# Patient Record
Sex: Female | Born: 1950 | Race: White | Hispanic: No | Marital: Single | Smoking: Former smoker
Health system: Southern US, Community
[De-identification: ages and names within clinical notes are randomized; demographics above are authoritative.]

## PROBLEM LIST (undated history)

## (undated) DIAGNOSIS — F419 Anxiety disorder, unspecified: Secondary | ICD-10-CM

## (undated) DIAGNOSIS — E785 Hyperlipidemia, unspecified: Secondary | ICD-10-CM

## (undated) DIAGNOSIS — E119 Type 2 diabetes mellitus without complications: Secondary | ICD-10-CM

## (undated) DIAGNOSIS — R011 Cardiac murmur, unspecified: Secondary | ICD-10-CM

## (undated) DIAGNOSIS — A6920 Lyme disease, unspecified: Secondary | ICD-10-CM

## (undated) DIAGNOSIS — N189 Chronic kidney disease, unspecified: Secondary | ICD-10-CM

## (undated) HISTORY — DX: Type 2 diabetes mellitus without complications: E11.9

## (undated) HISTORY — PX: TONSILLECTOMY: SUR1361

## (undated) HISTORY — PX: DILATION AND CURETTAGE OF UTERUS: SHX78

## (undated) HISTORY — DX: Chronic kidney disease, unspecified: N18.9

## (undated) HISTORY — PX: CATARACT EXTRACTION: SUR2

## (undated) HISTORY — PX: OTHER SURGICAL HISTORY: SHX169

## (undated) HISTORY — DX: Hyperlipidemia, unspecified: E78.5

## (undated) HISTORY — DX: Lyme disease, unspecified: A69.20

## (undated) HISTORY — PX: NASAL SEPTUM SURGERY: SHX37

---

## 1998-05-18 ENCOUNTER — Other Ambulatory Visit: Admission: RE | Admit: 1998-05-18 | Discharge: 1998-05-18 | Payer: Self-pay | Admitting: Obstetrics and Gynecology

## 1999-06-22 ENCOUNTER — Other Ambulatory Visit: Admission: RE | Admit: 1999-06-22 | Discharge: 1999-06-22 | Payer: Self-pay | Admitting: Obstetrics and Gynecology

## 2000-06-26 ENCOUNTER — Other Ambulatory Visit: Admission: RE | Admit: 2000-06-26 | Discharge: 2000-06-26 | Payer: Self-pay | Admitting: Obstetrics and Gynecology

## 2001-02-17 ENCOUNTER — Emergency Department (HOSPITAL_COMMUNITY): Admission: EM | Admit: 2001-02-17 | Discharge: 2001-02-17 | Payer: Self-pay | Admitting: *Deleted

## 2001-06-27 ENCOUNTER — Other Ambulatory Visit: Admission: RE | Admit: 2001-06-27 | Discharge: 2001-06-27 | Payer: Self-pay | Admitting: Obstetrics and Gynecology

## 2002-07-16 ENCOUNTER — Other Ambulatory Visit: Admission: RE | Admit: 2002-07-16 | Discharge: 2002-07-16 | Payer: Self-pay | Admitting: Obstetrics and Gynecology

## 2003-07-24 ENCOUNTER — Other Ambulatory Visit: Admission: RE | Admit: 2003-07-24 | Discharge: 2003-07-24 | Payer: Self-pay | Admitting: Obstetrics and Gynecology

## 2003-11-10 ENCOUNTER — Ambulatory Visit (HOSPITAL_COMMUNITY): Admission: RE | Admit: 2003-11-10 | Discharge: 2003-11-10 | Payer: Self-pay | Admitting: Obstetrics and Gynecology

## 2003-12-16 ENCOUNTER — Ambulatory Visit (HOSPITAL_COMMUNITY): Admission: RE | Admit: 2003-12-16 | Discharge: 2003-12-16 | Payer: Self-pay | Admitting: Obstetrics and Gynecology

## 2004-01-29 ENCOUNTER — Ambulatory Visit (HOSPITAL_COMMUNITY): Admission: RE | Admit: 2004-01-29 | Discharge: 2004-01-29 | Payer: Self-pay | Admitting: Otolaryngology

## 2004-01-29 ENCOUNTER — Ambulatory Visit (HOSPITAL_BASED_OUTPATIENT_CLINIC_OR_DEPARTMENT_OTHER): Admission: RE | Admit: 2004-01-29 | Discharge: 2004-01-29 | Payer: Self-pay | Admitting: Otolaryngology

## 2004-06-10 ENCOUNTER — Ambulatory Visit (HOSPITAL_COMMUNITY): Admission: RE | Admit: 2004-06-10 | Discharge: 2004-06-10 | Payer: Self-pay | Admitting: Obstetrics and Gynecology

## 2004-09-28 ENCOUNTER — Other Ambulatory Visit: Admission: RE | Admit: 2004-09-28 | Discharge: 2004-09-28 | Payer: Self-pay | Admitting: Obstetrics and Gynecology

## 2006-02-19 ENCOUNTER — Emergency Department (HOSPITAL_COMMUNITY): Admission: EM | Admit: 2006-02-19 | Discharge: 2006-02-19 | Payer: Self-pay | Admitting: Emergency Medicine

## 2008-01-03 ENCOUNTER — Ambulatory Visit: Payer: Self-pay | Admitting: Orthopedic Surgery

## 2008-01-03 DIAGNOSIS — S82409A Unspecified fracture of shaft of unspecified fibula, initial encounter for closed fracture: Secondary | ICD-10-CM | POA: Insufficient documentation

## 2008-01-04 ENCOUNTER — Telehealth: Payer: Self-pay | Admitting: Orthopedic Surgery

## 2008-01-07 ENCOUNTER — Telehealth: Payer: Self-pay | Admitting: Orthopedic Surgery

## 2008-01-15 ENCOUNTER — Ambulatory Visit: Payer: Self-pay | Admitting: Orthopedic Surgery

## 2008-01-17 ENCOUNTER — Telehealth: Payer: Self-pay | Admitting: Orthopedic Surgery

## 2008-01-17 ENCOUNTER — Encounter: Payer: Self-pay | Admitting: Orthopedic Surgery

## 2008-01-24 ENCOUNTER — Encounter: Payer: Self-pay | Admitting: Orthopedic Surgery

## 2008-02-18 ENCOUNTER — Ambulatory Visit: Payer: Self-pay | Admitting: Orthopedic Surgery

## 2008-03-18 ENCOUNTER — Ambulatory Visit: Payer: Self-pay | Admitting: Orthopedic Surgery

## 2009-09-25 ENCOUNTER — Encounter: Admission: RE | Admit: 2009-09-25 | Discharge: 2009-09-25 | Payer: Self-pay | Admitting: Obstetrics and Gynecology

## 2010-07-02 NOTE — Op Note (Signed)
Betty Gonzalez, Betty Gonzalez                ACCOUNT NO.:  [id number]   MEDICAL RECORD NO.:  [id number]          PATIENT TYPE:  AMB   LOCATION:  SDC                           FACILITY:  [facility]   PHYSICIAN:  Malva Limes, M.D.    DATE OF BIRTH:  03/27/1950   DATE OF PROCEDURE:  12/16/2003  DATE OF DISCHARGE:                                 OPERATIVE REPORT   PREOPERATIVE DIAGNOSES:  1.  Pelvic pain.  2.  Mass seen on ultrasound in cervical region.  3.  History of postmenopausal bleeding.   POSTOPERATIVE DIAGNOSES:  1.  Pelvic pain.  2.  Mass seen on ultrasound in cervical region.  3.  History of postmenopausal bleeding.   PROCEDURE:  1.  Dilation and curettage.  2.  Hysteroscopy.   SURGEON:  Malva Limes, M.D.   ANESTHESIA:  MAC.   ANTIBIOTICS:  Ancef 1 g.   ESTIMATED BLOOD LOSS:  Minimal.   COMPLICATIONS:  None.   SPECIMENS:  None.   DESCRIPTION OF PROCEDURE:  The patient was taken to the operating room where  she was placed in dorsal lithotomy position, MAC anesthesia was  administered. She was then prepped with Hibiclens and draped in the usual  fashion for this procedure.  A sterile speculum was placed in the vagina, 20  mL of 1% lidocaine was used for paracervical block. The cervix was then  serially dilated to a 77 [place]. The uterus was sounded to 7 cm. The  hysteroscope was advanced to the endocervical canal which appeared to be  normal. On entering the uterine cavity, both ostia were very easily seen,  there was no evidence of any submucosal fibroids. The endometrial lining was  very thin and atrophic. At this point, the hysteroscope was removed, sharp  curettage was performed with no tissue being obtained.  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  This is believed to be what the mass is on  ultrasound.  The patient tolerated the procedure well and she was taken to  the  recovery room in stable condition. She will be discharged home. She will  followup in the office in four weeks.     Mark   MA/MEDQ  D:  12/16/2003  T:  12/16/2003  Job:  [id number]

## 2010-07-02 NOTE — Op Note (Signed)
NAME:  Betty Gonzalez, Betty Gonzalez                ACCOUNT NO.:  [id number]   MEDICAL RECORD NO.:  [id number]          PATIENT TYPE:  AMB   LOCATION:  DSC                          FACILITY:  [facility]   PHYSICIAN:  Jefry H. Pollyann Kennedy, MD     DATE OF BIRTH:  31-May-1950   DATE OF PROCEDURE:  01/29/2004  DATE OF DISCHARGE:                                 OPERATIVE REPORT   PREOPERATIVE DIAGNOSES:  1.  Nasal septal deviation.  2.  Inferior turbinate hypertrophy.   POSTOPERATIVE DIAGNOSES:  1.  Nasal septal deviation.  2.  Inferior turbinate hypertrophy.   OPERATION PERFORMED:  1.  Nasal septoplasty.  2.  Submucous resection of inferior turbinates bilaterally.   SURGEON:  Jefry H. Pollyann Kennedy, MD   ANESTHESIA:  General endotracheal.   COMPLICATIONS:  None.   REFERRING PHYSICIAN:  Ladell Pier, M.D.   ESTIMATED BLOOD LOSS:  50 mL.   FINDINGS:  Caudal cartilaginous septal deviation with protrusion of the  caudal extent of the quadrangular cartilage into the right nasal cavity and  corresponding rightward ethmoid plate deflection toward the right.  There  was spurring along the maxillary crest and anterior vomer on the left side.  The inferior turbinates were elongated and medially displaced causing  partial obstruction as well.   INDICATIONS FOR PROCEDURE:  The patient is a 60 year old lady with a long  history of chronic nasal obstruction.  The risks, benefits, alternatives and  complications of the procedure were explained to the patient, who seemed to  understand and agreed to surgery.   DESCRIPTION OF PROCEDURE:  The patient was taken to the operating room and  placed on the operating table in the supine position.  Following induction  of general endotracheal anesthesia, the patient was prepped and draped in a  standard fashion.  Oxymetazoline spray was used preoperatively in the nasal  cavities.  1% Xylocaine with epinephrine was infiltrated into the septum and  columella and the inferior  turbinates bilaterally.  A total of 5 mm was  injected.  Afrin soaked pledgets were placed in nasal cavities bilaterally.   (1)  Nasal septoplasty.  A left hemitransfixion incision was created with a  15 scalpel and used to initiate a mucoperichondrial flap down the left side.  The bony cartilaginous junction was divided and a similar flap was developed  down the right side.  There was some bleeding from posterior septal mucosa  on the right side that was treated with an Afrin soaked pledget for several  minutes.  The superior attachment of ethmoid plate was resected using open  Jansen-Middleton rongeur and the posterior and inferior attachments were  taken down with Takahashi forceps and large fragments of the deflected  ethmoid plate were resected.  The posterior one third of the quadrangular  cartilage was resected as well and this was also deflected towards the right  side.  The columellar pocket was dissected to retrieve the caudal end of the  quadrangular cartilage and triangular strip was resected  inferiorly about 4  mm in length at its widest point using scissors.  The remaining  caudal  cartilage was replaced back into the columellar pocket and was in much  better position. The [employer] was also used on the caudal cartilage  and soften and release some of the bend.  The septal flaps were then quilted  with 4-0 plain gut and the incision was reapproximated with interrupted  chromic suture.   (2)  Submucous resection of inferior turbinates.  The leading edge of the  inferior turbinates was incised with a 14 scalpel bilaterally.  A Freer  elevator was used to elevate mucosa off the bony turbinates and fragments of  bone were resected bilaterally.  The turbinate remnants were outfractured  with a Therapist, nutritional.  The inferior meatus airways were significantly  increased in size following this maneuver.  The nasal cavities and  nasopharynx were suctioned of blood and  secretions and the nasal cavities  were packed with rolled up Telfa coated with bacitracin ointment.  The  pharynx was suctioned of blood under direction visualization as well.  The  patient was then awakened, extubated and transferred to recovery in stable  condition.      Jefr   JHR/MEDQ  D:  01/29/2004  T:  01/29/2004  Job:  [id number]   cc:   Ladell Pier, M.D.  [address]-Internal Medicine Resident  [place], [place] [address]  Fax: [phone]

## 2011-10-12 ENCOUNTER — Other Ambulatory Visit: Payer: Self-pay | Admitting: Obstetrics and Gynecology

## 2011-10-12 DIAGNOSIS — R234 Changes in skin texture: Secondary | ICD-10-CM

## 2011-10-13 ENCOUNTER — Ambulatory Visit
Admission: RE | Admit: 2011-10-13 | Discharge: 2011-10-13 | Disposition: A | Discharge status: Home / Self Care | Payer: No Typology Code available for payment source | Source: Ambulatory Visit | Attending: Obstetrics and Gynecology | Admitting: Obstetrics and Gynecology

## 2011-10-13 DIAGNOSIS — R234 Changes in skin texture: Secondary | ICD-10-CM

## 2013-06-26 DIAGNOSIS — M810 Age-related osteoporosis without current pathological fracture: Secondary | ICD-10-CM | POA: Insufficient documentation

## 2013-06-26 DIAGNOSIS — L570 Actinic keratosis: Secondary | ICD-10-CM | POA: Insufficient documentation

## 2013-06-26 DIAGNOSIS — J309 Allergic rhinitis, unspecified: Secondary | ICD-10-CM | POA: Insufficient documentation

## 2013-06-26 DIAGNOSIS — R682 Dry mouth, unspecified: Secondary | ICD-10-CM | POA: Insufficient documentation

## 2013-06-26 DIAGNOSIS — F4322 Adjustment disorder with anxiety: Secondary | ICD-10-CM | POA: Insufficient documentation

## 2013-06-26 DIAGNOSIS — IMO0002 Reserved for concepts with insufficient information to code with codable children: Secondary | ICD-10-CM | POA: Insufficient documentation

## 2013-06-26 DIAGNOSIS — E785 Hyperlipidemia, unspecified: Secondary | ICD-10-CM | POA: Insufficient documentation

## 2013-06-26 DIAGNOSIS — H8102 Meniere's disease, left ear: Secondary | ICD-10-CM | POA: Insufficient documentation

## 2013-06-26 DIAGNOSIS — R339 Retention of urine, unspecified: Secondary | ICD-10-CM | POA: Insufficient documentation

## 2013-06-26 DIAGNOSIS — R5383 Other fatigue: Secondary | ICD-10-CM | POA: Insufficient documentation

## 2013-06-26 DIAGNOSIS — N200 Calculus of kidney: Secondary | ICD-10-CM | POA: Insufficient documentation

## 2013-06-26 DIAGNOSIS — Q6432 Congenital stricture of urethra: Secondary | ICD-10-CM | POA: Insufficient documentation

## 2013-06-26 DIAGNOSIS — J019 Acute sinusitis, unspecified: Secondary | ICD-10-CM | POA: Insufficient documentation

## 2013-06-26 DIAGNOSIS — R739 Hyperglycemia, unspecified: Secondary | ICD-10-CM | POA: Insufficient documentation

## 2013-06-26 DIAGNOSIS — E119 Type 2 diabetes mellitus without complications: Secondary | ICD-10-CM | POA: Insufficient documentation

## 2014-08-12 ENCOUNTER — Ambulatory Visit (INDEPENDENT_AMBULATORY_CARE_PROVIDER_SITE_OTHER): Payer: Medicare Other | Admitting: Internal Medicine

## 2014-08-12 ENCOUNTER — Ambulatory Visit (INDEPENDENT_AMBULATORY_CARE_PROVIDER_SITE_OTHER): Payer: Medicare Other

## 2014-08-12 ENCOUNTER — Encounter: Payer: Self-pay | Admitting: Internal Medicine

## 2014-08-12 DIAGNOSIS — R1032 Left lower quadrant pain: Secondary | ICD-10-CM

## 2014-08-12 LAB — POCT CBC
Granulocyte percent: 55 %G (ref 37–80)
HEMATOCRIT: 41.2 % (ref 37.7–47.9)
HEMOGLOBIN: 13.2 g/dL (ref 12.2–16.2)
Lymph, poc: 3.4 (ref 0.6–3.4)
MCH: 28.6 pg (ref 27–31.2)
MCHC: 32.1 g/dL (ref 31.8–35.4)
MCV: 88.9 fL (ref 80–97)
MID (cbc): 0.9 (ref 0–0.9)
MPV: 8.4 fL (ref 0–99.8)
POC Granulocyte: 5.3 (ref 2–6.9)
POC LYMPH PERCENT: 35.3 %L (ref 10–50)
POC MID %: 9.7 % (ref 0–12)
Platelet Count, POC: 250 10*3/uL (ref 142–424)
RBC: 4.63 M/uL (ref 4.04–5.48)
RDW, POC: 13.4 %
WBC: 9.6 10*3/uL (ref 4.6–10.2)

## 2014-08-12 LAB — POCT URINALYSIS DIPSTICK
Bilirubin, UA: NEGATIVE
Blood, UA: NEGATIVE
GLUCOSE UA: NEGATIVE
Ketones, UA: NEGATIVE
Leukocytes, UA: NEGATIVE
NITRITE UA: NEGATIVE
Protein, UA: NEGATIVE
Spec Grav, UA: 1.015
UROBILINOGEN UA: 0.2
pH, UA: 5.5

## 2014-08-12 LAB — POCT UA - MICROSCOPIC ONLY
Casts, Ur, LPF, POC: NEGATIVE
Crystals, Ur, HPF, POC: NEGATIVE
MUCUS UA: NEGATIVE
YEAST UA: NEGATIVE

## 2014-08-12 MED ORDER — POLYETHYLENE GLYCOL 3350 17 GM/SCOOP PO POWD: 17.0000 g | Freq: Every day | ORAL | Status: DC

## 2014-08-12 NOTE — Progress Notes (Signed)
08/12/2014 at 6:35 PM  Betty Gonzalez / DOB: 05-Apr-1950 / MRN: [id number]  The patient has FX CLOSED FIBULA NOS on her problem list.  SUBJECTIVE  Chief complaint: Insect Bite; Abdominal Pain; and Back Pain  Patient here for left lower quadrant pain that started yesterday and the pain is moderate to severe.  Reports she could not lye on her left side last night due to the pain. She has had some mild nausea with the pain.   She denied diarrhea and denies hematochezia and melena.  Reports she has been taking doxycycline for 7 days now for a tic bite.  Is able to tolerate po liquids and solids.  She has not had a colonoscopy in over 15 years.    She  has a past medical history of Chronic kidney disease.    Medications reviewed and updated by myself where necessary, and exist elsewhere in the encounter.   Betty Gonzalez is allergic to codeine; penicillins; and sulfonamide derivatives. She  reports that she has quit smoking. She does not have any smokeless tobacco history on file. She reports that she does not drink alcohol or use illicit drugs. She  has no sexual activity history on file. The patient  has no past surgical history on file.  Her family history includes Dementia in her mother.  Review of Systems  Constitutional: Negative for fever.  Cardiovascular: Negative for chest pain.  Skin: Negative for itching and rash.  Neurological: Negative for headaches.    OBJECTIVE  Her  height is 5' 7.5" (1.715 m) and weight is 173 lb 3.2 oz (78.563 kg). Her oral temperature is 97.5 F (36.4 C). Her blood pressure is 140/78 and her pulse is 81. Her respiration is 18 and oxygen saturation is 98%.  The patient's body mass index is 26.71 kg/(m^2).  Physical Exam  Constitutional: She is oriented to person, place, and time. She appears well-developed and well-nourished. No distress.  Cardiovascular: Normal rate and regular rhythm.   Respiratory: Effort normal and breath sounds normal. No respiratory  distress.  GI: Soft. She exhibits no mass. There is tenderness in the left lower quadrant. There is no rigidity, no rebound, no guarding, no CVA tenderness, no tenderness at McBurney's point and negative Murphy's sign.  Musculoskeletal: Normal range of motion.  Neurological: She is alert and oriented to person, place, and time. No cranial nerve deficit.  Skin: She is not diaphoretic.    Results for orders placed or performed in visit on 08/12/14 (from the past 24 hour(s))  POCT urinalysis dipstick     Status: None   Collection Time: 08/12/14  6:12 PM  Result Value Ref Range   Color, UA yellow    Clarity, UA clear    Glucose, UA neg    Bilirubin, UA neg    Ketones, UA neg    Spec Grav, UA 1.015    Blood, UA neg    pH, UA 5.5    Protein, UA neg    Urobilinogen, UA 0.2    Nitrite, UA neg    Leukocytes, UA Negative Negative  POCT UA - Microscopic Only     Status: None   Collection Time: 08/12/14  6:12 PM  Result Value Ref Range   WBC, Ur, HPF, POC 0-1    RBC, urine, microscopic 0-2    Bacteria, U Microscopic trace    Mucus, UA neg    Epithelial cells, urine per micros 1-3    Crystals, Ur, HPF, POC neg  Casts, Ur, LPF, POC neg    Yeast, UA neg   POCT CBC     Status: None   Collection Time: 08/12/14  6:25 PM  Result Value Ref Range   WBC 9.6 4.6 - 10.2 K/uL   Lymph, poc 3.4 0.6 - 3.4   POC LYMPH PERCENT 35.3 10 - 50 %L   MID (cbc) 0.9 0 - 0.9   POC MID % 9.7 0 - 12 %M   POC Granulocyte 5.3 2 - 6.9   Granulocyte percent 55.0 37 - 80 %G   RBC 4.63 4.04 - 5.48 M/uL   Hemoglobin 13.2 12.2 - 16.2 g/dL   HCT, POC 16.1 09.6 - 47.9 %   MCV 88.9 80 - 97 fL   MCH, POC 28.6 27 - 31.2 pg   MCHC 32.1 31.8 - 35.4 g/dL   RDW, POC 04.5 %   Platelet Count, POC 250 142 - 424 K/uL   MPV 8.4 0 - 99.8 fL   [facility] reading (PRIMARY) by  Dr. Merla Riches: Negative for free air.  Moderate stool burden, otherwise negative.   ASSESSMENT & PLAN  Betty Gonzalez was seen today for insect bite, abdominal  pain and back pain.  Diagnoses and all orders for this visit:  Left lower quadrant pain: Workup within normal limits and CMPGFR and lipase pending.  C-diff unlikely given the absence of diarrhea.  Diverticulitis is possible however her labs do not point in that direction. Advised a clear liquid diet for 24 hours along with Miralax qid.  She is to follow up in the next few days, or go to the ED, if her symptoms do not improve or worsen.   Orders: -     POCT urinalysis dipstick -     POCT UA - Microscopic Only -     Lipase -     COMPLETE METABOLIC PANEL WITH GFR -     Cancel: POCT glycosylated hemoglobin (Hb A1C) -     POCT CBC -     DG Abd 2 Views; Future -     Hemoglobin A1c    The patient was advised to call or come back to clinic if she does not see an improvement in symptoms, or worsens with the above plan.   [name], MHS, PA-C [facility] 08/12/2014 6:35 PM  I have participated in the care of this patient with the Advanced Practice Provider and agree with Diagnosis and Plan as documented. Robert P. Merla Riches, M.D.

## 2014-08-12 NOTE — Patient Instructions (Signed)
Drink copious fluids.  Take Miralax 4 times daily until pain resolves.  Please consume a clear liquid diet for 24 hours.  Clear Liquid   Diet A clear liquid diet is a short-term diet that is prescribed to provide the necessary fluid and basic energy you need when you can have nothing else. The clear liquid diet consists of liquids or solids that will become liquid at room temperature. You should be able to see through the liquid. There are many reasons that you may be restricted to clear liquids, such as:  When you have a sudden-onset (acute) condition that occurs before or after surgery.  To help your body slowly get adjusted to food again after a long period when you were unable to have food.  Replacement of fluids when you have a diarrheal disease.  When you are going to have certain exams, such as a colonoscopy, in which instruments are inserted inside your body to look at parts of your digestive system. WHAT CAN I HAVE? A clear liquid diet does not provide all the nutrients you need. It is important to choose a variety of the following items to get as many nutrients as possible:  Vegetable juices that do not have pulp.  Fruit juices and fruit drinks that do not have pulp.  Coffee (regular or decaffeinated), tea, or soda at the discretion of your health care provider.  Clear bouillon, broth, or strained broth-based soups.  High-protein and flavored gelatins.  Sugar or honey.  Ices or frozen ice pops that do not contain milk. If you are not sure whether you can have certain items, you should ask your health care provider. You may also ask your health care provider if there are any other clear liquid options. Document Released: 01/31/2005 Document Revised: 02/05/2013 Document Reviewed: 12/28/2012 [facility] Patient Information 2015 [employer], [place]. This information is not intended to replace advice given to you by your health care provider. Make sure you discuss any questions you have  with your health care provider.

## 2014-08-13 LAB — HEMOGLOBIN A1C
Hgb A1c MFr Bld: 6.7 % — ABNORMAL HIGH (ref <5.7)
Mean Plasma Glucose: 146 mg/dL — ABNORMAL HIGH (ref <117)

## 2014-08-13 LAB — COMPLETE METABOLIC PANEL WITH GFR
ALBUMIN: 4.1 g/dL (ref 3.5–5.2)
ALK PHOS: 100 U/L (ref 39–117)
ALT: 13 U/L (ref 0–35)
AST: 13 U/L (ref 0–37)
BUN: 15 mg/dL (ref 6–23)
CALCIUM: 9 mg/dL (ref 8.4–10.5)
CHLORIDE: 105 meq/L (ref 96–112)
CO2: 27 meq/L (ref 19–32)
Creat: 0.83 mg/dL (ref 0.50–1.10)
GFR, EST NON AFRICAN AMERICAN: 74 mL/min
GFR, Est African American: 85 mL/min
GLUCOSE: 107 mg/dL — AB (ref 70–99)
POTASSIUM: 4 meq/L (ref 3.5–5.3)
SODIUM: 141 meq/L (ref 135–145)
TOTAL PROTEIN: 7 g/dL (ref 6.0–8.3)
Total Bilirubin: 0.4 mg/dL (ref 0.2–1.2)

## 2014-08-13 LAB — LIPASE: LIPASE: 37 U/L (ref 0–75)

## 2014-10-12 ENCOUNTER — Encounter: Payer: Self-pay | Admitting: Family Medicine

## 2014-10-12 ENCOUNTER — Telehealth: Payer: Self-pay | Admitting: Family Medicine

## 2014-10-12 NOTE — Telephone Encounter (Signed)
lmom of results and Barth Kirks message  Per patients request also i sent a copy of labs

## 2014-10-16 ENCOUNTER — Ambulatory Visit (INDEPENDENT_AMBULATORY_CARE_PROVIDER_SITE_OTHER): Payer: Medicare Other | Admitting: Family Medicine

## 2014-10-16 VITALS — BP 122/68 | HR 77 | Temp 99.7°F | Resp 16 | Ht 67.5 in | Wt 166.0 lb

## 2014-10-16 DIAGNOSIS — K5792 Diverticulitis of intestine, part unspecified, without perforation or abscess without bleeding: Secondary | ICD-10-CM

## 2014-10-16 DIAGNOSIS — D72829 Elevated white blood cell count, unspecified: Secondary | ICD-10-CM

## 2014-10-16 DIAGNOSIS — R10814 Left lower quadrant abdominal tenderness: Secondary | ICD-10-CM | POA: Diagnosis not present

## 2014-10-16 LAB — POCT UA - MICROSCOPIC ONLY
CASTS, UR, LPF, POC: NEGATIVE
CRYSTALS, UR, HPF, POC: NEGATIVE
MUCUS UA: NEGATIVE
Yeast, UA: NEGATIVE

## 2014-10-16 LAB — POCT URINALYSIS DIPSTICK
BILIRUBIN UA: NEGATIVE
GLUCOSE UA: NEGATIVE
Ketones, UA: NEGATIVE
Leukocytes, UA: NEGATIVE
NITRITE UA: NEGATIVE
Protein, UA: NEGATIVE
Spec Grav, UA: 1.015
UROBILINOGEN UA: 0.2
pH, UA: 5.5

## 2014-10-16 LAB — POCT CBC
GRANULOCYTE PERCENT: 71.1 % (ref 37–80)
HEMATOCRIT: 42.5 % (ref 37.7–47.9)
HEMOGLOBIN: 13.8 g/dL (ref 12.2–16.2)
Lymph, poc: 2.6 (ref 0.6–3.4)
MCH: 28.6 pg (ref 27–31.2)
MCHC: 32.5 g/dL (ref 31.8–35.4)
MCV: 87.9 fL (ref 80–97)
MID (cbc): 1 — AB (ref 0–0.9)
MPV: 8.3 fL (ref 0–99.8)
POC GRANULOCYTE: 8.7 — AB (ref 2–6.9)
POC LYMPH PERCENT: 21.1 %L (ref 10–50)
POC MID %: 7.8 % (ref 0–12)
Platelet Count, POC: 293 10*3/uL (ref 142–424)
RBC: 4.83 M/uL (ref 4.04–5.48)
RDW, POC: 13 %
WBC: 12.3 10*3/uL — AB (ref 4.6–10.2)

## 2014-10-16 MED ORDER — CIPROFLOXACIN HCL 500 MG PO TABS: 500.0000 mg | ORAL_TABLET | Freq: Two times a day (BID) | ORAL | Status: AC

## 2014-10-16 MED ORDER — METRONIDAZOLE 500 MG PO TABS: 500.0000 mg | ORAL_TABLET | Freq: Two times a day (BID) | ORAL | Status: AC

## 2014-10-16 NOTE — Progress Notes (Signed)
[facility] [address], [place] [address] [phone]  Date:  10/16/2014   Name:  Betty Gonzalez   DOB:  [birth year]   MRN:  [id number]  PCP:  Pamelia Hoit, MD    Chief Complaint: Urinary Tract Infection   History of Present Illness:  This is a 64 y.o. female with PMH DM2 and allergic rhinitis who is presenting with a possible UTI with onset 4 days ago. She is having suprapubic abdominal pain described as burning. She has had some vaginal discharge that is normal for her and clear. She has not been feeling well. Temp here of 99.7. She denies dysuria, urinary frequency, hematuria, n/v, new back pain. She is not sexually active. She has not taken anything for her symptoms. Last UTI 3-4 years ago. She has a urologist she sees every so often to get her bladder stem stretched. She has been taking miralax since her symptoms started d/t feeling constipated. Her stools have been smaller - "pencil thin". She states "I had a blow out today". She denies bloody stool. She states it is normal for her to be constipated when she is in pain. She has her uterus and ovaries still. She has not had a colonoscopy since her 57s. She states she was avoiding another colonoscopy because the first one was so painful but she knows she needs to get it done.   Review of Systems:  Review of Systems See HPI  Patient Active Problem List   Diagnosis Date Noted  . FX CLOSED FIBULA NOS 01/03/2008    Prior to Admission medications   Medication Sig Start Date End Date Taking? Authorizing Provider  fluticasone (FLONASE) 50 MCG/ACT nasal spray Place into both nostrils daily.   Yes Historical Provider, MD  LORazepam (ATIVAN) 1 MG tablet Take 1 mg by mouth every 8 (eight) hours.   Yes Historical Provider, MD  metFORMIN (GLUCOPHAGE) 500 MG tablet Take by mouth once.   Yes Historical Provider, MD  polyethylene glycol powder (GLYCOLAX/MIRALAX) powder Take 17 g by mouth daily. 08/12/14  Yes Ofilia Neas, PA-C    Allergies  Allergen Reactions  . Codeine   . Penicillins   . Sulfonamide Derivatives     History reviewed. No pertinent past surgical history.  Social History  Substance Use Topics  . Smoking status: Former Games developer  . Smokeless tobacco: None  . Alcohol Use: No    Family History  Problem Relation Age of Onset  . Dementia Mother     Medication list has been reviewed and updated.  Physical Examination:  Physical Exam  Constitutional: She is oriented to person, place, and time. She appears well-developed and well-nourished. No distress.  HENT:  Head: Normocephalic and atraumatic.  Right Ear: Hearing normal.  Left Ear: Hearing normal.  Nose: Nose normal.  Mouth/Throat: Uvula is midline, oropharynx is clear and moist and mucous membranes are normal.  Eyes: Conjunctivae and lids are normal. Right eye exhibits no discharge. Left eye exhibits no discharge. No scleral icterus.  Cardiovascular: Normal rate, regular rhythm, normal heart sounds and normal pulses.   No murmur heard. Pulmonary/Chest: Effort normal and breath sounds normal. No respiratory distress. She has no wheezes. She has no rhonchi. She has no rales.  Abdominal: Soft. Normal appearance. There is tenderness. There is no CVA tenderness.  Significant LLQ tenderness, moderate suprapubic tenderness. No RLQ or upper abdomen tenderness. Guarding present. No rebound.  Musculoskeletal: Normal range of motion.  Lymphadenopathy:  Head (right side): No submental, no submandibular and no tonsillar adenopathy present.       Head (left side): No submental, no submandibular and no tonsillar adenopathy present.    She has no cervical adenopathy.  Neurological: She is alert and oriented to person, place, and time.  Skin: Skin is warm, dry and intact. No lesion and no rash noted.  Psychiatric: She has a normal mood and affect. Her speech is normal and behavior is normal. Thought content normal.   BP 122/68  mmHg  Pulse 77  Temp(Src) 99.7 F (37.6 C) (Oral)  Resp 16  Ht 5' 7.5" (1.715 m)  Wt 166 lb (75.297 kg)  BMI 25.60 kg/m2  SpO2 92%  Results for orders placed or performed in visit on 10/16/14  POCT UA - Microscopic Only  Result Value Ref Range   WBC, Ur, HPF, POC 10-15    RBC, urine, microscopic 2-4    Bacteria, U Microscopic 2+    Mucus, UA neg    Epithelial cells, urine per micros 5-10    Crystals, Ur, HPF, POC neg    Casts, Ur, LPF, POC neg    Yeast, UA neg   POCT urinalysis dipstick  Result Value Ref Range   Color, UA yellow    Clarity, UA cloudy    Glucose, UA neg    Bilirubin, UA neg    Ketones, UA neg    Spec Grav, UA 1.015    Blood, UA trace    pH, UA 5.5    Protein, UA neg    Urobilinogen, UA 0.2    Nitrite, UA neg    Leukocytes, UA Negative Negative  POCT CBC  Result Value Ref Range   WBC 12.3 (A) 4.6 - 10.2 K/uL   Lymph, poc 2.6 0.6 - 3.4   POC LYMPH PERCENT 21.1 10 - 50 %L   MID (cbc) 1.0 (A) 0 - 0.9   POC MID % 7.8 0 - 12 %M   POC Granulocyte 8.7 (A) 2 - 6.9   Granulocyte percent 71.1 37 - 80 %G   RBC 4.83 4.04 - 5.48 M/uL   Hemoglobin 13.8 12.2 - 16.2 g/dL   HCT, POC 16.1 09.6 - 47.9 %   MCV 87.9 80 - 97 fL   MCH, POC 28.6 27 - 31.2 pg   MCHC 32.5 31.8 - 35.4 g/dL   RDW, POC 04.5 %   Platelet Count, POC 293 142 - 424 K/uL   MPV 8.3 0 - 99.8 fL   Assessment and Plan:  1. Diverticulitis of intestine without perforation or abscess without bleeding 2. LLQ abdominal tenderness 3. Leukocytosis UA with mod leuks, otherwise normal. CBC with wbc 12.3. With LLQ, low grade fever, absence of other UTI sx -- I think this is diverticulitis. CMP and urine culture pending. Will treat with cipro and metronidazole x 10 days. Liquid/soft diet for next 2-3 days. If sx improved on day 3 can advance diet. She will return for follow up with [name] on 9/7. If she is not improved in 3 days or if symptoms worsen at any time, she will call and we will set up a CT  scan. Pt has not had a colonoscopy in 20 years -- when recovered will need to refer for colonoscopy. Counseled on need for high fiber diet for prevention. - ciprofloxacin (CIPRO) 500 MG tablet; Take 1 tablet (500 mg total) by mouth 2 (two) times daily.  Dispense: 20 tablet; Refill: 0 - metroNIDAZOLE (FLAGYL)  500 MG tablet; Take 1 tablet (500 mg total) by mouth 2 (two) times daily.  Dispense: 20 tablet; Refill: 0 - POCT UA - Microscopic Only - POCT urinalysis dipstick - POCT CBC - Comprehensive metabolic panel - Urine culture   Roswell Miners. Dyke Brackett, MHS [facility]  10/16/2014

## 2014-10-16 NOTE — Patient Instructions (Signed)
Take antibiotics twice a day for 10 days. Eat a soft/liquid diet for next 2-3 days. If at 3 days you are starting to feel better, you may start advancing your diet. Prevention of diverticulitis with high fiber diet is key - metamucil is a good fiber supplement. Once recovered, you will need to be referred to GI for a colonoscopy. Return next week to follow up with [name]. If you are not feeling better in 3 days or if at any point your symptoms worsen, call and we will set you up for a CT scan  Diverticulitis Diverticulitis is inflammation or infection of small pouches in your colon that form when you have a condition called diverticulosis. The pouches in your colon are called diverticula. Your colon, or large intestine, is where water is absorbed and stool is formed. Complications of diverticulitis can include:  Bleeding.  Severe infection.  Severe pain.  Perforation of your colon.  Obstruction of your colon. CAUSES  Diverticulitis is caused by bacteria. Diverticulitis happens when stool becomes trapped in diverticula. This allows bacteria to grow in the diverticula, which can lead to inflammation and infection. RISK FACTORS People with diverticulosis are at risk for diverticulitis. Eating a diet that does not include enough fiber from fruits and vegetables may make diverticulitis more likely to develop. SYMPTOMS  Symptoms of diverticulitis may include:  Abdominal pain and tenderness. The pain is normally located on the left side of the abdomen, but may occur in other areas.  Fever and chills.  Bloating.  Cramping.  Nausea.  Vomiting.  Constipation.  Diarrhea.  Blood in your stool. DIAGNOSIS  Your health care provider will ask you about your medical history and do a physical exam. You may need to have tests done because many medical conditions can cause the same symptoms as diverticulitis. Tests may include:  Blood tests.  Urine tests.  Imaging tests of the  abdomen, including X-rays and CT scans. When your condition is under control, your health care provider may recommend that you have a colonoscopy. A colonoscopy can show how severe your diverticula are and whether something else is causing your symptoms. TREATMENT  Most cases of diverticulitis are mild and can be treated at home. Treatment may include:  Taking over-the-counter pain medicines.  Following a clear liquid diet.  Taking antibiotic medicines by mouth for 7-10 days. More severe cases may be treated at a hospital. Treatment may include:  Not eating or drinking.  Taking prescription pain medicine.  Receiving antibiotic medicines through an IV tube.  Receiving fluids and nutrition through an IV tube.  Surgery. HOME CARE INSTRUCTIONS   Follow your health care provider's instructions carefully.  Follow a full liquid diet or other diet as directed by your health care provider. After your symptoms improve, your health care provider may tell you to change your diet. He or she may recommend you eat a high-fiber diet. Fruits and vegetables are good sources of fiber. Fiber makes it easier to pass stool.  Take fiber supplements or probiotics as directed by your health care provider.  Only take medicines as directed by your health care provider.  Keep all your follow-up appointments. SEEK MEDICAL CARE IF:   Your pain does not improve.  You have a hard time eating food.  Your bowel movements do not return to normal. SEEK IMMEDIATE MEDICAL CARE IF:   Your pain becomes worse.  Your symptoms do not get better.  Your symptoms suddenly get worse.  You have a fever.  You have repeated vomiting.  You have bloody or black, tarry stools. MAKE SURE YOU:   Understand these instructions.  Will watch your condition.  Will get help right away if you are not doing well or get worse. Document Released: 11/10/2004 Document Revised: 02/05/2013 Document Reviewed:  12/26/2012 [facility] Patient Information 2015 [place], [place]. This information is not intended to replace advice given to you by your health care provider. Make sure you discuss any questions you have with your health care provider.

## 2014-10-17 LAB — COMPREHENSIVE METABOLIC PANEL
ALBUMIN: 4.3 g/dL (ref 3.6–5.1)
ALK PHOS: 93 U/L (ref 33–130)
ALT: 13 U/L (ref 6–29)
AST: 16 U/L (ref 10–35)
BUN: 14 mg/dL (ref 7–25)
CHLORIDE: 101 mmol/L (ref 98–110)
CO2: 24 mmol/L (ref 20–31)
CREATININE: 0.84 mg/dL (ref 0.50–0.99)
Calcium: 9.6 mg/dL (ref 8.6–10.4)
Glucose, Bld: 108 mg/dL — ABNORMAL HIGH (ref 65–99)
Potassium: 4.5 mmol/L (ref 3.5–5.3)
SODIUM: 139 mmol/L (ref 135–146)
TOTAL PROTEIN: 7.5 g/dL (ref 6.1–8.1)
Total Bilirubin: 0.6 mg/dL (ref 0.2–1.2)

## 2014-10-18 LAB — URINE CULTURE
COLONY COUNT: NO GROWTH
ORGANISM ID, BACTERIA: NO GROWTH

## 2014-10-21 ENCOUNTER — Telehealth: Payer: Self-pay | Admitting: Physician Assistant

## 2014-10-21 NOTE — Telephone Encounter (Signed)
Spoke with patient. She is doing much better. No longer having any pain. She is tolerating abx well. She started to introduce some solids into her diet yesterday 9/5 and did well with it. Only complaint is low energy from liquid/soft diet. She will follow up tomorrow 9/6 with [name], PA-C.

## 2014-10-22 ENCOUNTER — Encounter: Payer: Self-pay | Admitting: Physician Assistant

## 2014-10-22 ENCOUNTER — Ambulatory Visit (INDEPENDENT_AMBULATORY_CARE_PROVIDER_SITE_OTHER): Payer: Medicare Other | Admitting: Physician Assistant

## 2014-10-22 VITALS — BP 132/74 | HR 79 | Temp 98.3°F | Resp 16 | Ht 68.0 in | Wt 168.6 lb

## 2014-10-22 DIAGNOSIS — D72829 Elevated white blood cell count, unspecified: Secondary | ICD-10-CM | POA: Diagnosis not present

## 2014-10-22 DIAGNOSIS — Z09 Encounter for follow-up examination after completed treatment for conditions other than malignant neoplasm: Secondary | ICD-10-CM

## 2014-10-22 DIAGNOSIS — R109 Unspecified abdominal pain: Secondary | ICD-10-CM

## 2014-10-22 LAB — POCT CBC
Granulocyte percent: 57.8 %G (ref 37–80)
HCT, POC: 41.1 % (ref 37.7–47.9)
HEMOGLOBIN: 13 g/dL (ref 12.2–16.2)
LYMPH, POC: 2.4 (ref 0.6–3.4)
MCH, POC: 27.8 pg (ref 27–31.2)
MCHC: 31.6 g/dL — AB (ref 31.8–35.4)
MCV: 87.8 fL (ref 80–97)
MID (cbc): 0.6 (ref 0–0.9)
MPV: 8.1 fL (ref 0–99.8)
PLATELET COUNT, POC: 354 10*3/uL (ref 142–424)
POC Granulocyte: 4.2 (ref 2–6.9)
POC LYMPH PERCENT: 33.4 %L (ref 10–50)
POC MID %: 8.8 %M (ref 0–12)
RBC: 4.68 M/uL (ref 4.04–5.48)
RDW, POC: 12.4 %
WBC: 7.2 10*3/uL (ref 4.6–10.2)

## 2014-10-22 LAB — POCT URINALYSIS DIPSTICK
Bilirubin, UA: NEGATIVE
Glucose, UA: NEGATIVE
KETONES UA: NEGATIVE
NITRITE UA: NEGATIVE
PH UA: 7
PROTEIN UA: NEGATIVE
Spec Grav, UA: 1.02
UROBILINOGEN UA: 0.2

## 2014-10-22 LAB — POCT UA - MICROSCOPIC ONLY
CASTS, UR, LPF, POC: NEGATIVE
CRYSTALS, UR, HPF, POC: NEGATIVE
Mucus, UA: NEGATIVE
Yeast, UA: NEGATIVE

## 2014-10-22 NOTE — Progress Notes (Signed)
10/23/2014 at 12:21 PM  [facility] / DOB: 1950/12/05 / MRN: [id number]  The patient has FX CLOSED FIBULA NOS on her problem list.  SUBJECTIVE  Betty Gonzalez is a 64 y.o. well appearing female presenting for the chief complaint of follow up of diverticulitis.  She saw PA Bush on 10/16/14 and that HPI is as follows:  "This is a 64 y.o. female with PMH DM2 and allergic rhinitis who is presenting with a possible UTI with onset 4 days ago. She is having suprapubic abdominal pain described as burning. She has had some vaginal discharge that is normal for her and clear. She has not been feeling well. Temp here of 99.7. She denies dysuria, urinary frequency, hematuria, n/v, new back pain. She is not sexually active. She has not taken anything for her symptoms. Last UTI 3-4 years ago. She has a urologist she sees every so often to get her bladder stem stretched. She has been taking miralax since her symptoms started d/t feeling constipated. Her stools have been smaller - "pencil thin". She states "I had a blow out today". She denies bloody stool. She states it is normal for her to be constipated when she is in pain. She has her uterus and ovaries still. She has not had a colonoscopy since her 2s. She states she was avoiding another colonoscopy because the first one was so painful but she knows she needs to get it done."  Today she continues to improve, however feels weak.  Has been able to tolerate po liquids and but not much else.  She would like to be rechecked to ensure her illness is resolving.    She  has a past medical history of Chronic kidney disease.    Medications reviewed and updated by myself where necessary, and exist elsewhere in the encounter.   Betty Gonzalez is allergic to codeine; penicillins; and sulfonamide derivatives. She  reports that she has quit smoking. She does not have any smokeless tobacco history on file. She reports that she does not drink alcohol or use illicit drugs. She   has no sexual activity history on file. The patient  has no past surgical history on file.  Her family history includes Dementia in her mother.  Review of Systems  Constitutional: Negative for fever and chills.  Respiratory: Negative for shortness of breath.   Cardiovascular: Negative for chest pain.  Gastrointestinal: Negative for nausea and abdominal pain.  Genitourinary: Negative.  Negative for dysuria, urgency, frequency, hematuria and flank pain.  Skin: Negative for rash.  Neurological: Negative for dizziness and headaches.    OBJECTIVE  Her  height is  (1.727 m) and weight is 168 lb 9.6 oz (76.476 kg). Her oral temperature is 98.3 F (36.8 C). Her blood pressure is 132/74 and her pulse is 79. Her respiration is 16 and oxygen saturation is 95%.  The patient's body mass index is 25.64 kg/(m^2).  Physical Exam  Constitutional: She is oriented to person, place, and time. She appears well-developed and well-nourished. No distress.  Eyes: Pupils are equal, round, and reactive to light.  Cardiovascular: Normal rate, regular rhythm and normal heart sounds.   Respiratory: Effort normal and breath sounds normal. No respiratory distress.  GI: Soft. Bowel sounds are normal. She exhibits no distension and no mass. There is no tenderness. There is no rebound and no guarding.  Neurological: She is alert and oriented to person, place, and time.  Skin: Skin is warm and dry. She is  not diaphoretic.  Psychiatric: She has a normal mood and affect. Her behavior is normal. Judgment and thought content normal.    Results for orders placed or performed in visit on 10/22/14 (from the past 24 hour(s))  POCT urinalysis dipstick     Status: Abnormal   Collection Time: 10/22/14  5:17 PM  Result Value Ref Range   Color, UA Yellow    Clarity, UA Cloudy    Glucose, UA Negative    Bilirubin, UA Negative    Ketones, UA Negative    Spec Grav, UA 1.020    Blood, UA Trace-Intact    pH, UA 7.0     Protein, UA Negative    Urobilinogen, UA 0.2    Nitrite, UA Negative    Leukocytes, UA large (3+) (A) Negative  POCT UA - Microscopic Only     Status: None   Collection Time: 10/22/14  5:18 PM  Result Value Ref Range   WBC, Ur, HPF, POC 9-11    RBC, urine, microscopic 3-5    Bacteria, U Microscopic Trace    Mucus, UA Negative    Epithelial cells, urine per micros 1-3    Crystals, Ur, HPF, POC Negative    Casts, Ur, LPF, POC Negative    Yeast, UA Negative   POCT CBC     Status: Abnormal   Collection Time: 10/22/14  5:19 PM  Result Value Ref Range   WBC 7.2 4.6 - 10.2 K/uL   Lymph, poc 2.4 0.6 - 3.4   POC LYMPH PERCENT 33.4 10 - 50 %L   MID (cbc) 0.6 0 - 0.9   POC MID % 8.8 0 - 12 %M   POC Granulocyte 4.2 2 - 6.9   Granulocyte percent 57.8 37 - 80 %G   RBC 4.68 4.04 - 5.48 M/uL   Hemoglobin 13.0 12.2 - 16.2 g/dL   HCT, POC 16.1 09.6 - 47.9 %   MCV 87.8 80 - 97 fL   MCH, POC 27.8 27 - 31.2 pg   MCHC 31.6 (A) 31.8 - 35.4 g/dL   RDW, POC 04.5 %   Platelet Count, POC 354.0 142 - 424 K/uL   MPV 8.1 0 - 99.8 fL    ASSESSMENT & PLAN  Betty Gonzalez was seen today for abdominal pain.  Diagnoses and all orders for this visit:  Follow up: Advised BRAT diet and for patient to continue to push fluids.  She is still 3-4 lbs down from her pre diverticulitis weight.  Her exam and workup are reassuring.   -     POCT CBC -     POCT urinalysis dipstick -     POCT UA - Microscopic Only -     Comprehensive metabolic panel  Urine leuks: UTI highly doubtful as she is continued on Cipro and she is asymptomatic today.  Will culture to ensure.   -     Urine culture  Abdominal pain, unspecified abdominal location -     Urine culture    The patient was advised to call or come back to clinic if she does not see an improvement in symptoms, or worsens with the above plan.   [name], MHS, PA-C [facility] 10/23/2014 12:21 PM

## 2014-10-22 NOTE — Patient Instructions (Signed)

## 2014-10-23 LAB — COMPREHENSIVE METABOLIC PANEL
ALK PHOS: 90 U/L (ref 33–130)
ALT: 13 U/L (ref 6–29)
AST: 18 U/L (ref 10–35)
Albumin: 4 g/dL (ref 3.6–5.1)
BUN: 15 mg/dL (ref 7–25)
CALCIUM: 9.3 mg/dL (ref 8.6–10.4)
CHLORIDE: 103 mmol/L (ref 98–110)
CO2: 26 mmol/L (ref 20–31)
Creat: 1 mg/dL — ABNORMAL HIGH (ref 0.50–0.99)
Glucose, Bld: 101 mg/dL — ABNORMAL HIGH (ref 65–99)
POTASSIUM: 4.1 mmol/L (ref 3.5–5.3)
Sodium: 139 mmol/L (ref 135–146)
TOTAL PROTEIN: 7.1 g/dL (ref 6.1–8.1)
Total Bilirubin: 0.3 mg/dL (ref 0.2–1.2)

## 2014-10-23 LAB — URINE CULTURE

## 2014-11-06 ENCOUNTER — Ambulatory Visit (INDEPENDENT_AMBULATORY_CARE_PROVIDER_SITE_OTHER): Payer: Medicare Other | Admitting: Physician Assistant

## 2014-11-06 VITALS — BP 134/76 | HR 78 | Temp 98.3°F | Resp 18 | Ht 68.5 in | Wt 167.0 lb

## 2014-11-06 DIAGNOSIS — Z2089 Contact with and (suspected) exposure to other communicable diseases: Secondary | ICD-10-CM | POA: Diagnosis not present

## 2014-11-06 DIAGNOSIS — R319 Hematuria, unspecified: Secondary | ICD-10-CM | POA: Diagnosis not present

## 2014-11-06 DIAGNOSIS — N390 Urinary tract infection, site not specified: Secondary | ICD-10-CM

## 2014-11-06 DIAGNOSIS — Z299 Encounter for prophylactic measures, unspecified: Secondary | ICD-10-CM

## 2014-11-06 DIAGNOSIS — R300 Dysuria: Secondary | ICD-10-CM | POA: Diagnosis not present

## 2014-11-06 DIAGNOSIS — Z418 Encounter for other procedures for purposes other than remedying health state: Secondary | ICD-10-CM | POA: Diagnosis not present

## 2014-11-06 DIAGNOSIS — Z20818 Contact with and (suspected) exposure to other bacterial communicable diseases: Secondary | ICD-10-CM

## 2014-11-06 LAB — POC MICROSCOPIC URINALYSIS (UMFC): MUCUS RE: ABSENT

## 2014-11-06 LAB — POCT URINALYSIS DIP (MANUAL ENTRY)
Bilirubin, UA: NEGATIVE
Glucose, UA: NEGATIVE
Ketones, POC UA: NEGATIVE
Nitrite, UA: NEGATIVE
PROTEIN UA: NEGATIVE
Spec Grav, UA: <=1.005
UROBILINOGEN UA: 0.2
pH, UA: 6

## 2014-11-06 LAB — POCT GLYCOSYLATED HEMOGLOBIN (HGB A1C): Hemoglobin A1C: 6.4

## 2014-11-06 MED ORDER — PHENAZOPYRIDINE HCL 200 MG PO TABS
200.0000 mg | ORAL_TABLET | Freq: Three times a day (TID) | ORAL | Status: DC | PRN
Start: 2014-11-06 — End: 2015-02-11

## 2014-11-06 MED ORDER — FLUCONAZOLE 150 MG PO TABS: 150.0000 mg | ORAL_TABLET | Freq: Once | ORAL | Status: DC

## 2014-11-06 MED ORDER — CEPHALEXIN 500 MG PO CAPS: 1000.0000 mg | ORAL_CAPSULE | Freq: Two times a day (BID) | ORAL | Status: DC

## 2014-11-06 NOTE — Progress Notes (Signed)
11/06/2014 at 6:47 PM  [facility] / DOB: 1950-06-06 / MRN: [id number]  The patient has FX CLOSED FIBULA NOS on her problem list.  SUBJECTIVE  Betty Gonzalez is a 64 y.o. well appearing female presenting for the chief complaint of dysuria that started 4 days ago. Also complains of urgency and frequency, as well as pelvic pressure.  Denies back pain, fever, chills, nausea.   Complains that she was exposed to Strep pharyngitis roughly one month ago while sharing a bowl of salsa with a friend who was diagnosed with strep the next day.    She reports that antibiotics cause yeast infections, and this is not new.  Denies vaginal discharge and irritation today.      She  has a past medical history of Chronic kidney disease.    Medications reviewed and updated by myself where necessary, and exist elsewhere in the encounter.   Betty Gonzalez is allergic to codeine; penicillins; and sulfonamide derivatives. She  reports that she has quit smoking. She does not have any smokeless tobacco history on file. She reports that she does not drink alcohol or use illicit drugs. She  has no sexual activity history on file. The patient  has no past surgical history on file.  Her family history includes Dementia in her mother.  Review of Systems  Constitutional: Negative for fever and chills.  Gastrointestinal: Negative for nausea.  Genitourinary: Positive for dysuria, urgency and frequency. Negative for hematuria and flank pain.  Skin: Negative for itching and rash.  Neurological: Negative for dizziness and headaches.    OBJECTIVE  Her  height is 5' 8.5" (1.74 m) and weight is 167 lb (75.751 kg). Her oral temperature is 98.3 F (36.8 C). Her blood pressure is 134/76 and her pulse is 78. Her respiration is 18 and oxygen saturation is 97%.  The patient's body mass index is 25.02 kg/(m^2).  Physical Exam  Vitals reviewed. Constitutional: She is oriented to person, place, and time. She appears  well-developed.  Eyes: EOM are normal. Pupils are equal, round, and reactive to light.  Cardiovascular: Normal rate and regular rhythm.   Respiratory: Effort normal and breath sounds normal. No respiratory distress.  Neurological: She is alert and oriented to person, place, and time.  Skin: Skin is warm and dry. She is not diaphoretic.  Psychiatric: She has a normal mood and affect.    Results for orders placed or performed in visit on 11/06/14 (from the past 24 hour(s))  POCT urinalysis dipstick     Status: Abnormal   Collection Time: 11/06/14  6:22 PM  Result Value Ref Range   Color, UA light yellow (A) yellow   Clarity, UA cloudy (A) clear   Glucose, UA negative negative   Bilirubin, UA negative negative   Ketones, POC UA negative negative   Spec Grav, UA <=1.005    Blood, UA trace-intact (A) negative   pH, UA 6.0    Protein Ur, POC negative negative   Urobilinogen, UA 0.2    Nitrite, UA Negative Negative   Leukocytes, UA large (3+) (A) Negative  POCT Microscopic Urinalysis ([facility])     Status: Abnormal   Collection Time: 11/06/14  6:22 PM  Result Value Ref Range   WBC,UR,HPF,POC Many (A) None WBC/hpf   RBC,UR,HPF,POC Few (A) None RBC/hpf   Bacteria Few (A) None   Mucus Absent Absent   Epithelial Cells, UR Per Microscopy Few (A) None cells/hpf    ASSESSMENT & PLAN  Betty Gonzalez was  seen today for abdominal pain, dysuria and urinary frequency.  Diagnoses and all orders for this visit:  Dysuria: Keflex with cover for both UTI and Strep.  Will forgo testing for Strep as this would not necessarily change management.   -     POCT urinalysis dipstick -     POCT Microscopic Urinalysis ([facility])  Exposure to Streptococcal pharyngitis -     POCT rapid strep A -     Culture, Group A Strep -     cephALEXin (KEFLEX) 500 MG capsule; Take 2 capsules (1,000 mg total) by mouth 2 (two) times daily.  Urinary tract infection with hematuria, site unspecified -     Urine culture -      Hemoglobin A1c -     phenazopyridine (PYRIDIUM) 200 MG tablet; Take 1 tablet (200 mg total) by mouth 3 (three) times daily as needed for pain.  Need for prophylactic measure -     fluconazole (DIFLUCAN) 150 MG tablet; Take 1 tablet (150 mg total) by mouth once. Repeat if needed    The patient was advised to call or come back to clinic if she does not see an improvement in symptoms, or worsens with the above plan.   [name], MHS, PA-C [facility] 11/06/2014 6:47 PM

## 2014-11-08 LAB — URINE CULTURE

## 2014-11-09 ENCOUNTER — Other Ambulatory Visit: Payer: Self-pay | Admitting: Physician Assistant

## 2014-11-10 ENCOUNTER — Other Ambulatory Visit: Payer: Self-pay | Admitting: Physician Assistant

## 2014-11-10 NOTE — Telephone Encounter (Signed)
Casimiro Needle, pt was only given 3.5 day supply of keflex to take, and she advised pharm that you had instr'd her to take it for 10 days. Do you want to send RF for remaining of Rx?

## 2014-11-18 NOTE — Progress Notes (Signed)
  Medical screening examination/treatment/procedure(s) were performed by non-physician practitioner and as supervising physician I was immediately available for consultation/collaboration.     

## 2014-11-18 NOTE — Addendum Note (Signed)
Addended by: Carmelina Dane on: 11/18/2014 03:10 PM   Modules accepted: Kipp Brood

## 2015-02-11 ENCOUNTER — Ambulatory Visit (INDEPENDENT_AMBULATORY_CARE_PROVIDER_SITE_OTHER): Payer: Medicare Other | Admitting: Emergency Medicine

## 2015-02-11 VITALS — BP 130/84 | HR 84 | Temp 98.0°F | Resp 18 | Ht 68.5 in | Wt 172.0 lb

## 2015-02-11 DIAGNOSIS — R319 Hematuria, unspecified: Secondary | ICD-10-CM | POA: Diagnosis not present

## 2015-02-11 DIAGNOSIS — R350 Frequency of micturition: Secondary | ICD-10-CM | POA: Diagnosis not present

## 2015-02-11 DIAGNOSIS — N3090 Cystitis, unspecified without hematuria: Secondary | ICD-10-CM

## 2015-02-11 DIAGNOSIS — N390 Urinary tract infection, site not specified: Secondary | ICD-10-CM

## 2015-02-11 LAB — POCT URINALYSIS DIP (MANUAL ENTRY)
Bilirubin, UA: NEGATIVE
Glucose, UA: NEGATIVE
Ketones, POC UA: NEGATIVE
Nitrite, UA: NEGATIVE
PH UA: 6
PROTEIN UA: NEGATIVE
RBC UA: NEGATIVE
SPEC GRAV UA: 1.01
UROBILINOGEN UA: 0.2

## 2015-02-11 LAB — POC MICROSCOPIC URINALYSIS (UMFC): Mucus: ABSENT

## 2015-02-11 MED ORDER — NITROFURANTOIN MONOHYD MACRO 100 MG PO CAPS: 100.0000 mg | ORAL_CAPSULE | Freq: Two times a day (BID) | ORAL | Status: DC

## 2015-02-11 MED ORDER — PHENAZOPYRIDINE HCL 200 MG PO TABS: 200.0000 mg | ORAL_TABLET | Freq: Three times a day (TID) | ORAL | Status: DC | PRN

## 2015-02-11 NOTE — Patient Instructions (Signed)

## 2015-02-11 NOTE — Progress Notes (Signed)
Subjective:  Patient ID: Betty Gonzalez, female    DOB: 04-18-1950  Age: 64 y.o. MRN: [id number]  CC: Urinary Tract Infection; Dysuria; and Urinary Frequency   HPI Betty Gonzalez presents  she has a history of chronic kidney disease and recurrent urinary tract infections. She has dysuria urgency and frequency. She has no nausea vomiting or stool change. No fever or chills. No back pain no abdominal pain. She been unable to control her symptoms with over-the-counter medication  History Betty Gonzalez has a past medical history of Chronic kidney disease and Diabetes mellitus without complication (HCC).   She has no past surgical history on file.   Her  family history includes Dementia in her mother.  She   reports that she has quit smoking. She does not have any smokeless tobacco history on file. She reports that she does not drink alcohol or use illicit drugs.  Outpatient Prescriptions Prior to Visit  Medication Sig Dispense Refill  . fluticasone (FLONASE) 50 MCG/ACT nasal spray Place into both nostrils daily.    Marland Kitchen. LORazepam (ATIVAN) 1 MG tablet Take 1 mg by mouth every 8 (eight) hours.    . metFORMIN (GLUCOPHAGE) 500 MG tablet Take by mouth once.    . cephALEXin (KEFLEX) 500 MG capsule TAKE 2 CAPSULES (1,000 MG TOTAL) BY MOUTH 2 (TWO) TIMES DAILY. (Patient not taking: Reported on 02/11/2015) 26 capsule 0  . fluconazole (DIFLUCAN) 150 MG tablet Take 1 tablet (150 mg total) by mouth once. Repeat if needed (Patient not taking: Reported on 02/11/2015) 2 tablet 0  . polyethylene glycol powder (GLYCOLAX/MIRALAX) powder Take 17 g by mouth daily. (Patient not taking: Reported on 11/06/2014) 3350 g 1  . phenazopyridine (PYRIDIUM) 200 MG tablet Take 1 tablet (200 mg total) by mouth 3 (three) times daily as needed for pain. (Patient not taking: Reported on 02/11/2015) 10 tablet 0   No facility-administered medications prior to visit.    Social History   Social History  . Marital Status: Single    Spouse Name: N/A  . Number of Children: N/A  . Years of Education: N/A   Social History Main Topics  . Smoking status: Former Games developermoker  . Smokeless tobacco: None  . Alcohol Use: No  . Drug Use: No  . Sexual Activity: Not Asked   Other Topics Concern  . None   Social History Narrative     Review of Systems  Constitutional: Negative for fever, chills and appetite change.  HENT: Negative for congestion, ear pain, postnasal drip, sinus pressure and sore throat.   Eyes: Negative for pain and redness.  Respiratory: Negative for cough, shortness of breath and wheezing.   Cardiovascular: Negative for leg swelling.  Gastrointestinal: Negative for nausea, vomiting, abdominal pain, diarrhea, constipation and blood in stool.  Endocrine: Negative for polyuria.  Genitourinary: Positive for dysuria, urgency and frequency. Negative for flank pain.  Musculoskeletal: Negative for gait problem.  Skin: Negative for rash.  Neurological: Negative for weakness and headaches.  Psychiatric/Behavioral: Negative for confusion and decreased concentration. The patient is not nervous/anxious.     Objective:  BP 130/84 mmHg  Pulse 84  Temp(Src) 98 F (36.7 C) (Oral)  Resp 18  Ht 5' 8.5" (1.74 m)  Wt 172 lb (78.019 kg)  BMI 25.77 kg/m2  SpO2 96%  Physical Exam  Constitutional: She is oriented to person, place, and time. She appears well-developed and well-nourished. No distress.  HENT:  Head: Normocephalic and atraumatic.  Right Ear: External ear normal.  Left Ear: External ear normal.  Nose: Nose normal.  Eyes: Conjunctivae and EOM are normal. Pupils are equal, round, and reactive to light. No scleral icterus.  Neck: Normal range of motion. Neck supple. No tracheal deviation present.  Cardiovascular: Normal rate, regular rhythm and normal heart sounds.   Pulmonary/Chest: Effort normal. No respiratory distress. She has no wheezes. She has no rales.  Abdominal: She exhibits no mass. There is no  tenderness. There is no rebound and no guarding.  Musculoskeletal: She exhibits no edema.  Lymphadenopathy:    She has no cervical adenopathy.  Neurological: She is alert and oriented to person, place, and time. Coordination normal.  Skin: Skin is warm and dry. No rash noted.  Psychiatric: She has a normal mood and affect. Her behavior is normal.      Assessment & Plan:   Betty Gonzalez was seen today for urinary tract infection, dysuria and urinary frequency.  Diagnoses and all orders for this visit:  Cystitis -     POCT Microscopic Urinalysis ([facility]) -     POCT urinalysis dipstick  Frequent urination -     POCT Microscopic Urinalysis ([facility]) -     POCT urinalysis dipstick  Urinary tract infection with hematuria, site unspecified -     phenazopyridine (PYRIDIUM) 200 MG tablet; Take 1 tablet (200 mg total) by mouth 3 (three) times daily as needed for pain.  Other orders -     nitrofurantoin, macrocrystal-monohydrate, (MACROBID) 100 MG capsule; Take 1 capsule (100 mg total) by mouth 2 (two) times daily.   I am having Betty Gonzalez start on nitrofurantoin (macrocrystal-monohydrate). I am also having her maintain her metFORMIN, LORazepam, fluticasone, polyethylene glycol powder, fluconazole, cephALEXin, and phenazopyridine.  Meds ordered this encounter  Medications  . nitrofurantoin, macrocrystal-monohydrate, (MACROBID) 100 MG capsule    Sig: Take 1 capsule (100 mg total) by mouth 2 (two) times daily.    Dispense:  20 capsule    Refill:  0  . phenazopyridine (PYRIDIUM) 200 MG tablet    Sig: Take 1 tablet (200 mg total) by mouth 3 (three) times daily as needed for pain.    Dispense:  6 tablet    Refill:  0    Appropriate red flag conditions were discussed with the patient as well as actions that should be taken.  Patient expressed his understanding.  Follow-up: Return if symptoms worsen or fail to improve.  Carmelina Dane, MD   Results for orders placed or performed in visit  on 02/11/15  POCT Microscopic Urinalysis ([facility])  Result Value Ref Range   WBC,UR,HPF,POC None None WBC/hpf   RBC,UR,HPF,POC None None RBC/hpf   Bacteria None None, Too numerous to count   Mucus Absent Absent   Epithelial Cells, UR Per Microscopy Few (A) None, Too numerous to count cells/hpf  POCT urinalysis dipstick  Result Value Ref Range   Color, UA yellow yellow   Clarity, UA clear clear   Glucose, UA negative negative   Bilirubin, UA negative negative   Ketones, POC UA negative negative   Spec Grav, UA 1.010    Blood, UA negative negative   pH, UA 6.0    Protein Ur, POC negative negative   Urobilinogen, UA 0.2    Nitrite, UA Negative Negative   Leukocytes, UA small (1+) (A) Negative

## 2015-04-02 ENCOUNTER — Ambulatory Visit (INDEPENDENT_AMBULATORY_CARE_PROVIDER_SITE_OTHER): Payer: Medicare Other | Admitting: Physician Assistant

## 2015-04-02 VITALS — BP 128/82 | HR 83 | Temp 98.2°F | Resp 16 | Ht 68.5 in | Wt 169.0 lb

## 2015-04-02 DIAGNOSIS — Z8659 Personal history of other mental and behavioral disorders: Secondary | ICD-10-CM

## 2015-04-02 DIAGNOSIS — R7309 Other abnormal glucose: Secondary | ICD-10-CM | POA: Diagnosis not present

## 2015-04-02 DIAGNOSIS — J069 Acute upper respiratory infection, unspecified: Secondary | ICD-10-CM | POA: Diagnosis not present

## 2015-04-02 LAB — POCT GLYCOSYLATED HEMOGLOBIN (HGB A1C): HEMOGLOBIN A1C: 6.4

## 2015-04-02 MED ORDER — LORAZEPAM 1 MG PO TABS: 1.0000 mg | ORAL_TABLET | Freq: Every day | ORAL | Status: DC

## 2015-04-02 MED ORDER — METFORMIN HCL 500 MG PO TABS: 500.0000 mg | ORAL_TABLET | Freq: Once | ORAL | Status: DC

## 2015-04-02 MED ORDER — FLUTICASONE PROPIONATE 50 MCG/ACT NA SUSP: 2.0000 | Freq: Every day | NASAL | Status: DC

## 2015-04-02 NOTE — Patient Instructions (Signed)
Zyrtec D 12 hour formulation for cold.

## 2015-04-02 NOTE — Progress Notes (Signed)
04/02/2015 10:29 AM   DOB: 1950-04-19 / MRN: [id number]  SUBJECTIVE:  Betty Gonzalez is a 65 y.o. female presenting for an A1c check, medication refills, and nasal congestion. She is taking 500 mg metformin daily and is a historically well controlled diabetic.  Denies paresthesia, chest pain and any new DOE.  She takes Lorazepam to control anxiety and sometimes to sleep.  Reports she has been on up to 2 per day in the past but now only needs 1-2 tabs per week to control her symptoms which are often related to family stress.  She denies anhedonia and depression.  Complains that she has had left sided nasal congestion for 5 days now.  Associates mild cough, sore throat and post nasal drip.    She is allergic to codeine; penicillins; and sulfonamide derivatives.   She  has a past medical history of Chronic kidney disease and Diabetes mellitus without complication (HCC).    She  reports that she has quit smoking. She does not have any smokeless tobacco history on file. She reports that she does not drink alcohol or use illicit drugs. She  has no sexual activity history on file. The patient  has no past surgical history on file.  Her family history includes Dementia in her mother.  Review of Systems  Constitutional: Positive for malaise/fatigue. Negative for fever, chills and diaphoresis.  HENT: Positive for congestion and sore throat.   Respiratory: Positive for cough. Negative for hemoptysis, shortness of breath and wheezing.   Cardiovascular: Negative for chest pain.  Gastrointestinal: Negative for nausea.  Skin: Negative for rash.  Neurological: Negative for dizziness and weakness.  Endo/Heme/Allergies: Negative for polydipsia.    Problem list and medications reviewed and updated by myself where necessary, and exist elsewhere in the encounter.   OBJECTIVE:  BP 128/82 mmHg  Pulse 83  Temp(Src) 98.2 F (36.8 C) (Oral)  Resp 16  Ht 5' 8.5" (1.74 m)  Wt 169 lb (76.658 kg)  BMI 25.32  kg/m2  SpO2 96%  Physical Exam  Constitutional: She is oriented to person, place, and time. She appears well-nourished. No distress.  HENT:  Right Ear: External ear normal.  Left Ear: External ear normal.  Nose: Mucosal edema present. Right sinus exhibits no maxillary sinus tenderness and no frontal sinus tenderness. Left sinus exhibits no maxillary sinus tenderness and no frontal sinus tenderness.  Mouth/Throat: Oropharynx is clear and moist. No oropharyngeal exudate.  Eyes: Conjunctivae and EOM are normal. Pupils are equal, round, and reactive to light.  Cardiovascular: Normal rate, regular rhythm and normal heart sounds.   Pulmonary/Chest: Effort normal and breath sounds normal. She has no rales.  Abdominal: She exhibits no distension.  Neurological: She is alert and oriented to person, place, and time. No cranial nerve deficit. Gait normal.  Skin: Skin is warm and dry. No rash noted. She is not diaphoretic. No erythema.  Psychiatric: She has a normal mood and affect. Her behavior is normal.  Vitals reviewed.   Results for orders placed or performed in visit on 04/02/15 (from the past 72 hour(s))  POCT glycosylated hemoglobin (Hb A1C)     Status: None   Collection Time: 04/02/15  9:29 AM  Result Value Ref Range   Hemoglobin A1C 6.4     No results found.  ASSESSMENT AND PLAN  [name] was seen today for sore throat, left ear pain, nose congestion and medication refill.  Diagnoses and all orders for this visit:  Elevated hemoglobin A1c measurement: Maintain  current plan.  -     POCT glycosylated hemoglobin (Hb A1C) -     metFORMIN (GLUCOPHAGE) 500 MG tablet; Take 1 tablet (500 mg total) by mouth once.  Viral URI: Exam normal. See AVS.    History of anxiety: Advised that I will provide enough medication for 1 year and that I nor [facility] will be providing refills until 04/01/16.   -     LORazepam (ATIVAN) 1 MG tablet; Take 1 tablet (1 mg total) by mouth daily. Take as needed. No  refills until 04/01/16.  Other orders -     fluticasone (FLONASE) 50 MCG/ACT nasal spray; Place 2 sprays into both nostrils daily. Reported on 04/02/2015    The patient was advised to call or return to clinic if she does not see an improvement in symptoms or to seek the care of the closest emergency department if she worsens with the above plan.   [name], MHS, PA-C [facility] 04/02/2015 10:29 AM

## 2015-08-22 ENCOUNTER — Ambulatory Visit (INDEPENDENT_AMBULATORY_CARE_PROVIDER_SITE_OTHER): Payer: Medicare Other

## 2015-08-22 ENCOUNTER — Ambulatory Visit (INDEPENDENT_AMBULATORY_CARE_PROVIDER_SITE_OTHER): Payer: Medicare Other | Admitting: Family Medicine

## 2015-08-22 VITALS — BP 130/70 | HR 77 | Temp 98.3°F | Resp 16 | Ht 68.0 in | Wt 166.4 lb

## 2015-08-22 DIAGNOSIS — E119 Type 2 diabetes mellitus without complications: Secondary | ICD-10-CM | POA: Diagnosis not present

## 2015-08-22 DIAGNOSIS — M791 Myalgia, unspecified site: Secondary | ICD-10-CM

## 2015-08-22 DIAGNOSIS — S0006XA Insect bite (nonvenomous) of scalp, initial encounter: Secondary | ICD-10-CM | POA: Diagnosis not present

## 2015-08-22 DIAGNOSIS — M79672 Pain in left foot: Secondary | ICD-10-CM | POA: Diagnosis not present

## 2015-08-22 DIAGNOSIS — Z1159 Encounter for screening for other viral diseases: Secondary | ICD-10-CM

## 2015-08-22 DIAGNOSIS — W57XXXA Bitten or stung by nonvenomous insect and other nonvenomous arthropods, initial encounter: Secondary | ICD-10-CM | POA: Diagnosis not present

## 2015-08-22 DIAGNOSIS — R252 Cramp and spasm: Secondary | ICD-10-CM

## 2015-08-22 LAB — POCT GLYCOSYLATED HEMOGLOBIN (HGB A1C): Hemoglobin A1C: 6.5

## 2015-08-22 LAB — GLUCOSE, POCT (MANUAL RESULT ENTRY): POC GLUCOSE: 95 mg/dL (ref 70–99)

## 2015-08-22 MED ORDER — FLUCONAZOLE 150 MG PO TABS: 150.0000 mg | ORAL_TABLET | Freq: Once | ORAL | Status: DC

## 2015-08-22 MED ORDER — MELOXICAM 7.5 MG PO TABS: 7.5000 mg | ORAL_TABLET | Freq: Every day | ORAL | Status: DC

## 2015-08-22 MED ORDER — DOXYCYCLINE HYCLATE 100 MG PO CAPS: 100.0000 mg | ORAL_CAPSULE | Freq: Two times a day (BID) | ORAL | Status: DC

## 2015-08-22 NOTE — Progress Notes (Signed)
Subjective:    Patient ID: Betty Gonzalez, female    DOB: 29-Apr-1950, 65 y.o.   MRN: [id number]  08/22/2015  lyme disease test (pt would like to be tested for lyme disease)   HPI This 65 y.o. female presents for evaluation of several tick bites.  Has had several tick bites this Spring.  [place]  Knot on back of scalp following tick bite.  Discussing muscle pains in B calves and B feet with gynecologist last week; he recommended testing for Lymes disease or lupus. [place]; dances two nights per week.  No pain with dancing.  When sits down, has calf pain B.  At nighttime, calves distal and center of feet.  Occurs along longitudinal arch; more on LEFT than RIGHT foot.  Right leg has muscle cramps; knots up.  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  Can rub it down to calf.  Intermittent charlie horses.  Chronic condition for years.  Has [facility] the [facility]; NCS testing; labs.  No joint pain or swelling.  Gets cramps more in RIGHT.  Also has baker's cyst behind R knee.    Previously took statin and suffered with myalgias; advised to avoid statins.    PCP: Benedetto GoadFred Wilson  Urologist: recurrent UTI  Gynecologist:  Followed annually.   Review of Systems  Constitutional: Negative for fever, chills, diaphoresis and fatigue.  HENT: Negative for ear pain, postnasal drip, rhinorrhea, sinus pressure, sore throat and trouble swallowing.   Respiratory: Negative for cough and shortness of breath.   Cardiovascular: Negative for chest pain, palpitations and leg swelling.  Gastrointestinal: Negative for nausea, vomiting, abdominal pain, diarrhea and constipation.  Musculoskeletal: Positive for myalgias. Negative for arthralgias, back pain, gait problem, joint swelling, neck pain and neck stiffness.  Skin: Positive for wound. Negative for color change, pallor and rash.    Past Medical History:  Diagnosis Date  . Chronic kidney disease   . Diabetes  mellitus without complication (HCC)   . Hyperlipidemia    History reviewed. No pertinent surgical history. Allergies  Allergen Reactions  . Codeine   . Penicillins   . Sulfonamide Derivatives     Social History   Social History  . Marital status: Single    Spouse name: N/A  . Number of children: N/A  . Years of education: N/A   Occupational History  . Not on file.   Social History Main Topics  . Smoking status: Former Games developermoker  . Smokeless tobacco: Not on file  . Alcohol use No  . Drug use: No  . Sexual activity: Not on file   Other Topics Concern  . Not on file   Social History Narrative  . No narrative on file   Family History  Problem Relation Age of Onset  . Dementia Mother        Objective:    BP 130/70   Pulse 77   Temp 98.3 F (36.8 C) (Oral)   Resp 16   Ht 5\' 8"  (1.727 m)   Wt 166 lb 6.4 oz (75.5 kg)   SpO2 94%   BMI 25.30 kg/m  Physical Exam  Constitutional: She is oriented to person, place, and time. She appears well-developed and well-nourished. No distress.  HENT:  Head: Normocephalic and atraumatic.  Eyes: Conjunctivae are normal. Pupils are equal, round, and reactive to light.  Neck: Normal range of motion. Neck supple.  Cardiovascular: Normal rate, regular rhythm and normal heart sounds.  Exam reveals no gallop and no friction rub.   No murmur heard. Pulmonary/Chest: Effort normal and breath sounds normal. She has no wheezes. She has no rales.  Musculoskeletal:       Right shoulder: Normal.       Left shoulder: Normal.       Cervical back: Normal. She exhibits normal range of motion and no tenderness.       Thoracic back: Normal. She exhibits normal range of motion, no tenderness and no bony tenderness.       Lumbar back: Normal. She exhibits normal range of motion, no tenderness and no bony tenderness.       Right upper leg: Normal.       Left upper leg: Normal.       Right lower leg: Normal.       Left lower leg: Normal.        Left foot: Normal. There is normal range of motion, no tenderness, no bony tenderness, no swelling, normal capillary refill, no crepitus and no laceration.  Neurological: She is alert and oriented to person, place, and time.  Skin: Skin is warm and dry. She is not diaphoretic.  R posterior scalp with 1 cm pustular lesion without fluctuance; +TTP.  Psychiatric: She has a normal mood and affect. Her behavior is normal.  Nursing note and vitals reviewed.  Dg Foot Complete Left  Result Date: 08/22/2015 CLINICAL DATA:  Left foot pain radiating into the left calf. No reported injury. EXAM: LEFT FOOT - COMPLETE 3+ VIEW COMPARISON:  None. FINDINGS: No fracture, dislocation or suspicious focal osseous lesion. 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 Small plantar left calcaneal spur. No radiopaque foreign body. IMPRESSION: 1. No fracture or malalignment. 2. 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 Electronically Signed   By: Delbert Phenix M.D.   On: 08/22/2015 16:16       Assessment & Plan:   1. Myalgia   2. Tick bite of scalp, initial encounter   3. Cramp of both lower extremities   4. Type 2 diabetes mellitus without complication, without long-term current use of insulin (HCC)   5. Need for hepatitis C screening test   6. Left foot pain    -New. -obtain lyme titers yet discussed to limitations of these titers. -rx for Doxycycline provided due to infected tick bite on posterior scalp.  -obtain labs; recommend hydration and regular stretching or leg cramps. -recommend supportive tennis shoe for L foot pain. Recommend icing foot daily to twice daily; rx for Mobic provided.   Orders Placed This Encounter  Procedures  . DG Foot Complete Left    Standing Status:   Future    Number of Occurrences:   1    Standing Expiration Date:   08/21/2016     Order Specific Question:   Reason for Exam (SYMPTOM  OR DIAGNOSIS REQUIRED)    Answer:   L foot pain radiating into L calf distal    Order Specific Question:   Preferred imaging location?    Answer:   External  . B. burgdorfi antibodies  . CBC with Differential/Platelet  . Comprehensive metabolic panel  . Hepatitis C antibody  . CK  . ANA  . Sedimentation rate  . Rheumatoid factor  . Lyme Ab/Western Blot Reflex  . POCT glucose (manual entry)  . POCT glycosylated hemoglobin (Hb A1C)  Meds ordered this encounter  Medications  . doxycycline (VIBRAMYCIN) 100 MG capsule    Sig: Take 1 capsule (100 mg total) by mouth 2 (two) times daily.    Dispense:  30 capsule    Refill:  0  . fluconazole (DIFLUCAN) 150 MG tablet    Sig: Take 1 tablet (150 mg total) by mouth once. Repeat if needed    Dispense:  2 tablet    Refill:  0  . meloxicam (MOBIC) 7.5 MG tablet    Sig: Take 1 tablet (7.5 mg total) by mouth daily.    Dispense:  21 tablet    Refill:  0    No Follow-up on file.    Keldan Eplin Paulita Fujita, M.D. [facility] [address] [place], [place]  [address] [phone] phone [phone] fax

## 2015-08-22 NOTE — Patient Instructions (Addendum)
     IF you received an x-ray today, you will receive an invoice from [facility]. Please contact  Radiology at [phone] with questions or concerns regarding your invoice.   IF you received labwork today, you will receive an invoice from [facility]. Please contact [facility] at [phone] with questions or concerns regarding your invoice.   Our billing staff will not be able to assist you with questions regarding bills from these companies.  You will be contacted with the lab results as soon as they are available. The fastest way to get your results is to activate your My Chart account. Instructions are located on the last page of this paperwork. If you have not heard from us regarding the results in 2 weeks, please contact this office.     Leg Cramps Leg cramps occur when a muscle or muscles tighten and you have no control over this tightening (involuntary muscle contraction). Muscle cramps can develop in any muscle, but the most common place is in the calf muscles of the leg. Those cramps can occur during exercise or when you are at rest. Leg cramps are painful, and they may last for a few seconds to a few minutes. Cramps may return several times before they finally stop. Usually, leg cramps are not caused by a serious medical problem. In many cases, the cause is not known. Some common causes include:  Overexertion.  Overuse from repetitive motions, or doing the same thing over and over.  Remaining in a certain position for a long period of time.  Improper preparation, form, or technique while performing a sport or an activity.  Dehydration.  Injury.  Side effects of some medicines.  Abnormally low levels of the salts and ions in your blood (electrolytes), especially potassium and calcium. These levels could be low if you are taking water pills (diuretics) or if you are pregnant. HOME CARE INSTRUCTIONS Watch your condition for any  changes. Taking the following actions may help to lessen any discomfort that you are feeling:  Stay well-hydrated. Drink enough fluid to keep your urine clear or pale yellow.  Try massaging, stretching, and relaxing the affected muscle. Do this for several minutes at a time.  For tight or tense muscles, use a warm towel, heating pad, or hot shower water directed to the affected area.  If you are sore or have pain after a cramp, applying ice to the affected area may relieve discomfort.  Put ice in a plastic bag.  Place a towel between your skin and the bag.  Leave the ice on for 20 minutes, 2-3 times per day.  Avoid strenuous exercise for several days if you have been having frequent leg cramps.  Make sure that your diet includes the essential minerals for your muscles to work normally.  Take medicines only as directed by your health care provider. SEEK MEDICAL CARE IF:  Your leg cramps get more severe or more frequent, or they do not improve over time.  Your foot becomes cold, numb, or blue.   This information is not intended to replace advice given to you by your health care provider. Make sure you discuss any questions you have with your health care provider.   Document Released: 03/10/2004 Document Revised: 06/17/2014 Document Reviewed: 01/08/2014 Elsevier Interactive Patient Education 2016 Elsevier Inc.  

## 2015-08-23 LAB — COMPREHENSIVE METABOLIC PANEL
ALK PHOS: 76 U/L (ref 33–130)
ALT: 12 U/L (ref 6–29)
AST: 17 U/L (ref 10–35)
Albumin: 4.5 g/dL (ref 3.6–5.1)
BILIRUBIN TOTAL: 0.4 mg/dL (ref 0.2–1.2)
BUN: 16 mg/dL (ref 7–25)
CO2: 27 mmol/L (ref 20–31)
CREATININE: 0.97 mg/dL (ref 0.50–0.99)
Calcium: 9.1 mg/dL (ref 8.6–10.4)
Chloride: 105 mmol/L (ref 98–110)
GLUCOSE: 93 mg/dL (ref 65–99)
POTASSIUM: 4.6 mmol/L (ref 3.5–5.3)
SODIUM: 142 mmol/L (ref 135–146)
TOTAL PROTEIN: 7.2 g/dL (ref 6.1–8.1)

## 2015-08-23 LAB — RHEUMATOID FACTOR: Rhuematoid fact SerPl-aCnc: <10 IU/mL (ref <=14)

## 2015-08-23 LAB — CBC WITH DIFFERENTIAL/PLATELET
BASOS PCT: 1 %
Basophils Absolute: 61 cells/uL (ref 0–200)
EOS ABS: 244 {cells}/uL (ref 15–500)
Eosinophils Relative: 4 %
HCT: 39.1 % (ref 35.0–45.0)
Hemoglobin: 13.2 g/dL (ref 11.7–15.5)
LYMPHS PCT: 43 %
Lymphs Abs: 2623 cells/uL (ref 850–3900)
MCH: 29.9 pg (ref 27.0–33.0)
MCHC: 33.8 g/dL (ref 32.0–36.0)
MCV: 88.7 fL (ref 80.0–100.0)
MONOS PCT: 11 %
MPV: 11.4 fL (ref 7.5–12.5)
Monocytes Absolute: 671 cells/uL (ref 200–950)
NEUTROS ABS: 2501 {cells}/uL (ref 1500–7800)
Neutrophils Relative %: 41 %
PLATELETS: 274 10*3/uL (ref 140–400)
RBC: 4.41 MIL/uL (ref 3.80–5.10)
RDW: 13.3 % (ref 11.0–15.0)
WBC: 6.1 10*3/uL (ref 3.8–10.8)

## 2015-08-23 LAB — CK: Total CK: 168 U/L (ref 7–177)

## 2015-08-23 LAB — SEDIMENTATION RATE: Sed Rate: 13 mm/hr (ref 0–30)

## 2015-08-23 LAB — HEPATITIS C ANTIBODY: HCV AB: NEGATIVE

## 2015-08-24 LAB — LYME AB/WESTERN BLOT REFLEX

## 2015-08-24 LAB — ANA: Anti Nuclear Antibody(ANA): NEGATIVE

## 2015-10-07 ENCOUNTER — Encounter: Payer: Self-pay | Admitting: Family Medicine

## 2015-10-15 ENCOUNTER — Telehealth: Payer: Self-pay | Admitting: *Deleted

## 2015-10-15 ENCOUNTER — Other Ambulatory Visit: Payer: Self-pay | Admitting: *Deleted

## 2015-10-15 DIAGNOSIS — R7309 Other abnormal glucose: Secondary | ICD-10-CM

## 2015-10-15 MED ORDER — METFORMIN HCL 500 MG PO TABS: 500.0000 mg | ORAL_TABLET | Freq: Once | ORAL | 0 refills | Status: DC

## 2015-10-15 NOTE — Telephone Encounter (Signed)
Pt called about lab results from 08/22/15.  Pt notified of results.  She also need refill of her metformin.  Rx sent in for 3 mos.

## 2016-01-28 ENCOUNTER — Other Ambulatory Visit: Payer: Self-pay | Admitting: Family Medicine

## 2016-01-28 DIAGNOSIS — R7309 Other abnormal glucose: Secondary | ICD-10-CM

## 2016-02-01 ENCOUNTER — Ambulatory Visit (INDEPENDENT_AMBULATORY_CARE_PROVIDER_SITE_OTHER): Payer: Medicare Other | Admitting: Family Medicine

## 2016-02-01 VITALS — BP 116/74 | HR 94 | Temp 98.3°F | Resp 18 | Ht 68.0 in | Wt 168.0 lb

## 2016-02-01 DIAGNOSIS — R197 Diarrhea, unspecified: Secondary | ICD-10-CM

## 2016-02-01 DIAGNOSIS — N189 Chronic kidney disease, unspecified: Secondary | ICD-10-CM | POA: Insufficient documentation

## 2016-02-01 DIAGNOSIS — R7309 Other abnormal glucose: Secondary | ICD-10-CM

## 2016-02-01 DIAGNOSIS — R112 Nausea with vomiting, unspecified: Secondary | ICD-10-CM

## 2016-02-01 DIAGNOSIS — K529 Noninfective gastroenteritis and colitis, unspecified: Secondary | ICD-10-CM

## 2016-02-01 DIAGNOSIS — E119 Type 2 diabetes mellitus without complications: Secondary | ICD-10-CM

## 2016-02-01 LAB — POCT URINALYSIS DIP (MANUAL ENTRY)
BILIRUBIN UA: NEGATIVE
GLUCOSE UA: NEGATIVE
Ketones, POC UA: NEGATIVE
Leukocytes, UA: NEGATIVE
Nitrite, UA: NEGATIVE
Protein Ur, POC: NEGATIVE
RBC UA: NEGATIVE
Urobilinogen, UA: 0.2
pH, UA: 6

## 2016-02-01 LAB — POCT CBC
Granulocyte percent: 55.5 %G (ref 37–80)
HEMATOCRIT: 40.7 % (ref 37.7–47.9)
HEMOGLOBIN: 14 g/dL (ref 12.2–16.2)
LYMPH, POC: 1.6 (ref 0.6–3.4)
MCH, POC: 30.1 pg (ref 27–31.2)
MCHC: 34.4 g/dL (ref 31.8–35.4)
MCV: 87.4 fL (ref 80–97)
MID (cbc): 0.4 (ref 0–0.9)
MPV: 8.7 fL (ref 0–99.8)
POC GRANULOCYTE: 2.4 (ref 2–6.9)
POC LYMPH PERCENT: 36 %L (ref 10–50)
POC MID %: 8.5 %M (ref 0–12)
Platelet Count, POC: 215 10*3/uL (ref 142–424)
RBC: 4.66 M/uL (ref 4.04–5.48)
RDW, POC: 13 %
WBC: 4.4 10*3/uL — AB (ref 4.6–10.2)

## 2016-02-01 LAB — HEMOCCULT GUIAC POC 1CARD (OFFICE): Fecal Occult Blood, POC: NEGATIVE

## 2016-02-01 LAB — POCT GLYCOSYLATED HEMOGLOBIN (HGB A1C): Hemoglobin A1C: 6.9

## 2016-02-01 LAB — GLUCOSE, POCT (MANUAL RESULT ENTRY): POC GLUCOSE: 100 mg/dL — AB (ref 70–99)

## 2016-02-01 MED ORDER — METFORMIN HCL 500 MG PO TABS: 500.0000 mg | ORAL_TABLET | Freq: Every day | ORAL | 1 refills | Status: DC

## 2016-02-01 MED ORDER — BLOOD GLUCOSE MONITOR KIT: PACK | 0 refills | Status: DC

## 2016-02-01 NOTE — Progress Notes (Addendum)
Subjective:    Patient ID: Betty Gonzalez, female    DOB: 1950-04-01, 65 y.o.   MRN: [id number]  HPI Chief Complaint  Patient presents with  . Emesis  . Diarrhea  . Medication Refill    metformin    HPI Comments: Betty Gonzalez is a 65 y.o. female who [facility] the [facility] complaining of  emesis and diarrhea that began yesterday. Pt suspects symptoms are due to eating collard greens a couple days ago. She reports associated abdominal pain and dry heaving.  Pt took 2 tablets of left over phenergan. Her last episode diarrhea and emesis was this morning at 4 am. Pt has not eaten anything today except for for some toast.   Pt has hx of diverticulitis 15 months ago. She has not followed up with GI.She denies urinary symptoms, black, dark tarry stools, blood in stool.   Pt also complains of sinus pressure and pain for the past 3-4 months. She's tried OTC sinus medication.   PCP- Dr. Redmond Pulling   Patient Active Problem List   Diagnosis Date Noted  . FX CLOSED FIBULA NOS 01/03/2008   Past Medical History:  Diagnosis Date  . Chronic kidney disease   . Diabetes mellitus without complication ([place])   . Hyperlipidemia    History reviewed. No pertinent surgical history. Allergies  Allergen Reactions  . Codeine   . Penicillins   . Sulfonamide Derivatives    Prior to Admission medications   Medication Sig Start Date End Date Taking? Authorizing Provider  fluticasone (FLONASE) 50 MCG/ACT nasal spray Place 2 sprays into both nostrils daily. Reported on 04/02/2015 04/02/15  Yes Tereasa Coop, PA-C  LORazepam (ATIVAN) 1 MG tablet Take 1 tablet (1 mg total) by mouth daily. Take as needed. No refills until 04/01/16. 04/02/15  Yes Tereasa Coop, PA-C  metFORMIN (GLUCOPHAGE) 500 MG tablet take 1 tablet by mouth once daily 02/01/16  Yes Wardell Honour, MD  meloxicam (MOBIC) 7.5 MG tablet Take 1 tablet (7.5 mg total) by mouth daily. Patient not taking: Reported on 02/01/2016  08/22/15   Wardell Honour, MD   Social History   Social History  . Marital status: Single    Spouse name: N/A  . Number of children: N/A  . Years of education: N/A   Occupational History  . Not on file.   Social History Main Topics  . Smoking status: Former Research scientist (life sciences)  . Smokeless tobacco: Never Used  . Alcohol use No  . Drug use: No  . Sexual activity: Not on file   Other Topics Concern  . Not on file   Social History Narrative  . No narrative on file      Review of Systems  Constitutional: Negative for chills and fever.  HENT: Positive for sinus pain and sinus pressure.   Gastrointestinal: Positive for diarrhea, nausea and vomiting. Negative for blood in stool and constipation.  Genitourinary: Negative for difficulty urinating, dysuria, frequency and urgency.       Objective:   Physical Exam  Constitutional: She is oriented to person, place, and time. She appears well-developed and well-nourished. No distress.  HENT:  Head: Normocephalic and atraumatic.  Eyes: Conjunctivae and EOM are normal.  Neck: Neck supple. No thyromegaly present.  Question of right greater than left adenopathy  Cardiovascular: Normal rate, regular rhythm and normal heart sounds.   No murmur heard. Pulmonary/Chest: Effort normal and breath sounds normal. She has no wheezes. She has no  rales.  Abdominal: Soft. Bowel sounds are increased. There is tenderness. There is guarding ( left lower quadrant).  Bowel sound hyperactive and tympanic  Musculoskeletal: Normal range of motion.  Neurological: She is alert and oriented to person, place, and time.  Skin: Skin is warm and dry.  Psychiatric: She has a normal mood and affect. Her behavior is normal.  Nursing note and vitals reviewed.  Vitals:   02/01/16 1629  BP: 116/74  Pulse: 94  Resp: 18  Temp: 98.3 F (36.8 C)  TempSrc: Oral  SpO2: 95%  Weight: 168 lb (76.2 kg)  Height: _0  (1.727 m)    Results for orders placed or performed in  visit on 02/01/16  POCT urinalysis dipstick  Result Value Ref Range   Color, UA yellow yellow   Clarity, UA clear clear   Glucose, UA negative negative   Bilirubin, UA negative negative   Ketones, POC UA negative negative   Spec Grav, UA <=1.005    Blood, UA negative negative   pH, UA 6.0    Protein Ur, POC negative negative   Urobilinogen, UA 0.2    Nitrite, UA Negative Negative   Leukocytes, UA Negative Negative  POCT CBC  Result Value Ref Range   WBC 4.4 (A) 4.6 - 10.2 K/uL   Lymph, poc 1.6 0.6 - 3.4   POC LYMPH PERCENT 36.0 10 - 50 %L   MID (cbc) 0.4 0 - 0.9   POC MID % 8.5 0 - 12 %M   POC Granulocyte 2.4 2 - 6.9   Granulocyte percent 55.5 37 - 80 %G   RBC 4.66 4.04 - 5.48 M/uL   Hemoglobin 14.0 12.2 - 16.2 g/dL   HCT, POC 40.7 37.7 - 47.9 %   MCV 87.4 80 - 97 fL   MCH, POC 30.1 27 - 31.2 pg   MCHC 34.4 31.8 - 35.4 g/dL   RDW, POC 13.0 %   Platelet Count, POC 215 142 - 424 K/uL   MPV 8.7 0 - 99.8 fL  POCT glucose (manual entry)  Result Value Ref Range   POC Glucose 100 (A) 70 - 99 mg/dl  POCT glycosylated hemoglobin (Hb A1C)  Result Value Ref Range   Hemoglobin A1C 6.9     Assessment & Plan:   1. Nausea and vomiting, intractability of vomiting not specified, unspecified vomiting type   2. Diarrhea, unspecified type   3. Diabetes mellitus without complication ([place])   4. Gastroenteritis, acute   5. Elevated hemoglobin A1c measurement    Suspect self-limited viral GI illness, cont symptomatic care. However, pt has not followed up with her PCP and is off her DM meds. Restart and f/u with PCP asap.  Orders Placed This Encounter  Procedures  . Comprehensive metabolic panel  . Microalbumin/Creatinine Ratio, Urine  . POCT urinalysis dipstick  . POCT CBC  . POCT glucose (manual entry)  . POCT glycosylated hemoglobin (Hb A1C)  . POCT occult blood stool    Meds ordered this encounter  Medications  . DISCONTD: blood glucose meter kit and supplies KIT    Sig:  Dispense based on patient and insurance preference. Use up to check cbgs qd - either fasting qam or 2 hours after a meal, E11.9    Dispense:  1 each    Refill:  0    Order Specific Question:   Number of strips    Answer:   1000    Order Specific Question:   Number of lancets  Answer:   1000  . blood glucose meter kit and supplies KIT    Sig: Dispense based on patient and insurance preference. Use up to check cbgs qd - either fasting qam or 2 hours after a meal, E11.9    Dispense:  1 each    Refill:  0    Order Specific Question:   Number of strips    Answer:   1000    Order Specific Question:   Number of lancets    Answer:   1000  . metFORMIN (GLUCOPHAGE) 500 MG tablet    Sig: Take 1 tablet (500 mg total) by mouth daily.    Dispense:  90 tablet    Refill:  1    I personally performed the services described in this documentation, which was scribed in my presence. The recorded information has been reviewed and considered, and addended by me as needed.   Delman Cheadle, M.D.  [facility] [address] [place], [place] [address] [phone] phone [phone] fax  03/06/16 1:45 AM

## 2016-02-01 NOTE — Patient Instructions (Addendum)
Food Choices to Help Relieve Diarrhea, Adult When you have diarrhea, the foods you eat and your eating habits are very important. Choosing the right foods and drinks can help relieve diarrhea. Also, because diarrhea can last up to 7 days, you need to replace lost fluids and electrolytes (such as sodium, potassium, and chloride) in order to help prevent dehydration. What general guidelines do I need to follow?  Slowly drink 1 cup (8 oz) of fluid for each episode of diarrhea. If you are getting enough fluid, your urine will be clear or pale yellow.  Eat starchy foods. Some good choices include white rice, white toast, pasta, low-fiber cereal, baked potatoes (without the skin), saltine crackers, and bagels.  Avoid large servings of any cooked vegetables.  Limit fruit to two servings per day. A serving is  cup or 1 small piece.  Choose foods with less than 2 g of fiber per serving.  Limit fats to less than 8 tsp (38 g) per day.  Avoid fried foods.  Eat foods that have probiotics in them. Probiotics can be found in certain dairy products.  Avoid foods and beverages that may increase the speed at which food moves through the stomach and intestines (gastrointestinal tract). Things to avoid include:  High-fiber foods, such as dried fruit, raw fruits and vegetables, nuts, seeds, and whole grain foods.  Spicy foods and high-fat foods.  Foods and beverages sweetened with high-fructose corn syrup, honey, or sugar alcohols such as xylitol, sorbitol, and mannitol. What foods are recommended? Grains  White rice. White, [place], or pita breads (fresh or toasted), including plain rolls, buns, or bagels. White pasta. Saltine, soda, or graham crackers. Pretzels. Low-fiber cereal. Cooked cereals made with water (such as cornmeal, farina, or cream cereals). Plain muffins. Matzo. Melba toast. Zwieback. Vegetables  Potatoes (without the skin). Strained tomato and vegetable juices. Most well-cooked and  canned vegetables without seeds. Tender lettuce. Fruits  Cooked or canned applesauce, apricots, cherries, fruit cocktail, grapefruit, peaches, pears, or plums. Fresh bananas, apples without skin, cherries, grapes, cantaloupe, grapefruit, peaches, oranges, or plums. Meat and Other Protein Products  Baked or boiled chicken. Eggs. Tofu. Fish. Seafood. Smooth peanut butter. Ground or well-cooked tender beef, ham, veal, lamb, pork, or poultry. Dairy  Plain yogurt, kefir, and unsweetened liquid yogurt. Lactose-free milk, buttermilk, or soy milk. Plain hard cheese. Beverages  Sport drinks. Clear broths. Diluted fruit juices (except prune). Regular, caffeine-free sodas such as ginger ale. Water. Decaffeinated teas. Oral rehydration solutions. Sugar-free beverages not sweetened with sugar alcohols. Other  Bouillon, broth, or soups made from recommended foods. The items listed above may not be a complete list of recommended foods or beverages. Contact your dietitian for more options.  What foods are not recommended? Grains  Whole grain, whole wheat, bran, or rye breads, rolls, pastas, crackers, and cereals. Wild or brown rice. Cereals that contain more than 2 g of fiber per serving. Corn tortillas or taco shells. Cooked or dry oatmeal. Granola. Popcorn. Vegetables  Raw vegetables. Cabbage, broccoli, Brussels sprouts, artichokes, baked beans, beet greens, corn, kale, legumes, peas, sweet potatoes, and yams. Potato skins. Cooked spinach and cabbage. Fruits  Dried fruit, including raisins and dates. Raw fruits. Stewed or dried prunes. Fresh apples with skin, apricots, mangoes, pears, raspberries, and strawberries. Meat and Other Protein Products  Chunky peanut butter. Nuts and seeds. Beans and lentils. Tomasa Blase. Dairy  High-fat cheeses. Milk, chocolate milk, and beverages made with milk, such as milk shakes. Cream. Ice cream. Sweets and  Desserts  Sweet rolls, doughnuts, and sweet breads. Pancakes and  waffles. Fats and Oils  Butter. Cream sauces. Margarine. Salad oils. Plain salad dressings. Olives. Avocados. Beverages  Caffeinated beverages (such as coffee, tea, soda, or energy drinks). Alcoholic beverages. Fruit juices with pulp. Prune juice. Soft drinks sweetened with high-fructose corn syrup or sugar alcohols. Other  Coconut. Hot sauce. Chili powder. Mayonnaise. Gravy. Cream-based or milk-based soups. The items listed above may not be a complete list of foods and beverages to avoid. Contact your dietitian for more information.  What should I do if I become dehydrated? Diarrhea can sometimes lead to dehydration. Signs of dehydration include dark urine and dry mouth and skin. If you think you are dehydrated, you should rehydrate with an oral rehydration solution. These solutions can be purchased at pharmacies, retail stores, or online. Drink -1 cup (120-240 mL) of oral rehydration solution each time you have an episode of diarrhea. If drinking this amount makes your diarrhea worse, try drinking smaller amounts more often. For example, drink 1-3 tsp (5-15 mL) every 5-10 minutes. A general rule for staying hydrated is to drink 1-2 L of fluid per day. Talk to your health care provider about the specific amount you should be drinking each day. Drink enough fluids to keep your urine clear or pale yellow. This information is not intended to replace advice given to you by your health care provider. Make sure you discuss any questions you have with your health care provider. Document Released: 04/23/2003 Document Revised: 07/09/2015 Document Reviewed: 12/24/2012 Elsevier Interactive Patient Education  2017 [employer]   IF you received an x-ray today, you will receive an invoice from [facility]. Please contact [facility] at [phone] with questions or concerns regarding your invoice.   IF you received labwork today, you will receive an invoice from [facility]. Please contact  [facility] at [phone] with questions or concerns regarding your invoice.   Our billing staff will not be able to assist you with questions regarding bills from these companies.  You will be contacted with the lab results as soon as they are available. The fastest way to get your results is to activate your My Chart account. Instructions are located on the last page of this paperwork. If you have not heard from us regarding the results in 2 weeks, please contact this office.     Viral Gastroenteritis, Adult Viral gastroenteritis is also known as the stomach flu. This condition is caused by various viruses. These viruses can be passed from person to person very easily (are very contagious). This condition may affect your stomach, small intestine, and large intestine. It can cause sudden watery diarrhea, fever, and vomiting. Diarrhea and vomiting can make you feel weak and cause you to become dehydrated. You may not be able to keep fluids down. Dehydration can make you tired and thirsty, cause you to have a dry mouth, and decrease how often you urinate. Older adults and people with other diseases or a weak immune system are at higher risk for dehydration. It is important to replace the fluids that you lose from diarrhea and vomiting. If you become severely dehydrated, you may need to get fluids through an IV tube. What are the causes? Gastroenteritis is caused by various viruses, including rotavirus and norovirus. Norovirus is the most common cause in adults. You can get sick by eating food, drinking water, or touching a surface contaminated with one of these viruses. You can also get sick from sharing utensils or  other personal items with an infected person. What increases the risk? This condition is more likely to develop in people:  Who have a weak defense system (immune system).  Who live with one or more children who are younger than 67 years old.  Who live in a nursing home.  Who go on  cruise ships. What are the signs or symptoms? Symptoms of this condition start suddenly 1-2 days after exposure to a virus. Symptoms may last a few days or as long as a week. The most common symptoms are watery diarrhea and vomiting. Other symptoms include:  Fever.  Headache.  Fatigue.  Pain in the abdomen.  Chills.  Weakness.  Nausea.  Muscle aches.  Loss of appetite. How is this diagnosed? This condition is diagnosed with a medical history and physical exam. You may also have a stool test to check for viruses or other infections. How is this treated? This condition typically goes away on its own. The focus of treatment is to restore lost fluids (rehydration). Your health care provider may recommend that you take an oral rehydration solution (ORS) to replace important salts and minerals (electrolytes) in your body. Severe cases of this condition may require giving fluids through an IV tube. Treatment may also include medicine to help with your symptoms. Follow these instructions at home: Follow instructions from your health care provider about how to care for yourself at home. Eating and drinking Follow these recommendations as told by your health care provider:  Take an ORS. This is a drink that is sold at pharmacies and retail stores.  Drink clear fluids in small amounts as you are able. Clear fluids include water, ice chips, diluted fruit juice, and low-calorie sports drinks.  Eat bland, easy-to-digest foods in small amounts as you are able. These foods include bananas, applesauce, rice, lean meats, toast, and crackers.  Avoid fluids that contain a lot of sugar or caffeine, such as energy drinks, sports drinks, and soda.  Avoid alcohol.  Avoid spicy or fatty foods. General instructions  Drink enough fluid to keep your urine clear or pale yellow.  Wash your hands often. If soap and water are not available, use hand sanitizer.  Make sure that all people in your  household wash their hands well and often.  Take over-the-counter and prescription medicines only as told by your health care provider.  Rest at home while you recover.  Watch your condition for any changes.  Take a warm bath to relieve any burning or pain from frequent diarrhea episodes.  Keep all follow-up visits as told by your health care provider. This is important. Contact a health care provider if:  You cannot keep fluids down.  Your symptoms get worse.  You have new symptoms.  You feel light-headed or dizzy.  You have muscle cramps. Get help right away if:  You have chest pain.  You feel extremely weak or you faint.  You see blood in your vomit.  Your vomit looks like coffee grounds.  You have bloody or black stools or stools that look like tar.  You have a severe headache, a stiff neck, or both.  You have a rash.  You have severe pain, cramping, or bloating in your abdomen.  You have trouble breathing or you are breathing very quickly.  Your heart is beating very quickly.  Your skin feels cold and clammy.  You feel confused.  You have pain when you urinate.  You have signs of dehydration, such as:  Dark urine, very little urine, or no urine.  Cracked lips.  Dry mouth.  Sunken eyes.  Sleepiness.  Weakness. This information is not intended to replace advice given to you by your health care provider. Make sure you discuss any questions you have with your health care provider. Document Released: 01/31/2005 Document Revised: 07/15/2015 Document Reviewed: 10/07/2014 Elsevier Interactive Patient Education  2017 [employer].

## 2016-02-02 LAB — COMPREHENSIVE METABOLIC PANEL
ALT: 21 IU/L (ref 0–32)
AST: 27 IU/L (ref 0–40)
Albumin/Globulin Ratio: 1.6 (ref 1.2–2.2)
Albumin: 4.6 g/dL (ref 3.6–4.8)
Alkaline Phosphatase: 90 IU/L (ref 39–117)
BUN/Creatinine Ratio: 17 (ref 12–28)
BUN: 15 mg/dL (ref 8–27)
Bilirubin Total: 0.3 mg/dL (ref 0.0–1.2)
CALCIUM: 8.9 mg/dL (ref 8.7–10.3)
CHLORIDE: 100 mmol/L (ref 96–106)
CO2: 25 mmol/L (ref 18–29)
Creatinine, Ser: 0.89 mg/dL (ref 0.57–1.00)
GFR, EST AFRICAN AMERICAN: 78 mL/min/{1.73_m2} (ref >59)
GFR, EST NON AFRICAN AMERICAN: 67 mL/min/{1.73_m2} (ref >59)
GLUCOSE: 111 mg/dL — AB (ref 65–99)
Globulin, Total: 2.9 g/dL (ref 1.5–4.5)
Potassium: 3.7 mmol/L (ref 3.5–5.2)
Sodium: 142 mmol/L (ref 134–144)
TOTAL PROTEIN: 7.5 g/dL (ref 6.0–8.5)

## 2016-02-02 LAB — MICROALBUMIN / CREATININE URINE RATIO
Creatinine, Urine: 42 mg/dL
MICROALB/CREAT RATIO: 13.3 mg/g{creat} (ref 0.0–30.0)
MICROALBUM., U, RANDOM: 5.6 ug/mL

## 2016-03-20 ENCOUNTER — Other Ambulatory Visit: Payer: Self-pay | Admitting: Family Medicine

## 2016-03-20 DIAGNOSIS — R7309 Other abnormal glucose: Secondary | ICD-10-CM

## 2016-04-14 ENCOUNTER — Ambulatory Visit: Payer: Medicare Other

## 2016-04-14 ENCOUNTER — Encounter: Payer: Self-pay | Admitting: Podiatry

## 2016-04-14 ENCOUNTER — Ambulatory Visit (INDEPENDENT_AMBULATORY_CARE_PROVIDER_SITE_OTHER): Payer: Medicare Other | Admitting: Podiatry

## 2016-04-14 VITALS — BP 116/70 | HR 68 | Resp 16 | Ht 68.0 in | Wt 162.0 lb

## 2016-04-14 DIAGNOSIS — E1140 Type 2 diabetes mellitus with diabetic neuropathy, unspecified: Secondary | ICD-10-CM | POA: Diagnosis not present

## 2016-04-14 DIAGNOSIS — M779 Enthesopathy, unspecified: Secondary | ICD-10-CM | POA: Diagnosis not present

## 2016-04-14 DIAGNOSIS — M79672 Pain in left foot: Secondary | ICD-10-CM

## 2016-04-14 DIAGNOSIS — E1149 Type 2 diabetes mellitus with other diabetic neurological complication: Secondary | ICD-10-CM

## 2016-04-14 DIAGNOSIS — Q828 Other specified congenital malformations of skin: Secondary | ICD-10-CM | POA: Diagnosis not present

## 2016-04-14 DIAGNOSIS — B079 Viral wart, unspecified: Secondary | ICD-10-CM | POA: Diagnosis not present

## 2016-04-14 MED ORDER — TRIAMCINOLONE ACETONIDE 10 MG/ML IJ SUSP
10.0000 mg | Freq: Once | INTRAMUSCULAR | Status: AC
  Administered 2016-04-14: 10 mg

## 2016-04-14 NOTE — Progress Notes (Signed)
Subjective:     Patient ID: Betty Gonzalez, female   DOB: 05-27-1950, 66 y.o.   MRN: [id number]  HPI patient presents with inflammation and fluid around the first metatarsal head left it's painful when pressed with keratotic lesion formation and possible wart type lesion that she states is hard to walk on   Review of Systems  All other systems reviewed and are negative.      Objective:   Physical Exam  Constitutional: She is oriented to person, place, and time.  Cardiovascular: Intact distal pulses.   Musculoskeletal: Normal range of motion.  Neurological: She is oriented to person, place, and time.  Skin: Skin is warm.  Nursing note and vitals reviewed.  neurovascular status intact muscle strength was adequate range of motion within normal limits with patient found to have inflammation around the first MPJ left that's painful when pressed and also upon debridement keratotic lesion showing pinpoint bleeding. There is several other lesions with lucent-type core worse present     Assessment:     Inflammatory capsulitis first MPJ left plantar along with lesion with pinpoint bleeding and also several other lesions    Plan:     H&P conditions reviewed and I injected the plantar capsule left 3 mg Kenalog 5 mg Xylocaine. I then debrided all lesions and applied chemical agent to the left first metatarsal to reduce what's possibly wart tissue and patient be reevaluated in 1 month and may require secondary treatment

## 2016-04-14 NOTE — Progress Notes (Signed)
   Subjective:    Patient ID: Betty Gonzalez, female    DOB: 03-30-1950, 66 y.o.   MRN: [id number]  HPI Chief Complaint  Patient presents with  . Painful Lesion    Bilateral; plantar forefoot- below great toe; pt stated, "Left foot hurts more"; x1 month; Pt Diabetic Type 2; Sugar=Did not check today; A1C=6.7      Review of Systems  Constitutional: Positive for activity change.  HENT: Positive for sinus pain.   Musculoskeletal: Positive for gait problem.  All other systems reviewed and are negative.      Objective:   Physical Exam        Assessment & Plan:

## 2016-04-15 ENCOUNTER — Telehealth: Payer: Self-pay | Admitting: *Deleted

## 2016-04-15 NOTE — Telephone Encounter (Signed)
Pt states Dr. Charlsie Merlesegal trimmed her foot yesterday and put a medication on it and it is tender now. I told pt that she could was the area, pat dry and put neosporin on it, and offload with a non-medicated pad with a hole to surround the tender area.

## 2016-04-27 ENCOUNTER — Other Ambulatory Visit: Payer: Self-pay | Admitting: Family Medicine

## 2016-04-27 DIAGNOSIS — R7309 Other abnormal glucose: Secondary | ICD-10-CM

## 2016-05-12 ENCOUNTER — Ambulatory Visit (INDEPENDENT_AMBULATORY_CARE_PROVIDER_SITE_OTHER): Payer: Medicare Other | Admitting: Podiatry

## 2016-05-12 ENCOUNTER — Encounter: Payer: Self-pay | Admitting: Podiatry

## 2016-05-12 DIAGNOSIS — B079 Viral wart, unspecified: Secondary | ICD-10-CM | POA: Diagnosis not present

## 2016-05-12 NOTE — Patient Instructions (Signed)

## 2016-05-12 NOTE — Progress Notes (Signed)
Subjective:     Patient ID: Betty Gonzalez, female   DOB: [birth year], 66 y.o.   MRN: [id number]  HPI patient presents stating the lesion is still present left but it has improved from previous   Review of Systems     Objective:   Physical Exam Neurovascular status intact with keratotic lesion sub-first metatarsal left upon debridement bleeding is noted and it is painful to lateral pressure    Assessment:     Verruca plantaris left with pain    Plan:     Debride lesion fully and applied chemical agent to create an immune response was sterile dressing. Reappoint to recheck

## 2016-06-13 ENCOUNTER — Other Ambulatory Visit: Payer: Self-pay | Admitting: Family Medicine

## 2016-06-13 DIAGNOSIS — R7309 Other abnormal glucose: Secondary | ICD-10-CM

## 2016-06-17 ENCOUNTER — Ambulatory Visit (INDEPENDENT_AMBULATORY_CARE_PROVIDER_SITE_OTHER): Payer: Medicare Other | Admitting: Physician Assistant

## 2016-06-17 ENCOUNTER — Encounter: Payer: Self-pay | Admitting: Physician Assistant

## 2016-06-17 VITALS — BP 130/80 | HR 80 | Temp 98.1°F | Resp 16 | Ht 68.0 in | Wt 170.8 lb

## 2016-06-17 DIAGNOSIS — Z1211 Encounter for screening for malignant neoplasm of colon: Secondary | ICD-10-CM

## 2016-06-17 DIAGNOSIS — F411 Generalized anxiety disorder: Secondary | ICD-10-CM

## 2016-06-17 DIAGNOSIS — F410 Panic disorder [episodic paroxysmal anxiety] without agoraphobia: Secondary | ICD-10-CM

## 2016-06-17 DIAGNOSIS — E119 Type 2 diabetes mellitus without complications: Secondary | ICD-10-CM

## 2016-06-17 DIAGNOSIS — R7309 Other abnormal glucose: Secondary | ICD-10-CM

## 2016-06-17 LAB — POCT URINALYSIS DIP (MANUAL ENTRY)
BILIRUBIN UA: NEGATIVE mg/dL
Bilirubin, UA: NEGATIVE
Blood, UA: NEGATIVE
Glucose, UA: NEGATIVE mg/dL
Leukocytes, UA: NEGATIVE
Nitrite, UA: NEGATIVE
PH UA: 5.5 (ref 5.0–8.0)
Protein Ur, POC: NEGATIVE mg/dL
SPEC GRAV UA: 1.01 (ref 1.010–1.025)
Urobilinogen, UA: 0.2 E.U./dL

## 2016-06-17 LAB — POCT CBC
Granulocyte percent: 51.3 %G (ref 37–80)
HCT, POC: 37.7 % (ref 37.7–47.9)
Hemoglobin: 13.4 g/dL (ref 12.2–16.2)
Lymph, poc: 2 (ref 0.6–3.4)
MCH: 30.6 pg (ref 27–31.2)
MCHC: 35.5 g/dL — AB (ref 31.8–35.4)
MCV: 86.3 fL (ref 80–97)
MID (CBC): 0.6 (ref 0–0.9)
MPV: 9 fL (ref 0–99.8)
PLATELET COUNT, POC: 242 10*3/uL (ref 142–424)
POC Granulocyte: 2.7 (ref 2–6.9)
POC LYMPH %: 37.8 % (ref 10–50)
POC MID %: 10.9 %M (ref 0–12)
RBC: 4.36 M/uL (ref 4.04–5.48)
RDW, POC: 13 %
WBC: 5.3 10*3/uL (ref 4.6–10.2)

## 2016-06-17 LAB — POCT GLYCOSYLATED HEMOGLOBIN (HGB A1C): HEMOGLOBIN A1C: 7.2

## 2016-06-17 MED ORDER — LORAZEPAM 1 MG PO TABS: 1.0000 mg | ORAL_TABLET | Freq: Every day | ORAL | 0 refills | Status: DC | PRN

## 2016-06-17 MED ORDER — FLUCONAZOLE 150 MG PO TABS: 150.0000 mg | ORAL_TABLET | Freq: Once | ORAL | 3 refills | Status: AC

## 2016-06-17 MED ORDER — METFORMIN HCL 500 MG PO TABS: 500.0000 mg | ORAL_TABLET | Freq: Every day | ORAL | 3 refills | Status: DC

## 2016-06-17 NOTE — Patient Instructions (Signed)
     IF you received an x-ray today, you will receive an invoice from [facility]. Please contact [facility] at [phone] with questions or concerns regarding your invoice.   IF you received labwork today, you will receive an invoice from [facility]. Please contact [facility] at [phone] with questions or concerns regarding your invoice.   Our billing staff will not be able to assist you with questions regarding bills from these companies.  You will be contacted with the lab results as soon as they are available. The fastest way to get your results is to activate your My Chart account. Instructions are located on the last page of this paperwork. If you have not heard from us regarding the results in 2 weeks, please contact this office.     

## 2016-06-17 NOTE — Progress Notes (Signed)
06/17/2016 8:52 AM   DOB: 26-Aug-1950 / MRN: [id number]  SUBJECTIVE:  Betty Gonzalez is a 66 y.o. female presenting for medication refills.  Has a history of well controlled diabetes and take 500 mg of metformin daily.   Denies sock and glove paresthesia.    Takes 1-2 lorazepam weekly for panic symptoms only.  Was given 90 tabs over a year ago and has not had any refills per [facility]. Tells me her symptoms are doing relatively well aside from continued family stress.    She has a history of seasonal allergies and is managed with Flonase.  Feels well today.   She complains of a history of vaginal itching and discharge and this is associate mostly with antibiotic usage.  She also has a history of chronic UTI and has a abx prescribed symptomatically.  Reports she will often get a yeast infection after ABX use.  She would like a prescription for yeast.  No history of liver disease.       She is allergic to codeine; other; penicillins; sulfonamide derivatives; and tape.   She  has a past medical history of Chronic kidney disease; Diabetes mellitus without complication (HCC); and Hyperlipidemia.    She  reports that she has quit smoking. She has never used smokeless tobacco. She reports that she does not drink alcohol or use drugs. She  has no sexual activity history on file. The patient  has no past surgical history on file.  Her family history includes Dementia in her mother.  Review of Systems  Constitutional: Negative for chills, diaphoresis and fever.  Eyes: Negative.   Respiratory: Negative for cough, hemoptysis, sputum production, shortness of breath and wheezing.   Cardiovascular: Negative for chest pain, orthopnea and leg swelling.  Gastrointestinal: Negative for nausea.  Skin: Negative for rash.  Neurological: Negative for dizziness, sensory change, speech change, focal weakness and headaches.    The problem list and medications were reviewed and updated by myself where necessary and  exist elsewhere in the encounter.   OBJECTIVE:  BP 130/80 (BP Location: Right Arm, Patient Position: Sitting, Cuff Size: Normal)   Pulse 80   Temp 98.1 F (36.7 C) (Oral)   Resp 16   Ht 5\' 8"  (1.727 m)   Wt 170 lb 12.8 oz (77.5 kg)   SpO2 98%   BMI 25.97 kg/m   Physical Exam  Constitutional: She is active.  Non-toxic appearance.  Cardiovascular: Normal rate, regular rhythm, S1 normal, S2 normal, normal heart sounds and intact distal pulses.  Exam reveals no gallop, no friction rub and no decreased pulses.   No murmur heard. Pulmonary/Chest: Effort normal. No stridor. No tachypnea. No respiratory distress. She has no wheezes. She has no rales.  Abdominal: She exhibits no distension.  Musculoskeletal: She exhibits no edema.  Neurological: She is alert.  Skin: Skin is warm and dry. She is not diaphoretic. No pallor.    Results for orders placed or performed in visit on 06/17/16 (from the past 72 hour(s))  POCT CBC     Status: Abnormal   Collection Time: 06/17/16  8:47 AM  Result Value Ref Range   WBC 5.3 4.6 - 10.2 K/uL   Lymph, poc 2.0 0.6 - 3.4   POC LYMPH PERCENT 37.8 10 - 50 %L   MID (cbc) 0.6 0 - 0.9   POC MID % 10.9 0 - 12 %M   POC Granulocyte 2.7 2 - 6.9   Granulocyte percent 51.3 37 - 80 %G  RBC 4.36 4.04 - 5.48 M/uL   Hemoglobin 13.4 12.2 - 16.2 g/dL   HCT, POC 16.137.7 09.637.7 - 47.9 %   MCV 86.3 80 - 97 fL   MCH, POC 30.6 27 - 31.2 pg   MCHC 35.5 (A) 31.8 - 35.4 g/dL   RDW, POC 04.513.0 %   Platelet Count, POC 242 142 - 424 K/uL   MPV 9.0 0 - 99.8 fL  POCT glycosylated hemoglobin (Hb A1C)     Status: None   Collection Time: 06/17/16  8:51 AM  Result Value Ref Range   Hemoglobin A1C 7.2    Lab Results  Component Value Date   ALT 21 02/01/2016   AST 27 02/01/2016   ALKPHOS 90 02/01/2016   BILITOT 0.3 02/01/2016    No results found.   ASSESSMENT AND PLAN:  Betty Gonzalez was seen today for medication refill.  Diagnoses and all orders for this visit:  Well  controlled diabetes mellitus (HCC): Non smoker.  BP within goal.  Advised she go back to her eye doctor.  She tells me she has had her pneumovaccinations however has no documentation.  She will see her pharmacy at [facility] to try and find the validation so I can check this off her health maintenance.  -     POCT glycosylated hemoglobin (Hb A1C) -     POCT urinalysis dipstick -     POCT CBC -     Lipid panel -     TSH  Generalized anxiety disorder with panic attacks: Well controlled.  Will represcribe.   -     LORazepam (ATIVAN) 1 MG tablet; Take 1 tablet (1 mg total) by mouth daily as needed for anxiety. No refills until 06/17/17.  Elevated hemoglobin A1c measurement: I advised that she go up to 1000 mg however she wants to come back in 3 months for a recheck a1c.   -     metFORMIN (GLUCOPHAGE) 500 MG tablet; Take 1 tablet (500 mg total) by mouth daily.    The patient is advised to call or return to clinic if she does not see an improvement in symptoms, or to seek the care of the closest emergency department if she worsens with the above plan.   Deliah BostonMichael Ithan Touhey, MHS, PA-C [facility] 06/17/2016 8:52 AM

## 2016-06-18 LAB — LIPID PANEL
CHOL/HDL RATIO: 6.2 ratio — AB (ref 0.0–4.4)
Cholesterol, Total: 268 mg/dL — ABNORMAL HIGH (ref 100–199)
HDL: 43 mg/dL (ref >39)
LDL Calculated: 166 mg/dL — ABNORMAL HIGH (ref 0–99)
TRIGLYCERIDES: 294 mg/dL — AB (ref 0–149)
VLDL Cholesterol Cal: 59 mg/dL — ABNORMAL HIGH (ref 5–40)

## 2016-06-18 LAB — TSH: TSH: 1.79 u[IU]/mL (ref 0.450–4.500)

## 2016-06-18 LAB — MICROALBUMIN, URINE: MICROALBUM., U, RANDOM: 3.5 ug/mL

## 2016-06-18 NOTE — Progress Notes (Signed)
Per our discussion in the office she will not start lipid medication. Betty BostonMichael Panda Crossin, MS, PA-C 8:09 AM, 06/18/2016

## 2016-09-19 ENCOUNTER — Encounter: Payer: Self-pay | Admitting: Physician Assistant

## 2016-09-19 ENCOUNTER — Ambulatory Visit (INDEPENDENT_AMBULATORY_CARE_PROVIDER_SITE_OTHER): Payer: Medicare Other | Admitting: Physician Assistant

## 2016-09-19 VITALS — BP 124/74 | HR 69 | Temp 98.3°F | Resp 18 | Ht 68.0 in | Wt 167.4 lb

## 2016-09-19 DIAGNOSIS — E119 Type 2 diabetes mellitus without complications: Secondary | ICD-10-CM

## 2016-09-19 DIAGNOSIS — R202 Paresthesia of skin: Secondary | ICD-10-CM | POA: Diagnosis not present

## 2016-09-19 DIAGNOSIS — E785 Hyperlipidemia, unspecified: Secondary | ICD-10-CM

## 2016-09-19 DIAGNOSIS — M79609 Pain in unspecified limb: Secondary | ICD-10-CM

## 2016-09-19 DIAGNOSIS — R300 Dysuria: Secondary | ICD-10-CM | POA: Diagnosis not present

## 2016-09-19 DIAGNOSIS — F411 Generalized anxiety disorder: Secondary | ICD-10-CM | POA: Diagnosis not present

## 2016-09-19 DIAGNOSIS — F410 Panic disorder [episodic paroxysmal anxiety] without agoraphobia: Secondary | ICD-10-CM | POA: Diagnosis not present

## 2016-09-19 MED ORDER — LORAZEPAM 1 MG PO TABS: 1.0000 mg | ORAL_TABLET | Freq: Every day | ORAL | 0 refills | Status: DC | PRN

## 2016-09-19 MED ORDER — METFORMIN HCL ER 500 MG PO TB24: 500.0000 mg | ORAL_TABLET | Freq: Every day | ORAL | 3 refills | Status: DC

## 2016-09-19 MED ORDER — CEPHALEXIN 500 MG PO CAPS: 500.0000 mg | ORAL_CAPSULE | Freq: Two times a day (BID) | ORAL | 0 refills | Status: DC

## 2016-09-19 NOTE — Patient Instructions (Signed)
     IF you received an x-ray today, you will receive an invoice from [facility]. Please contact [facility] at [phone] with questions or concerns regarding your invoice.   IF you received labwork today, you will receive an invoice from [facility]. Please contact [facility] at [phone] with questions or concerns regarding your invoice.   Our billing staff will not be able to assist you with questions regarding bills from these companies.  You will be contacted with the lab results as soon as they are available. The fastest way to get your results is to activate your My Chart account. Instructions are located on the last page of this paperwork. If you have not heard from us regarding the results in 2 weeks, please contact this office.     

## 2016-09-19 NOTE — Progress Notes (Signed)
09/19/2016 9:29 AM   DOB: [birth year] / MRN: [id number]  SUBJECTIVE:  Betty Gonzalez is a 66 y.o. female presenting for refill of medications.  Has an A1c with her today and tells me this was 6.8.  She has been able to get this down from 7.2 with medication compliance and a few diet changes.  She did have some burning with urination yesterday and has had frequent UTIs and sees urology for this. Denies dysuria, urinary frequency and urgency.   Has been having shooting pains when she raises her arms about the bilateral armpits.  This started this spring. States this is shock like sensation. Does not cause any disability. Feel that this is not getting better or worse. She gets a mammogram yearly via GYN and tells me she has never had any problems.   She is allergic to codeine; other; sulfonamide derivatives; and tape.   She  has a past medical history of Chronic kidney disease; Diabetes mellitus without complication (HCC); and Hyperlipidemia.    She  reports that she has quit smoking. She has never used smokeless tobacco. She reports that she does not drink alcohol or use drugs. She  has no sexual activity history on file. The patient  has no past surgical history on file.  Her family history includes Dementia in her mother.  Review of Systems  Constitutional: Negative for chills and fever.  Genitourinary: Positive for dysuria. Negative for flank pain, frequency, hematuria and urgency.  Musculoskeletal: Negative for myalgias.  Skin: Negative for rash.  Neurological: Negative for dizziness.  Psychiatric/Behavioral: Negative for depression.    The problem list and medications were reviewed and updated by myself where necessary and exist elsewhere in the encounter.   OBJECTIVE:  BP 124/74 (BP Location: Right Arm, Patient Position: Sitting, Cuff Size: Normal)   Pulse 69   Temp 98.3 F (36.8 C) (Oral)   Resp 18   Ht 5\' 8"  (1.727 m)   Wt 167 lb 6.4 oz (75.9 kg)   SpO2 96%   BMI 25.45  kg/m   Wt Readings from Last 3 Encounters:  09/19/16 167 lb 6.4 oz (75.9 kg)  06/17/16 170 lb 12.8 oz (77.5 kg)  04/14/16 162 lb (73.5 kg)     Physical Exam  Constitutional: She is active.  Non-toxic appearance.  Cardiovascular: Normal rate, regular rhythm, S1 normal, S2 normal, normal heart sounds and intact distal pulses.  Exam reveals no gallop, no friction rub and no decreased pulses.   No murmur heard. Pulmonary/Chest: Effort normal. No stridor. No tachypnea. No respiratory distress. She has no wheezes. She has no rales.    Abdominal: She exhibits no distension.  Musculoskeletal: She exhibits no edema.  Neurological: She is alert.  Skin: Skin is warm and dry. She is not diaphoretic. No pallor.    Lab Results  Component Value Date   HGBA1C 7.2 06/17/2016   Lab Results  Component Value Date   CHOL 268 (H) 06/17/2016   HDL 43 06/17/2016   LDLCALC 166 (H) 06/17/2016   TRIG 294 (H) 06/17/2016   CHOLHDL 6.2 (H) 06/17/2016      No results found for this or any previous visit (from the past 72 hour(s)).  No results found.  ASSESSMENT AND PLAN:  Betty Gonzalez was seen today for medication refill, diabetes and follow-up.  Diagnoses and all orders for this visit:  Dyslipidemia: She has a history of statin intolerance. She has been working on her diet and wants a recheck today.  -  Lipid panel  Generalized anxiety disorder with panic attacks: -     LORazepam (ATIVAN) 1 MG tablet; Take 1 tablet (1 mg total) by mouth daily as needed for anxiety. No refills until 09/29/2017  Type 2 diabetes mellitus without complication, without long-term current use of insulin (HCC): Improved per health fair check at 6.8.  Hypertension well controlled.  We need to collect a microalbumin however she can not urinate today.  -     metFORMIN (GLUCOPHAGE XR) 500 MG 24 hr tablet; Take 1 tablet (500 mg total) by mouth daily with breakfast. -     Ambulatory referral to diabetic education  Dysuria:  She has a history of frequent UTI. She can not urinate today.  Advised that she may need to call her urologist.  Advised that she hold the abx and start only if she gets worse.  I would have liked to get a urine but again she could not urinate today.  -     cephALEXin (KEFLEX) 500 MG capsule; Take 1 capsule (500 mg total) by mouth 2 (two) times daily.  Paresthesia and pain of extremity: She wants to see GYN regarding this.  I appreciate no abnormalities on exam.     The patient is advised to call or return to clinic if she does not see an improvement in symptoms, or to seek the care of the closest emergency department if she worsens with the above plan.   [name], MHS, PA-C Primary Care at [facility] 09/19/2016 9:29 AM

## 2016-09-20 LAB — LIPID PANEL
CHOLESTEROL TOTAL: 234 mg/dL — AB (ref 100–199)
Chol/HDL Ratio: 5.2 ratio — ABNORMAL HIGH (ref 0.0–4.4)
HDL: 45 mg/dL (ref >39)
LDL Calculated: 130 mg/dL — ABNORMAL HIGH (ref 0–99)
TRIGLYCERIDES: 293 mg/dL — AB (ref 0–149)
VLDL Cholesterol Cal: 59 mg/dL — ABNORMAL HIGH (ref 5–40)

## 2016-09-20 NOTE — Progress Notes (Signed)
Big improvement in her lipid panel.  I would like her to continue what she is doing and we can recheck these on follow up.  Betty BostonMichael Clark, MS, PA-C 11:55 AM, 09/20/2016

## 2016-10-03 ENCOUNTER — Other Ambulatory Visit: Payer: Self-pay | Admitting: Physician Assistant

## 2017-02-03 ENCOUNTER — Ambulatory Visit: Payer: Medicare Other | Admitting: Physician Assistant

## 2017-02-03 ENCOUNTER — Other Ambulatory Visit: Payer: Self-pay

## 2017-02-03 ENCOUNTER — Encounter: Payer: Self-pay | Admitting: Physician Assistant

## 2017-02-03 VITALS — BP 142/84 | HR 70 | Temp 98.5°F | Resp 16 | Ht 68.0 in | Wt 172.2 lb

## 2017-02-03 DIAGNOSIS — Z789 Other specified health status: Secondary | ICD-10-CM | POA: Insufficient documentation

## 2017-02-03 DIAGNOSIS — G44209 Tension-type headache, unspecified, not intractable: Secondary | ICD-10-CM | POA: Diagnosis not present

## 2017-02-03 DIAGNOSIS — E119 Type 2 diabetes mellitus without complications: Secondary | ICD-10-CM | POA: Diagnosis not present

## 2017-02-03 LAB — POCT CBC
Granulocyte percent: 51.4 %G (ref 37–80)
HCT, POC: 40.9 % (ref 37.7–47.9)
HEMOGLOBIN: 13.8 g/dL (ref 12.2–16.2)
LYMPH, POC: 2.4 (ref 0.6–3.4)
MCH, POC: 29.8 pg (ref 27–31.2)
MCHC: 33.7 g/dL (ref 31.8–35.4)
MCV: 88.4 fL (ref 80–97)
MID (CBC): 0.5 (ref 0–0.9)
MPV: 8.7 fL (ref 0–99.8)
POC Granulocyte: 3.1 (ref 2–6.9)
POC LYMPH PERCENT: 40.1 %L (ref 10–50)
POC MID %: 8.5 % (ref 0–12)
Platelet Count, POC: 246 10*3/uL (ref 142–424)
RBC: 4.62 M/uL (ref 4.04–5.48)
RDW, POC: 13 %
WBC: 6.1 10*3/uL (ref 4.6–10.2)

## 2017-02-03 LAB — POCT URINALYSIS DIP (MANUAL ENTRY)
Bilirubin, UA: NEGATIVE
Glucose, UA: NEGATIVE mg/dL
Ketones, POC UA: NEGATIVE mg/dL
Leukocytes, UA: NEGATIVE
Nitrite, UA: NEGATIVE
PROTEIN UA: NEGATIVE mg/dL
RBC UA: NEGATIVE
SPEC GRAV UA: 1.015 (ref 1.010–1.025)
UROBILINOGEN UA: 0.2 U/dL
pH, UA: 6.5 (ref 5.0–8.0)

## 2017-02-03 LAB — POCT GLYCOSYLATED HEMOGLOBIN (HGB A1C): Hemoglobin A1C: 6.8

## 2017-02-03 MED ORDER — CYCLOBENZAPRINE HCL 5 MG PO TABS: 5.0000 mg | ORAL_TABLET | Freq: Three times a day (TID) | ORAL | 1 refills | Status: DC | PRN

## 2017-02-03 NOTE — Progress Notes (Signed)
02/03/2017 10:29 AM   DOB: 1950-06-30 / MRN: [id number]  SUBJECTIVE:  Betty Gonzalez is a 66 y.o. female presenting for posterior left dull HA that is worse at night and seems to go away during the day. She has a history of diabetes that is well controlled and she did check her sugar at roughly 140 which is mildly elevated for her. Denies weakness and numbness in her hands. Denies any dysuria. Reports feeling more stressed due to holiday expectations.   She  has a past medical history of Chronic kidney disease, Diabetes mellitus without complication (HCC), and Hyperlipidemia.    She  reports that she has quit smoking. she has never used smokeless tobacco. She reports that she does not drink alcohol or use drugs. She  has no sexual activity history on file. The patient  has no past surgical history on file.  Her family history includes Dementia in her mother.  Review of Systems  Constitutional: Negative for chills, diaphoresis and fever.  Eyes: Negative.   Respiratory: Negative for cough, hemoptysis, sputum production, shortness of breath and wheezing.   Cardiovascular: Negative for chest pain, orthopnea and leg swelling.  Gastrointestinal: Negative for nausea.  Skin: Negative for rash.  Neurological: Positive for headaches. Negative for dizziness, tingling, tremors, sensory change, speech change, focal weakness, seizures and loss of consciousness.    The problem list and medications were reviewed and updated by myself where necessary and exist elsewhere in the encounter.   OBJECTIVE:  BP (!) 142/84 (BP Location: Right Arm, Patient Position: Sitting, Cuff Size: Normal)   Pulse 70   Temp 98.5 F (36.9 C) (Oral)   Resp 16   Ht 5\' 8"  (1.727 m)   Wt 172 lb 3.2 oz (78.1 kg)   SpO2 97%   BMI 26.18 kg/m   Wt Readings from Last 3 Encounters:  02/03/17 172 lb 3.2 oz (78.1 kg)  09/19/16 167 lb 6.4 oz (75.9 kg)  06/17/16 170 lb 12.8 oz (77.5 kg)   Temp Readings from Last 3  Encounters:  02/03/17 98.5 F (36.9 C) (Oral)  09/19/16 98.3 F (36.8 C) (Oral)  06/17/16 98.1 F (36.7 C) (Oral)   BP Readings from Last 3 Encounters:  02/03/17 (!) 142/84  09/19/16 124/74  09/08/16 120/73   Pulse Readings from Last 3 Encounters:  02/03/17 70  09/19/16 69  09/08/16 76     Physical Exam  Constitutional: She is oriented to person, place, and time. She appears well-developed. No distress.  HENT:  Head:    Cardiovascular: Normal rate.  Pulmonary/Chest: Effort normal and breath sounds normal.  Musculoskeletal: Normal range of motion.  Neurological: She is alert and oriented to person, place, and time. She has normal reflexes. She displays no atrophy, no tremor and normal reflexes. No cranial nerve deficit or sensory deficit. She exhibits normal muscle tone. She displays no seizure activity. Coordination and gait normal.  Skin: She is not diaphoretic.    Lab Results  Component Value Date   WBC 6.1 02/03/2017   HGB 13.8 02/03/2017   HCT 40.9 02/03/2017   MCV 88.4 02/03/2017   PLT 274 08/22/2015    Lab Results  Component Value Date   CREATININE 0.89 02/01/2016   BUN 15 02/01/2016   NA 142 02/01/2016   K 3.7 02/01/2016   CL 100 02/01/2016   CO2 25 02/01/2016    Lab Results  Component Value Date   ALT 21 02/01/2016   AST 27 02/01/2016  ALKPHOS 90 02/01/2016   BILITOT 0.3 02/01/2016    Lab Results  Component Value Date   TSH 1.790 06/17/2016    Lab Results  Component Value Date   HGBA1C 6.8 02/03/2017    Lab Results  Component Value Date   CHOL 234 (H) 09/19/2016   HDL 45 09/19/2016   LDLCALC 130 (H) 09/19/2016   TRIG 293 (H) 09/19/2016   CHOLHDL 5.2 (H) 09/19/2016     Results for orders placed or performed in visit on 02/03/17 (from the past 72 hour(s))  POCT CBC     Status: None   Collection Time: 02/03/17 10:22 AM  Result Value Ref Range   WBC 6.1 4.6 - 10.2 K/uL   Lymph, poc 2.4 0.6 - 3.4   POC LYMPH PERCENT 40.1 10 -  50 %L   MID (cbc) 0.5 0 - 0.9   POC MID % 8.5 0 - 12 %M   POC Granulocyte 3.1 2 - 6.9   Granulocyte percent 51.4 37 - 80 %G   RBC 4.62 4.04 - 5.48 M/uL   Hemoglobin 13.8 12.2 - 16.2 g/dL   HCT, POC 11.940.9 14.737.7 - 47.9 %   MCV 88.4 80 - 97 fL   MCH, POC 29.8 27 - 31.2 pg   MCHC 33.7 31.8 - 35.4 g/dL   RDW, POC 82.913.0 %   Platelet Count, POC 246 142 - 424 K/uL   MPV 8.7 0 - 99.8 fL  POCT urinalysis dipstick     Status: None   Collection Time: 02/03/17 10:27 AM  Result Value Ref Range   Color, UA yellow yellow   Clarity, UA clear clear   Glucose, UA negative negative mg/dL   Bilirubin, UA negative negative   Ketones, POC UA negative negative mg/dL   Spec Grav, UA 5.6211.015 3.0861.010 - 1.025   Blood, UA negative negative   pH, UA 6.5 5.0 - 8.0   Protein Ur, POC negative negative mg/dL   Urobilinogen, UA 0.2 0.2 or 1.0 E.U./dL   Nitrite, UA Negative Negative   Leukocytes, UA Negative Negative  POCT glycosylated hemoglobin (Hb A1C)     Status: None   Collection Time: 02/03/17 10:27 AM  Result Value Ref Range   Hemoglobin A1C 6.8     No results found.  ASSESSMENT AND PLAN:  [name] was seen today for headache.  Diagnoses and all orders for this visit:  Acute non intractable tension-type headache: Given tenderness on exam this is likely MSK. Possibly 2/2 to stress from the holidays.  Will treat symptomatically for now. RTC in about 1 week only if needed.  -     cyclobenzaprine (FLEXERIL) 5 MG tablet; Take 1 tablet (5 mg total) by mouth 3 (three) times daily as needed for muscle spasms. Do not mix with lorazepam.  Well controlled diabetes mellitus (HCC) -     POCT urinalysis dipstick -     POCT CBC -     POCT glycosylated hemoglobin (Hb A1C)    The patient is advised to call or return to clinic if she does not see an improvement in symptoms, or to seek the care of the closest emergency department if she worsens with the above plan.   Deliah BostonMichael Avanthika Dehnert, MHS, PA-C Primary Care at  [facility] 02/03/2017 10:29 AM

## 2017-02-03 NOTE — Patient Instructions (Addendum)
Continue tylenol 1000 mg every 6-8 hours.  Add in medium dose flexeril. Will see you in about one week if you are not feeling better and will consider imaging at that point.     IF you received an x-ray today, you will receive an invoice from [facility]. Please contact [facility] at [phone] with questions or concerns regarding your invoice.   IF you received labwork today, you will receive an invoice from [facility]. Please contact [facility] at [phone] with questions or concerns regarding your invoice.   Our billing staff will not be able to assist you with questions regarding bills from these companies.  You will be contacted with the lab results as soon as they are available. The fastest way to get your results is to activate your My Chart account. Instructions are located on the last page of this paperwork. If you have not heard from us regarding the results in 2 weeks, please contact this office.

## 2017-02-09 ENCOUNTER — Ambulatory Visit: Payer: Medicare Other | Admitting: Physician Assistant

## 2017-02-24 ENCOUNTER — Ambulatory Visit: Payer: Medicare Other | Admitting: Physician Assistant

## 2017-03-27 ENCOUNTER — Ambulatory Visit: Payer: Medicare Other | Admitting: Physician Assistant

## 2017-03-27 ENCOUNTER — Ambulatory Visit: Payer: Medicare Other

## 2017-03-27 ENCOUNTER — Other Ambulatory Visit: Payer: Self-pay

## 2017-03-27 ENCOUNTER — Encounter: Payer: Self-pay | Admitting: Family Medicine

## 2017-03-27 ENCOUNTER — Ambulatory Visit: Payer: Medicare Other | Admitting: Family Medicine

## 2017-03-27 VITALS — BP 120/84 | HR 88 | Temp 98.6°F | Ht 69.69 in | Wt 171.6 lb

## 2017-03-27 DIAGNOSIS — M25511 Pain in right shoulder: Secondary | ICD-10-CM | POA: Diagnosis not present

## 2017-03-27 DIAGNOSIS — E119 Type 2 diabetes mellitus without complications: Secondary | ICD-10-CM

## 2017-03-27 LAB — GLUCOSE, POCT (MANUAL RESULT ENTRY): POC Glucose: 103 mg/dl — AB (ref 70–99)

## 2017-03-27 MED ORDER — DICLOFENAC SODIUM 1 % TD GEL: 2.0000 g | Freq: Four times a day (QID) | TRANSDERMAL | 0 refills | Status: DC

## 2017-03-27 NOTE — Patient Instructions (Addendum)
     IF you received an x-ray today, you will receive an invoice from [facility]. Please contact [facility] at [phone] with questions or concerns regarding your invoice.   IF you received labwork today, you will receive an invoice from [facility]. Please contact [facility] at [phone] with questions or concerns regarding your invoice.   Our billing staff will not be able to assist you with questions regarding bills from these companies.  You will be contacted with the lab results as soon as they are available. The fastest way to get your results is to activate your My Chart account. Instructions are located on the last page of this paperwork. If you have not heard from us regarding the results in 2 weeks, please contact this office.        IF you received an x-ray today, you will receive an invoice from [facility]. Please contact [facility] at [phone] with questions or concerns regarding your invoice.   IF you received labwork today, you will receive an invoice from [facility]. Please contact [facility] at [phone] with questions or concerns regarding your invoice.   Our billing staff will not be able to assist you with questions regarding bills from these companies.  You will be contacted with the lab results as soon as they are available. The fastest way to get your results is to activate your My Chart account. Instructions are located on the last page of this paperwork. If you have not heard from us regarding the results in 2 weeks, please contact this office.     Shoulder Pain Many things can cause shoulder pain, including:  An injury to the area.  Overuse of the shoulder.  Arthritis.  The source of the pain can be:  Inflammation.  An injury to the shoulder joint.  An injury to a tendon, ligament, or bone.  Follow these instructions at home: Take these actions to help with your pain:  Squeeze a soft ball or a foam pad  as much as possible. This helps to keep the shoulder from swelling. It also helps to strengthen the arm.  Take over-the-counter and prescription medicines only as told by your health care provider.  If directed, apply ice to the area: ? Put ice in a plastic bag. ? Place a towel between your skin and the bag. ? Leave the ice on for 20 minutes, 2-3 times per day. Stop applying ice if it does not help with the pain.  If you were given a shoulder sling or immobilizer: ? Wear it as told. ? Remove it to shower or bathe. ? Move your arm as little as possible, but keep your hand moving to prevent swelling.  Contact a health care provider if:  Your pain gets worse.  Your pain is not relieved with medicines.  New pain develops in your arm, hand, or fingers. Get help right away if:  Your arm, hand, or fingers: ? Tingle. ? Become numb. ? Become swollen. ? Become painful. ? Turn white or blue. This information is not intended to replace advice given to you by your health care provider. Make sure you discuss any questions you have with your health care provider. Document Released: 11/10/2004 Document Revised: 09/27/2015 Document Reviewed: 05/26/2014 Elsevier Interactive Patient Education  [employer]2018 Elsevier Inc[employer]

## 2017-03-27 NOTE — Progress Notes (Signed)
2/11/20198:45 AM  Betty Gonzalez 06/27/1950, 67 y.o. female [id number]  Chief Complaint  Patient presents with  . Shoulder Pain    having right shoulder pain x 2 weeks    HPI:   Patient is a 67 y.o. female with past medical history significant for DM2 who presents today for 2 weeks of right shoulder pain that started after she has been doing tai-chi several times. She denies any trauma or previous problems this shoulder. She reports that pain is mostly in space between shoulder and neck, anterior chest wall and it goes down her arm. She denies any neck pain. Denies any numbness or tingling. Has tried ointments, not sleeping on her shoulder without benefit. She denies any swelling, redness or warmth of the shoulder.  Depression screen [facility] 2/9 03/27/2017 02/03/2017 09/19/2016  Decreased Interest 0 0 0  Down, Depressed, Hopeless 0 0 0  PHQ - 2 Score 0 0 0    Allergies  Allergen Reactions  . Codeine   . Other     Thermasol - active ingredient in the flu shot -- made eyes swell up  Statins - affects muscles in legs  . Sulfonamide Derivatives   . Tape Rash and Other (See Comments)    Adhesive Tape;  Blisters     Prior to Admission medications   Medication Sig Start Date End Date Taking? Authorizing Provider  LORazepam (ATIVAN) 1 MG tablet Take 1 tablet (1 mg total) by mouth daily as needed for anxiety. No refills until 09/29/2017 09/19/16  Yes Tereasa Coop, PA-C  metFORMIN (GLUCOPHAGE XR) 500 MG 24 hr tablet Take 1 tablet (500 mg total) by mouth daily with breakfast. 09/19/16  Yes Tereasa Coop, PA-C  blood glucose meter kit and supplies KIT Dispense based on patient and insurance preference. Use up to check cbgs qd - either fasting qam or 2 hours after a meal, E11.9 Patient not taking: Reported on 02/03/2017 02/01/16   Shawnee Knapp, MD  cyclobenzaprine (FLEXERIL) 5 MG tablet Take 1 tablet (5 mg total) by mouth 3 (three) times daily as needed for muscle spasms. Do not mix with  lorazepam. Patient not taking: Reported on 03/27/2017 02/03/17   Hillis Range    Past Medical History:  Diagnosis Date  . Chronic kidney disease   . Diabetes mellitus without complication ([place])   . Hyperlipidemia     History reviewed. No pertinent surgical history.  Social History   Tobacco Use  . Smoking status: Former Research scientist (life sciences)  . Smokeless tobacco: Never Used  Substance Use Topics  . Alcohol use: No    Alcohol/week: 0.0 oz    Family History  Problem Relation Age of Onset  . Dementia Mother     ROS Per hpi  OBJECTIVE:  Blood pressure 120/84, pulse 88, temperature 98.6 F (37 C), temperature source Oral, height 5' 9.69" (1.77 m), weight 171 lb 9.6 oz (77.8 kg), SpO2 94 %.  Physical Exam  Constitutional: She is well-developed, well-nourished, and in no distress.  Musculoskeletal:       Right shoulder: Normal. She exhibits normal range of motion, no bony tenderness, no swelling, no pain (neg neers, hawkins, drop arch, lift off) and normal strength.       Arms:   ASSESSMENT and PLAN  1. Acute pain of right shoulder Discussed with patient that exam is suggestive of muscular etiology from recent tai-chi. No concerns for frank shoulder pathology, discussed supportive measures and RTC precautions. Patient reports being told  by nephrologist that she should avoid all oral NSAIDs, therefore doing topical nsaids instead.   - diclofenac sodium (VOLTAREN) 1 % GEL; Apply 2 g topically 4 (four) times daily.  Return if symptoms worsen or fail to improve.    Rutherford Guys, MD Primary Care at [facility], [place] [address] Ph.  [phone] Fax [phone]

## 2017-04-27 ENCOUNTER — Encounter: Payer: Self-pay | Admitting: Physician Assistant

## 2017-04-27 ENCOUNTER — Ambulatory Visit: Payer: Medicare Other | Admitting: Physician Assistant

## 2017-04-27 DIAGNOSIS — M5412 Radiculopathy, cervical region: Secondary | ICD-10-CM | POA: Diagnosis not present

## 2017-04-27 MED ORDER — PREDNISONE 20 MG PO TABS: ORAL_TABLET | ORAL | 0 refills | Status: AC

## 2017-04-27 MED ORDER — CYCLOBENZAPRINE HCL 5 MG PO TABS: 5.0000 mg | ORAL_TABLET | Freq: Every day | ORAL | 1 refills | Status: DC

## 2017-04-27 NOTE — Patient Instructions (Addendum)
Start the pred and restart the flexeril.  COme back in 2 weeks.    Come back in about 6 weeks for a diabetic check.      IF you received an x-ray today, you will receive an invoice from [facility]. Please contact [facility] at [phone] with questions or concerns regarding your invoice.   IF you received labwork today, you will receive an invoice from [facility]. Please contact [facility] at [phone] with questions or concerns regarding your invoice.   Our billing staff will not be able to assist you with questions regarding bills from these companies.  You will be contacted with the lab results as soon as they are available. The fastest way to get your results is to activate your My Chart account. Instructions are located on the last page of this paperwork. If you have not heard from us regarding the results in 2 weeks, please contact this office.

## 2017-04-27 NOTE — Progress Notes (Signed)
05/03/2017 9:20 AM   DOB: 18-Jan-1951 / MRN: [id number]  SUBJECTIVE:  Betty Gonzalez is a 67 y.o. female presenting for right-sided shoulder pain that radiates to  just distal the right elbow.  States the pain waxes and wanes.  Was seen previously advised to take ibuprofen.  She feels the pain is getting worse.  I had seen her previously for a neck issue that improved with Flexeril.  She has a history of well-controlled diabetes and does take metformin as prescribed without missing doses.  She denies weakness of the right upper extremity.  She is allergic to codeine; other; sulfonamide derivatives; and tape.   She  has a past medical history of Chronic kidney disease, Diabetes mellitus without complication (HCC), and Hyperlipidemia.    She  reports that she has quit smoking. she has never used smokeless tobacco. She reports that she does not drink alcohol or use drugs. She  has no sexual activity history on file. The patient  has no past surgical history on file.  Her family history includes Dementia in her mother.  Review of Systems  Constitutional: Negative for chills, diaphoresis and fever.  Respiratory: Negative for cough, hemoptysis, sputum production, shortness of breath and wheezing.   Cardiovascular: Negative for chest pain, orthopnea and leg swelling.  Gastrointestinal: Negative for nausea.  Musculoskeletal: Positive for myalgias and neck pain. Negative for back pain, falls and joint pain.  Skin: Negative for rash.  Neurological: Negative for dizziness.    The problem list and medications were reviewed and updated by myself where necessary and exist elsewhere in the encounter.   OBJECTIVE:  BP 133/77   Pulse 85   Temp 98.3 F (36.8 C) (Oral)   Resp 16   Ht 5' 9.6" (1.768 m)   Wt 170 lb (77.1 kg)   SpO2 96%   BMI 24.67 kg/m   Physical Exam  Constitutional: She is oriented to person, place, and time. She is active.  Non-toxic appearance.  Cardiovascular: Normal rate.   Pulmonary/Chest: Effort normal. No tachypnea.  Musculoskeletal:       Arms: Neurological: She is alert and oriented to person, place, and time. She has normal reflexes. She displays no atrophy, no tremor and normal reflexes. No cranial nerve deficit or sensory deficit. She exhibits normal muscle tone. She displays no seizure activity. Coordination normal. GCS eye subscore is 4. GCS verbal subscore is 5. GCS motor subscore is 6.  Reflex Scores:      Tricep reflexes are 2+ on the right side.      Bicep reflexes are 2+ on the right side.      Brachioradialis reflexes are 2+ on the right side. Skin: Skin is warm and dry. She is not diaphoretic. No pallor.    Lab Results  Component Value Date   HGBA1C 6.8 02/03/2017   Lab Results  Component Value Date   WBC 6.1 02/03/2017   HGB 13.8 02/03/2017   HCT 40.9 02/03/2017   MCV 88.4 02/03/2017   PLT 274 08/22/2015     No results found for this or any previous visit (from the past 72 hour(s)).  No results found.  ASSESSMENT AND PLAN:  Laverle was seen today for arm pain.  Diagnoses and all orders for this visit:  Cervical radiculopathy at C6:  NSAIDs cause some GI symptoms for her.  I will treated with prednisone.  She has a history of very well-controlled diabetes I do not think the prednisone will have any significant  impact on her blood sugars.  I am prescribing Flexeril along with the prednisone.  Stopping the diclofenac as she never picked this. -     predniSONE (DELTASONE) 20 MG tablet; Take 3 in the morning for 3 days, then 2 in the morning for 3 days, and then 1 in the morning for 3 days. -     cyclobenzaprine (FLEXERIL) 5 MG tablet; Take 1-2 tablets (5-10 mg total) by mouth at bedtime. Do not mix with lorazepam.    The patient is advised to call or return to clinic if she does not see an improvement in symptoms, or to seek the care of the closest emergency department if she worsens with the above plan.   [name], MHS,  PA-C Primary Care at [facility] 05/03/2017 9:20 AM

## 2017-05-12 ENCOUNTER — Ambulatory Visit: Payer: Medicare Other | Admitting: Physician Assistant

## 2017-05-17 ENCOUNTER — Ambulatory Visit (INDEPENDENT_AMBULATORY_CARE_PROVIDER_SITE_OTHER): Payer: Medicare Other | Admitting: Physician Assistant

## 2017-05-17 ENCOUNTER — Other Ambulatory Visit: Payer: Self-pay

## 2017-05-17 ENCOUNTER — Ambulatory Visit (INDEPENDENT_AMBULATORY_CARE_PROVIDER_SITE_OTHER): Payer: Medicare Other

## 2017-05-17 ENCOUNTER — Encounter: Payer: Self-pay | Admitting: Physician Assistant

## 2017-05-17 VITALS — BP 116/70 | HR 88 | Temp 97.7°F | Resp 18 | Ht 69.6 in | Wt 169.8 lb

## 2017-05-17 DIAGNOSIS — R252 Cramp and spasm: Secondary | ICD-10-CM | POA: Diagnosis not present

## 2017-05-17 DIAGNOSIS — G8929 Other chronic pain: Secondary | ICD-10-CM | POA: Diagnosis not present

## 2017-05-17 DIAGNOSIS — M542 Cervicalgia: Secondary | ICD-10-CM

## 2017-05-17 NOTE — Progress Notes (Signed)
05/17/2017 9:21 AM   DOB: 03-25-1950 / MRN: [id number]  SUBJECTIVE:  Betty Gonzalez is a 67 y.o. female presenting for recurrence of neck pain with upper extremity radiculopathy.  She associates cramping in her legs.  She felt better physically while taking prednisone however this negatively impacted her sleep and she also felt "jittery." She has always had a normal MSK and neurological exam.   She is allergic to codeine; other; sulfonamide derivatives; and tape.   She  has a past medical history of Chronic kidney disease, Diabetes mellitus without complication (HCC), and Hyperlipidemia.    She  reports that she has quit smoking. She has never used smokeless tobacco. She reports that she does not drink alcohol or use drugs. She  has no sexual activity history on file. The patient  has no past surgical history on file.  Her family history includes Dementia in her mother.  Review of Systems  Constitutional: Negative for chills, diaphoresis and fever.  Respiratory: Negative for cough, hemoptysis, sputum production, shortness of breath and wheezing.   Cardiovascular: Negative for chest pain, orthopnea and leg swelling.  Gastrointestinal: Negative for nausea.  Musculoskeletal: Positive for back pain, myalgias and neck pain. Negative for falls and joint pain.  Skin: Negative for rash.  Neurological: Negative for dizziness, tingling and headaches.    The problem list and medications were reviewed and updated by myself where necessary and exist elsewhere in the encounter.   OBJECTIVE:  BP 116/70 (BP Location: Left Arm, Patient Position: Sitting, Cuff Size: Normal)   Pulse 88   Temp 97.7 F (36.5 C) (Oral)   Resp 18   Ht 5' 9.6" (1.768 m)   Wt 169 lb 12.8 oz (77 kg)   SpO2 97%   BMI 24.64 kg/m   Lab Results  Component Value Date   WBC 6.1 02/03/2017   HGB 13.8 02/03/2017   HCT 40.9 02/03/2017   MCV 88.4 02/03/2017   PLT 274 08/22/2015    Lab Results  Component Value Date   CREATININE 0.89 02/01/2016   BUN 15 02/01/2016   NA 142 02/01/2016   K 3.7 02/01/2016   CL 100 02/01/2016   CO2 25 02/01/2016    Lab Results  Component Value Date   ALT 21 02/01/2016   AST 27 02/01/2016   ALKPHOS 90 02/01/2016   BILITOT 0.3 02/01/2016    Lab Results  Component Value Date   TSH 1.790 06/17/2016    Lab Results  Component Value Date   HGBA1C 6.8 02/03/2017    Lab Results  Component Value Date   CHOL 234 (H) 09/19/2016   HDL 45 09/19/2016   LDLCALC 130 (H) 09/19/2016   TRIG 293 (H) 09/19/2016   CHOLHDL 5.2 (H) 09/19/2016     Physical Exam  Constitutional: She is active.  Non-toxic appearance.  Cardiovascular: Normal rate.  Pulmonary/Chest: Effort normal. No tachypnea.  Neurological: She is alert.  Skin: Skin is warm and dry. She is not diaphoretic. No pallor.    No results found for this or any previous visit (from the past 72 hour(s)).  Dg Cervical Spine 2 Or 3 Views  Result Date: 05/17/2017 CLINICAL DATA:  Cervicalgia with radicular symptoms EXAM: CERVICAL SPINE - 2-3 VIEW COMPARISON:  None. FINDINGS: Frontal, lateral, and open-mouth odontoid images were obtained. There is no fracture or spondylolisthesis. Prevertebral soft tissues and predental space regions are normal. There is moderately severe disc space narrowing at C5-6. There is moderate disc space narrowing at  C4-5. There is slightly milder disc space narrowing at C6-7 and C7-T1. There are anterior osteophytes at C4, C5, C6, and C7. There is calcification in the anterior ligament at C4-5, C5-6, C6-7, and C7-T1. No erosive change. Lung apices are clear. IMPRESSION: Multilevel osteoarthritic change.  No fracture or spondylolisthesis. Electronically Signed   By: Bretta BangWilliam  Woodruff III M.D.   On: 05/17/2017 09:17   Dg Lumbar Spine 2-3 Views  Result Date: 05/17/2017 CLINICAL DATA:  Lumbago EXAM: LUMBAR SPINE - 2-3 VIEW COMPARISON:  None. FINDINGS: Frontal, lateral, and spot lumbosacral lateral images  were obtained. There are 5 non-rib-bearing lumbar type vertebral bodies. There is no fracture or spondylolisthesis. Disc spaces appear unremarkable. No erosive change. There is aortic atherosclerosis. IMPRESSION: No fracture or spondylolisthesis. No appreciable arthropathy. There is aortic atherosclerosis. Aortic Atherosclerosis (ICD10-I70.0). Electronically Signed   By: Bretta BangWilliam  Woodruff III M.D.   On: 05/17/2017 09:18    ASSESSMENT AND PLAN:  Scotty was seen today for shoulder pain and follow-up.  Diagnoses and all orders for this visit:  Chronic neck pain -     Sedimentation Rate -     C-reactive protein -     DG Cervical Spine 2 or 3 views; Future -     DG Lumbar Spine 2-3 Views; Future  Leg cramping -     Sedimentation Rate -     C-reactive protein    The patient is advised to call or return to clinic if she does not see an improvement in symptoms, or to seek the care of the closest emergency department if she worsens with the above plan.   Deliah BostonMichael Devine Dant, MHS, PA-C Primary Care at [facility] 05/17/2017 9:21 AM

## 2017-05-17 NOTE — Patient Instructions (Addendum)
  Take 1000 mg every 8 as needed. Take an Aleve on bad days and no more than three tabs weekly.   Dg Cervical Spine 2 Or 3 Views  Result Date: 05/17/2017 CLINICAL DATA:  Cervicalgia with radicular symptoms EXAM: CERVICAL SPINE - 2-3 VIEW COMPARISON:  None. FINDINGS: Frontal, lateral, and open-mouth odontoid images were obtained. There is no fracture or spondylolisthesis. Prevertebral soft tissues and predental space regions are normal. There is moderately severe disc space narrowing at C5-6. There is moderate disc space narrowing at C4-5. There is slightly milder disc space narrowing at C6-7 and C7-T1. There are anterior osteophytes at C4, C5, C6, and C7. There is calcification in the anterior ligament at C4-5, C5-6, C6-7, and C7-T1. No erosive change. Lung apices are clear. IMPRESSION: Multilevel osteoarthritic change.  No fracture or spondylolisthesis. Electronically Signed   By: Bretta BangWilliam  Woodruff III M.D.   On: 05/17/2017 09:17   Dg Lumbar Spine 2-3 Views  Result Date: 05/17/2017 CLINICAL DATA:  Lumbago EXAM: LUMBAR SPINE - 2-3 VIEW COMPARISON:  None. FINDINGS: Frontal, lateral, and spot lumbosacral lateral images were obtained. There are 5 non-rib-bearing lumbar type vertebral bodies. There is no fracture or spondylolisthesis. Disc spaces appear unremarkable. No erosive change. There is aortic atherosclerosis. IMPRESSION: No fracture or spondylolisthesis. No appreciable arthropathy. There is aortic atherosclerosis. Aortic Atherosclerosis (ICD10-I70.0). Electronically Signed   By: Bretta BangWilliam  Woodruff III M.D.   On: 05/17/2017 09:18    IF you received an x-ray today, you will receive an invoice from [facility]. Please contact [facility] at [phone] with questions or concerns regarding your invoice.   IF you received labwork today, you will receive an invoice from [facility]. Please contact [facility] at [phone] with questions or concerns regarding your invoice.   Our billing  staff will not be able to assist you with questions regarding bills from these companies.  You will be contacted with the lab results as soon as they are available. The fastest way to get your results is to activate your My Chart account. Instructions are located on the last page of this paperwork. If you have not heard from us regarding the results in 2 weeks, please contact this office.

## 2017-05-18 LAB — C-REACTIVE PROTEIN: CRP: 4.6 mg/L (ref 0.0–4.9)

## 2017-05-18 LAB — SEDIMENTATION RATE: Sed Rate: 20 mm/hr (ref 0–40)

## 2017-07-11 ENCOUNTER — Encounter: Payer: Self-pay | Admitting: Family Medicine

## 2017-08-07 ENCOUNTER — Other Ambulatory Visit: Payer: Self-pay

## 2017-08-07 ENCOUNTER — Ambulatory Visit: Payer: Medicare Other | Admitting: Physician Assistant

## 2017-08-07 ENCOUNTER — Encounter: Payer: Self-pay | Admitting: Physician Assistant

## 2017-08-07 VITALS — BP 146/71 | HR 77 | Temp 97.5°F | Resp 18 | Ht 69.6 in | Wt 170.0 lb

## 2017-08-07 DIAGNOSIS — Z299 Encounter for prophylactic measures, unspecified: Secondary | ICD-10-CM | POA: Diagnosis not present

## 2017-08-07 DIAGNOSIS — W57XXXA Bitten or stung by nonvenomous insect and other nonvenomous arthropods, initial encounter: Secondary | ICD-10-CM

## 2017-08-07 DIAGNOSIS — R1012 Left upper quadrant pain: Secondary | ICD-10-CM | POA: Diagnosis not present

## 2017-08-07 DIAGNOSIS — S80861A Insect bite (nonvenomous), right lower leg, initial encounter: Secondary | ICD-10-CM

## 2017-08-07 DIAGNOSIS — M791 Myalgia, unspecified site: Secondary | ICD-10-CM | POA: Diagnosis not present

## 2017-08-07 DIAGNOSIS — R5383 Other fatigue: Secondary | ICD-10-CM

## 2017-08-07 DIAGNOSIS — R509 Fever, unspecified: Secondary | ICD-10-CM

## 2017-08-07 DIAGNOSIS — I781 Nevus, non-neoplastic: Secondary | ICD-10-CM

## 2017-08-07 LAB — POCT CBC
Granulocyte percent: 67.6 %G (ref 37–80)
HEMATOCRIT: 43.4 % (ref 37.7–47.9)
Hemoglobin: 14.1 g/dL (ref 12.2–16.2)
LYMPH, POC: 1.9 (ref 0.6–3.4)
MCH, POC: 29.6 pg (ref 27–31.2)
MCHC: 32.6 g/dL (ref 31.8–35.4)
MCV: 90.9 fL (ref 80–97)
MID (cbc): 0.4 (ref 0–0.9)
MPV: 9 fL (ref 0–99.8)
POC GRANULOCYTE: 4.9 (ref 2–6.9)
POC LYMPH PERCENT: 26.8 %L (ref 10–50)
POC MID %: 5.6 % (ref 0–12)
Platelet Count, POC: 265 10*3/uL (ref 142–424)
RBC: 4.77 M/uL (ref 4.04–5.48)
RDW, POC: 13.4 %
WBC: 7.2 10*3/uL (ref 4.6–10.2)

## 2017-08-07 LAB — POCT URINALYSIS DIP (MANUAL ENTRY)
Bilirubin, UA: NEGATIVE
Glucose, UA: NEGATIVE mg/dL
Ketones, POC UA: NEGATIVE mg/dL
Leukocytes, UA: NEGATIVE
NITRITE UA: NEGATIVE
PROTEIN UA: NEGATIVE mg/dL
RBC UA: NEGATIVE
Spec Grav, UA: 1.02 (ref 1.010–1.025)
UROBILINOGEN UA: 0.2 U/dL
pH, UA: 5.5 (ref 5.0–8.0)

## 2017-08-07 MED ORDER — FLUCONAZOLE 150 MG PO TABS: 150.0000 mg | ORAL_TABLET | Freq: Once | ORAL | 0 refills | Status: AC

## 2017-08-07 MED ORDER — DOXYCYCLINE HYCLATE 100 MG PO CAPS: 100.0000 mg | ORAL_CAPSULE | Freq: Two times a day (BID) | ORAL | 0 refills | Status: AC

## 2017-08-07 MED ORDER — DOXYCYCLINE HYCLATE 100 MG PO CAPS: 100.0000 mg | ORAL_CAPSULE | Freq: Two times a day (BID) | ORAL | 0 refills | Status: DC

## 2017-08-07 NOTE — Patient Instructions (Addendum)
   Start the Doxy. Your EKG is perfect.  I'll see you in about three weeks if you not getting better.  I'd like to see you sooner if you are worsening.  Wear sunscreen while taking doxycycline.   IF you received an x-ray today, you will receive an invoice from [facility]. Please contact [facility] at [phone] with questions or concerns regarding your invoice.   IF you received labwork today, you will receive an invoice from [facility]. Please contact [facility] at [phone] with questions or concerns regarding your invoice.   Our billing staff will not be able to assist you with questions regarding bills from these companies.  You will be contacted with the lab results as soon as they are available. The fastest way to get your results is to activate your My Chart account. Instructions are located on the last page of this paperwork. If you have not heard from us regarding the results in 2 weeks, please contact this office.

## 2017-08-07 NOTE — Progress Notes (Signed)
me

## 2017-08-07 NOTE — Progress Notes (Signed)
08/07/2017 11:09 AM   DOB: 09/19/1950 / MRN: [id number]  SUBJECTIVE:  Betty Gonzalez is a 67 y.o. female presenting for HA, myalgia, photophobia, subjective fever.  Symptoms present for 2 weeks.  The problem is worsening. She has tried nothing. Started after a tic bite that was present 2 weeks previous. No rash.   She is allergic to codeine; other; sulfonamide derivatives; and tape.   She  has a past medical history of Chronic kidney disease, Diabetes mellitus without complication (HCC), and Hyperlipidemia.    She  reports that she has quit smoking. She has never used smokeless tobacco. She reports that she does not drink alcohol or use drugs. She  has no sexual activity history on file. The patient  has no past surgical history on file.  Her family history includes Dementia in her mother.  Review of Systems  Constitutional: Negative for chills, diaphoresis and fever.  Eyes: Negative.   Respiratory: Negative for cough, hemoptysis, sputum production, shortness of breath and wheezing.   Cardiovascular: Negative for chest pain, orthopnea and leg swelling.  Gastrointestinal: Negative for abdominal pain, blood in stool, constipation, diarrhea, heartburn, melena, nausea and vomiting.  Genitourinary: Negative for dysuria, flank pain, frequency, hematuria and urgency.  Skin: Negative for rash.  Neurological: Negative for dizziness, sensory change, speech change, focal weakness and headaches.    The problem list and medications were reviewed and updated by myself where necessary and exist elsewhere in the encounter.   OBJECTIVE:  BP (!) 146/71   Pulse 77   Temp (!) 97.5 F (36.4 C) (Oral)   Resp 18   Ht 5' 9.6" (1.768 m)   Wt 170 lb (77.1 kg)   SpO2 96%   BMI 24.67 kg/m   Wt Readings from Last 3 Encounters:  08/07/17 170 lb (77.1 kg)  05/17/17 169 lb 12.8 oz (77 kg)  04/27/17 170 lb (77.1 kg)   Temp Readings from Last 3 Encounters:  08/07/17 (!) 97.5 F (36.4 C) (Oral)    05/17/17 97.7 F (36.5 C) (Oral)  04/27/17 98.3 F (36.8 C) (Oral)   BP Readings from Last 3 Encounters:  08/07/17 (!) 146/71  05/17/17 116/70  04/27/17 133/77   Pulse Readings from Last 3 Encounters:  08/07/17 77  05/17/17 88  04/27/17 85    Physical Exam  Constitutional: She is oriented to person, place, and time. She appears well-nourished.  Non-toxic appearance. No distress.  HENT:  Right Ear: Hearing, tympanic membrane, external ear and ear canal normal.  Left Ear: Hearing, tympanic membrane, external ear and ear canal normal.  Nose: Nose normal. Right sinus exhibits no maxillary sinus tenderness and no frontal sinus tenderness. Left sinus exhibits no maxillary sinus tenderness and no frontal sinus tenderness.  Mouth/Throat: Uvula is midline, oropharynx is clear and moist and mucous membranes are normal. Mucous membranes are not dry. No oropharyngeal exudate, posterior oropharyngeal edema or tonsillar abscesses.  Eyes: Pupils are equal, round, and reactive to light. EOM are normal.  Cardiovascular: Normal rate, regular rhythm, S1 normal, S2 normal, normal heart sounds and intact distal pulses. Exam reveals no gallop, no friction rub and no decreased pulses.  No murmur heard. Pulmonary/Chest: Effort normal. No stridor. No respiratory distress. She has no wheezes. She has no rales.  Abdominal: She exhibits no distension and no mass. There is no tenderness. There is no rebound and no guarding. No hernia.  Musculoskeletal: She exhibits no edema.  Lymphadenopathy:       Head (right  side): No submandibular and no tonsillar adenopathy present.       Head (left side): No submandibular and no tonsillar adenopathy present.    She has no cervical adenopathy.  Neurological: She is alert and oriented to person, place, and time. She has normal strength and normal reflexes. She is not disoriented. She displays no atrophy. No cranial nerve deficit or sensory deficit. She exhibits normal  muscle tone. Coordination and gait normal.  Skin: Skin is warm and dry. No rash noted. She is not diaphoretic. No erythema. No pallor.  Psychiatric: She has a normal mood and affect. Her behavior is normal. Judgment and thought content normal.  Vitals reviewed.   Lab Results  Component Value Date   HGBA1C 6.8 02/03/2017    Lab Results  Component Value Date   WBC 7.2 08/07/2017   HGB 14.1 08/07/2017   HCT 43.4 08/07/2017   MCV 90.9 08/07/2017   PLT 274 08/22/2015    Lab Results  Component Value Date   CREATININE 0.89 02/01/2016   BUN 15 02/01/2016   NA 142 02/01/2016   K 3.7 02/01/2016   CL 100 02/01/2016   CO2 25 02/01/2016    Lab Results  Component Value Date   ALT 21 02/01/2016   AST 27 02/01/2016   ALKPHOS 90 02/01/2016   BILITOT 0.3 02/01/2016    Lab Results  Component Value Date   TSH 1.790 06/17/2016    Lab Results  Component Value Date   CHOL 234 (H) 09/19/2016   HDL 45 09/19/2016   LDLCALC 130 (H) 09/19/2016   TRIG 293 (H) 09/19/2016   CHOLHDL 5.2 (H) 09/19/2016   Results for orders placed or performed in visit on 08/07/17  POCT urinalysis dipstick  Result Value Ref Range   Color, UA yellow yellow   Clarity, UA clear clear   Glucose, UA negative negative mg/dL   Bilirubin, UA negative negative   Ketones, POC UA negative negative mg/dL   Spec Grav, UA 1.610 9.604 - 1.025   Blood, UA negative negative   pH, UA 5.5 5.0 - 8.0   Protein Ur, POC negative negative mg/dL   Urobilinogen, UA 0.2 0.2 or 1.0 E.U./dL   Nitrite, UA Negative Negative   Leukocytes, UA Negative Negative  POCT CBC  Result Value Ref Range   WBC 7.2 4.6 - 10.2 K/uL   Lymph, poc 1.9 0.6 - 3.4   POC LYMPH PERCENT 26.8 10 - 50 %L   MID (cbc) 0.4 0 - 0.9   POC MID % 5.6 0 - 12 %M   POC Granulocyte 4.9 2 - 6.9   Granulocyte percent 67.6 37 - 80 %G   RBC 4.77 4.04 - 5.48 M/uL   Hemoglobin 14.1 12.2 - 16.2 g/dL   HCT, POC 54.0 98.1 - 47.9 %   MCV 90.9 80 - 97 fL   MCH, POC  29.6 27 - 31.2 pg   MCHC 32.6 31.8 - 35.4 g/dL   RDW, POC 19.1 %   Platelet Count, POC 265 142 - 424 K/uL   MPV 9.0 0 - 99.8 fL      ASSESSMENT AND PLAN:  Maral was seen today for fatigue, generalized body aches and dysuria.  Diagnoses and all orders for this visit:  Other fatigue  Myalgia -     Hepatic Function Panel -     Lyme Ab/Western Blot Reflex  LUQ pain: Possibly splenic.  No TTP or mass on exam.  -  EKG 12-Lead -     Lyme Ab/Western Blot Reflex  Need for prophylactic measure -     fluconazole (DIFLUCAN) 150 MG tablet; Take 1 tablet (150 mg total) by mouth once for 1 dose. Repeat if needed  Fever and chills -     POCT urinalysis dipstick -     POCT CBC  Tick bite of lower leg, right, initial encounter  Other orders -     Discontinue: doxycycline (VIBRAMYCIN) 100 MG capsule; Take 1 capsule (100 mg total) by mouth 2 (two) times daily for 10 days. -     doxycycline (VIBRAMYCIN) 100 MG capsule; Take 1 capsule (100 mg total) by mouth 2 (two) times daily for 21 days.    The patient is advised to call or return to clinic if she does not see an improvement in symptoms, or to seek the care of the closest emergency department if she worsens with the above plan.   Deliah BostonMichael Lovie Agresta, MHS, PA-C Primary Care at [facility] 08/07/2017 11:09 AM

## 2017-08-10 LAB — LYME, WESTERN BLOT, SERUM (REFLEXED)
IGG P23 AB.: ABSENT
IGG P66 AB.: ABSENT
IGM P41 AB.: ABSENT
IgG P18 Ab.: ABSENT
IgG P28 Ab.: ABSENT
IgG P30 Ab.: ABSENT
IgG P39 Ab.: ABSENT
IgG P45 Ab.: ABSENT
IgG P58 Ab.: ABSENT
IgG P93 Ab.: ABSENT
LYME IGG WB: NEGATIVE
LYME IGM WB: POSITIVE — AB

## 2017-08-10 LAB — HEPATIC FUNCTION PANEL
ALK PHOS: 86 IU/L (ref 39–117)
ALT: 17 IU/L (ref 0–32)
AST: 19 IU/L (ref 0–40)
Albumin: 4.6 g/dL (ref 3.6–4.8)
BILIRUBIN, DIRECT: 0.09 mg/dL (ref 0.00–0.40)
Bilirubin Total: 0.3 mg/dL (ref 0.0–1.2)
Total Protein: 7.5 g/dL (ref 6.0–8.5)

## 2017-08-10 LAB — LYME AB/WESTERN BLOT REFLEX: LYME DISEASE AB, QUANT, IGM: 2.56 index — ABNORMAL HIGH (ref 0.00–0.79)

## 2017-08-11 ENCOUNTER — Encounter: Payer: Self-pay | Admitting: *Deleted

## 2017-08-15 ENCOUNTER — Telehealth: Payer: Self-pay | Admitting: Physician Assistant

## 2017-08-15 NOTE — Telephone Encounter (Signed)
Spoke with pt regarding referral to see if she wanted to go [place] and she would like to. She also wanted Casimiro NeedleMichael to know she is feeling better, not great, but better and her eyes feel especially better. She said she will be keeping her appt with Casimiro NeedleMichael on 08/28/17. Thanks!

## 2017-08-28 ENCOUNTER — Encounter: Payer: Self-pay | Admitting: Physician Assistant

## 2017-08-28 ENCOUNTER — Ambulatory Visit (INDEPENDENT_AMBULATORY_CARE_PROVIDER_SITE_OTHER): Payer: Medicare Other | Admitting: Physician Assistant

## 2017-08-28 ENCOUNTER — Other Ambulatory Visit: Payer: Self-pay

## 2017-08-28 VITALS — BP 117/64 | HR 78 | Temp 98.6°F | Resp 18 | Ht 69.6 in | Wt 167.2 lb

## 2017-08-28 DIAGNOSIS — A6920 Lyme disease, unspecified: Secondary | ICD-10-CM | POA: Diagnosis not present

## 2017-08-28 DIAGNOSIS — L568 Other specified acute skin changes due to ultraviolet radiation: Secondary | ICD-10-CM

## 2017-08-28 NOTE — Patient Instructions (Signed)
     IF you received an x-ray today, you will receive an invoice from [facility]. Please contact  Radiology at [phone] with questions or concerns regarding your invoice.   IF you received labwork today, you will receive an invoice from [facility]. Please contact [facility] at [phone] with questions or concerns regarding your invoice.   Our billing staff will not be able to assist you with questions regarding bills from these companies.  You will be contacted with the lab results as soon as they are available. The fastest way to get your results is to activate your My Chart account. Instructions are located on the last page of this paperwork. If you have not heard from us regarding the results in 2 weeks, please contact this office.     

## 2017-08-28 NOTE — Progress Notes (Signed)
08/28/2017 9:14 AM   DOB: January 02, 1951 / MRN: [id number]  SUBJECTIVE:  Betty Gonzalez is a 67 y.o. female presenting for follow up of tic bite. Has been taking doxycycline 21 day course after coming in with some constitutional symptoms and subsequently testing positive for lyme IgM.    She is allergic to codeine; other; sulfonamide derivatives; and tape.   She  has a past medical history of Chronic kidney disease, Diabetes mellitus without complication (HCC), and Hyperlipidemia.    She  reports that she has quit smoking. She has never used smokeless tobacco. She reports that she does not drink alcohol or use drugs. She  has no sexual activity history on file. The patient  has no past surgical history on file.  Her family history includes Dementia in her mother.  Review of Systems  Constitutional: Positive for malaise/fatigue. Negative for chills, diaphoresis and fever.  Eyes: Positive for photophobia. Negative for blurred vision, double vision, pain, discharge and redness.  Respiratory: Negative for cough.   Gastrointestinal: Negative for nausea.  Musculoskeletal: Positive for myalgias.  Skin: Negative for rash.  Neurological: Negative for dizziness.    The problem list and medications were reviewed and updated by myself where necessary and exist elsewhere in the encounter.   OBJECTIVE:  BP 117/64   Pulse 78   Temp 98.6 F (37 C) (Oral)   Resp 18   Ht 5' 9.6" (1.768 m)   Wt 167 lb 3.2 oz (75.8 kg)   SpO2 97%   BMI 24.27 kg/m   Wt Readings from Last 3 Encounters:  08/28/17 167 lb 3.2 oz (75.8 kg)  08/07/17 170 lb (77.1 kg)  05/17/17 169 lb 12.8 oz (77 kg)   Temp Readings from Last 3 Encounters:  08/28/17 98.6 F (37 C) (Oral)  08/07/17 (!) 97.5 F (36.4 C) (Oral)  05/17/17 97.7 F (36.5 C) (Oral)   BP Readings from Last 3 Encounters:  08/28/17 117/64  08/07/17 (!) 146/71  05/17/17 116/70   Pulse Readings from Last 3 Encounters:  08/28/17 78  08/07/17 77    05/17/17 88    Physical Exam  Constitutional: She is oriented to person, place, and time. She appears well-nourished.  Non-toxic appearance. No distress.  Eyes: Pupils are equal, round, and reactive to light. EOM are normal.  Cardiovascular: Normal rate, regular rhythm, S1 normal, S2 normal, normal heart sounds and intact distal pulses. Exam reveals no gallop, no friction rub and no decreased pulses.  No murmur heard. Pulmonary/Chest: Effort normal. No stridor. No respiratory distress. She has no wheezes. She has no rales.  Abdominal: She exhibits no distension.  Musculoskeletal: She exhibits no edema.  Neurological: She is alert and oriented to person, place, and time. No cranial nerve deficit. Gait normal.  Skin: Skin is warm and dry. She is not diaphoretic. No pallor.  Psychiatric: She has a normal mood and affect.  Vitals reviewed.   Lab Results  Component Value Date   HGBA1C 6.8 02/03/2017    Lab Results  Component Value Date   WBC 7.2 08/07/2017   HGB 14.1 08/07/2017   HCT 43.4 08/07/2017   MCV 90.9 08/07/2017   PLT 274 08/22/2015    Lab Results  Component Value Date   CREATININE 0.89 02/01/2016   BUN 15 02/01/2016   NA 142 02/01/2016   K 3.7 02/01/2016   CL 100 02/01/2016   CO2 25 02/01/2016    Lab Results  Component Value Date   ALT 17  08/07/2017   AST 19 08/07/2017   ALKPHOS 86 08/07/2017   BILITOT 0.3 08/07/2017    Lab Results  Component Value Date   TSH 1.790 06/17/2016    Lab Results  Component Value Date   CHOL 234 (H) 09/19/2016   HDL 45 09/19/2016   LDLCALC 130 (H) 09/19/2016   TRIG 293 (H) 09/19/2016   CHOLHDL 5.2 (H) 09/19/2016     ASSESSMENT AND PLAN:  Betty Gonzalez was seen today for tick removal.  Diagnoses and all orders for this visit:  Acute Lyme disease Comments: Patient 60% better.  She is willing to go to ID given that she is not closer to %100 percent.  Could be post lyme sydrome.  Orders: -     Lyme Ab/Western Blot  Reflex -     Renal Function Panel -     Hepatic Function Panel -     CBC with Differential -     Ambulatory referral to Infectious Disease  Photosensitivity Comments: Worsened. Plans to go to optho today.     The patient is advised to call or return to clinic if she does not see an improvement in symptoms, or to seek the care of the closest emergency department if she worsens with the above plan.   Deliah BostonMichael Yavuz Kirby, MHS, PA-C Primary Care at [facility] 08/28/2017 9:14 AM

## 2017-09-01 LAB — CBC WITH DIFFERENTIAL/PLATELET
BASOS ABS: 0 10*3/uL (ref 0.0–0.2)
Basos: 0 %
EOS (ABSOLUTE): 0.2 10*3/uL (ref 0.0–0.4)
EOS: 4 %
HEMATOCRIT: 38.6 % (ref 34.0–46.6)
HEMOGLOBIN: 13.3 g/dL (ref 11.1–15.9)
Immature Grans (Abs): 0 10*3/uL (ref 0.0–0.1)
Immature Granulocytes: 0 %
LYMPHS: 38 %
Lymphocytes Absolute: 1.7 10*3/uL (ref 0.7–3.1)
MCH: 30 pg (ref 26.6–33.0)
MCHC: 34.5 g/dL (ref 31.5–35.7)
MCV: 87 fL (ref 79–97)
MONOCYTES: 11 %
Monocytes Absolute: 0.5 10*3/uL (ref 0.1–0.9)
Neutrophils Absolute: 2.2 10*3/uL (ref 1.4–7.0)
Neutrophils: 47 %
Platelets: 260 10*3/uL (ref 150–450)
RBC: 4.43 x10E6/uL (ref 3.77–5.28)
RDW: 14.1 % (ref 12.3–15.4)
WBC: 4.6 10*3/uL (ref 3.4–10.8)

## 2017-09-01 LAB — LYME, WESTERN BLOT, SERUM (REFLEXED)
IGG P18 AB.: ABSENT
IGG P23 AB.: ABSENT
IGG P30 AB.: ABSENT
IGG P45 AB.: ABSENT
IGM P41 AB.: ABSENT
IgG P28 Ab.: ABSENT
IgG P39 Ab.: ABSENT
IgG P58 Ab.: ABSENT
IgG P66 Ab.: ABSENT
IgG P93 Ab.: ABSENT
IgM P39 Ab.: ABSENT
Lyme IgG Wb: NEGATIVE
Lyme IgM Wb: NEGATIVE

## 2017-09-01 LAB — RENAL FUNCTION PANEL
ALBUMIN: 4.4 g/dL (ref 3.6–4.8)
BUN / CREAT RATIO: 17 (ref 12–28)
BUN: 16 mg/dL (ref 8–27)
CHLORIDE: 103 mmol/L (ref 96–106)
CO2: 25 mmol/L (ref 20–29)
Calcium: 9.6 mg/dL (ref 8.7–10.3)
Creatinine, Ser: 0.92 mg/dL (ref 0.57–1.00)
GFR, EST AFRICAN AMERICAN: 73 mL/min/{1.73_m2} (ref >59)
GFR, EST NON AFRICAN AMERICAN: 64 mL/min/{1.73_m2} (ref >59)
Glucose: 101 mg/dL — ABNORMAL HIGH (ref 65–99)
Phosphorus: 3.9 mg/dL (ref 2.5–4.5)
Potassium: 4.5 mmol/L (ref 3.5–5.2)
SODIUM: 139 mmol/L (ref 134–144)

## 2017-09-01 LAB — HEPATIC FUNCTION PANEL
ALT: 15 IU/L (ref 0–32)
AST: 16 IU/L (ref 0–40)
Alkaline Phosphatase: 84 IU/L (ref 39–117)
Bilirubin Total: 0.3 mg/dL (ref 0.0–1.2)
Bilirubin, Direct: 0.09 mg/dL (ref 0.00–0.40)
Total Protein: 7.2 g/dL (ref 6.0–8.5)

## 2017-09-01 LAB — LYME AB/WESTERN BLOT REFLEX: LYME DISEASE AB, QUANT, IGM: 2.83 index — ABNORMAL HIGH (ref 0.00–0.79)

## 2017-09-14 ENCOUNTER — Ambulatory Visit: Payer: Medicare Other | Admitting: Physician Assistant

## 2017-09-18 ENCOUNTER — Ambulatory Visit (INDEPENDENT_AMBULATORY_CARE_PROVIDER_SITE_OTHER): Payer: Medicare Other | Admitting: Family

## 2017-09-18 ENCOUNTER — Encounter: Payer: Self-pay | Admitting: Family

## 2017-09-18 VITALS — BP 133/73 | HR 65 | Temp 97.9°F | Ht 68.0 in | Wt 168.0 lb

## 2017-09-18 DIAGNOSIS — B882 Other arthropod infestations: Secondary | ICD-10-CM | POA: Insufficient documentation

## 2017-09-18 NOTE — Progress Notes (Signed)
Subjective:    Patient ID: Betty Gonzalez, female    DOB: 1950-10-18, 67 y.o.   MRN: [id number]  Chief Complaint  Patient presents with  . New Patient (Initial Visit)    lyme disease--pt stated having pain on legs/feet--muscle cramps, dull pain--1 month    HPI:  Betty Gonzalez is a 67 y.o. female who presents today for initial office evaluation and treatment of Lyme Disease.    Mrs. Gonzalez was initially bitten by a tick towards the end of May which she believes was a [place] tick which was likely embedded for less than 24 hours as she found it after being out in the yard and doing gardening. She began to experience the associated symptoms of arthralgias, headache, photophobia, and myalgias about 2 weeks later with no associated rashes or fevers. She was evaluated in her primary care office for tickborne illness with completion of a Lyme disease Western blot test which was positive for IgM. She believes this test was performed approximately 6 weeks after she was bitten. She was prescribed a 21 day course of doxycycline and had approximately 60% improvement in symptoms. She has continued to experience improvement since completion of the doxycycline. Follow-up testing was negative for IgG and IgM criteria. Currently she is experiencing lower extremity cramping at times and continues to have mild photophobia which is significantly improved.   Allergies  Allergen Reactions  . Codeine   . Other     Thermasol - active ingredient in the flu shot -- made eyes swell up  Statins - affects muscles in legs  . Sulfonamide Derivatives   . Tape Rash and Other (See Comments)    Adhesive Tape;  Blisters       Outpatient Medications Prior to Visit  Medication Sig Dispense Refill  . Acetaminophen (TYLENOL) 325 MG CAPS Tylenol    . cetirizine (ZYRTEC) 10 MG tablet Take 10 mg by mouth daily.    . fluconazole (DIFLUCAN) 50 MG tablet Take 50 mg by mouth daily.    . Glucosamine-Chondroit-Vit C-Mn  (GLUCOSAMINE 1500 COMPLEX) CAPS Take by mouth.    Marland Kitchen. LORazepam (ATIVAN) 1 MG tablet Take 1 tablet (1 mg total) by mouth daily as needed for anxiety. No refills until 09/29/2017 90 tablet 0  . Magnesium 250 MG TABS Take by mouth.    . metFORMIN (GLUCOPHAGE XR) 500 MG 24 hr tablet Take 1 tablet (500 mg total) by mouth daily with breakfast. 90 tablet 3   No facility-administered medications prior to visit.      Past Medical History:  Diagnosis Date  . Chronic kidney disease   . Diabetes mellitus without complication (HCC)   . Hyperlipidemia   . Lyme disease       History reviewed. No pertinent surgical history.    Family History  Problem Relation Age of Onset  . Dementia Mother       Social History   Socioeconomic History  . Marital status: Single    Spouse name: Not on file  . Number of children: Not on file  . Years of education: Not on file  . Highest education level: Not on file  Occupational History  . Not on file  Social Needs  . Financial resource strain: Not on file  . Food insecurity:    Worry: Not on file    Inability: Not on file  . Transportation needs:    Medical: Not on file    Non-medical: Not on file  Tobacco Use  .  Smoking status: Current Some Day Smoker    Types: Cigars  . Smokeless tobacco: Never Used  . Tobacco comment: 1 cigar occasion  Substance and Sexual Activity  . Alcohol use: No    Alcohol/week: 0.0 oz  . Drug use: Yes    Types: Marijuana  . Sexual activity: Not Currently  Lifestyle  . Physical activity:    Days per week: Not on file    Minutes per session: Not on file  . Stress: Not on file  Relationships  . Social connections:    Talks on phone: Not on file    Gets together: Not on file    Attends religious service: Not on file    Active member of club or organization: Not on file    Attends meetings of clubs or organizations: Not on file    Relationship status: Not on file  . Intimate partner violence:    Fear of  current or ex partner: Not on file    Emotionally abused: Not on file    Physically abused: Not on file    Forced sexual activity: Not on file  Other Topics Concern  . Not on file  Social History Narrative  . Not on file      Review of Systems  Constitutional: Negative for chills, fatigue, fever and unexpected weight change.  Eyes: Positive for photophobia.  Respiratory: Negative for cough, chest tightness, shortness of breath and wheezing.   Cardiovascular: Negative for chest pain, palpitations and leg swelling.  Gastrointestinal: Negative for constipation, diarrhea, nausea and vomiting.  Musculoskeletal: Positive for arthralgias and myalgias. Negative for neck pain and neck stiffness.  Skin: Negative for rash.  Hematological: Negative for adenopathy.       Objective:    BP 133/73 (BP Location: Left Arm, Patient Position: Sitting, Cuff Size: Normal)   Pulse 65   Temp 97.9 F (36.6 C)   Ht 5\' 8"  (1.727 m)   Wt 168 lb (76.2 kg)   BMI 25.54 kg/m  Nursing note and vital signs reviewed.  Physical Exam  Constitutional: She is oriented to person, place, and time. She appears well-developed and well-nourished. No distress.  Cardiovascular: Normal rate, regular rhythm, normal heart sounds and intact distal pulses. Exam reveals no gallop and no friction rub.  No murmur heard. Pulmonary/Chest: Effort normal and breath sounds normal. No stridor. No respiratory distress. She has no wheezes. She has no rales. She exhibits no tenderness.  Abdominal: Soft. Bowel sounds are normal. She exhibits no distension. There is no tenderness.  Neurological: She is alert and oriented to person, place, and time.  Skin: Skin is warm and dry.  Psychiatric: She has a normal mood and affect. Her behavior is normal. Judgment and thought content normal.        Assessment & Plan:   Problem List Items Addressed This Visit      Other   Tick-borne disease - Primary    [name] may have tick borne  disease that was successfully treated with doxycycline. I am not fully convinced that she truly had Lyme disease based on the timing of the test, the duration of the tick being embedded and the lack of IgG present on the follow up test. [place] ticks are not known for carrying Lyme disease. She did however get significantly better with doxycycline which I think is was the right treatment. She does continue to have improvements. No further antibiotics are necessary and she should continue to see improvements going further. I  am happy to see her back as needed.       Relevant Medications   fluconazole (DIFLUCAN) 50 MG tablet       I am having Lariza B. Carbin maintain her LORazepam, metFORMIN, Magnesium, GLUCOSAMINE 1500 COMPLEX, cetirizine, fluconazole, and Acetaminophen.   Follow-up: Return if symptoms worsen or fail to improve.    Marcos Eke, MSN, FNP-C Nurse Practitioner [facility] for Infectious Disease [facility] Office phone: [phone] Pager: [phone] [facility] Main number: [phone]

## 2017-09-18 NOTE — Assessment & Plan Note (Signed)
Ms. Betty Gonzalez may have tick borne disease that was successfully treated with doxycycline. I am not fully convinced that she truly had Lyme disease based on the timing of the test, the duration of the tick being embedded and the lack of IgG present on the follow up test. [place] ticks are not known for carrying Lyme disease. She did however get significantly better with doxycycline which I think is was the right treatment. She does continue to have improvements. No further antibiotics are necessary and she should continue to see improvements going further. I am happy to see her back as needed.

## 2017-09-18 NOTE — Patient Instructions (Addendum)
Nice to meet you.   There is no additional treatment that is needed.   Your symptoms should continue to resolve as time goes on.

## 2017-09-25 ENCOUNTER — Other Ambulatory Visit: Payer: Self-pay

## 2017-09-25 ENCOUNTER — Ambulatory Visit: Payer: Medicare Other | Admitting: Physician Assistant

## 2017-09-25 ENCOUNTER — Encounter: Payer: Self-pay | Admitting: Physician Assistant

## 2017-09-25 VITALS — BP 110/66 | HR 72 | Temp 97.9°F | Resp 16 | Ht 68.0 in | Wt 167.4 lb

## 2017-09-25 DIAGNOSIS — F410 Panic disorder [episodic paroxysmal anxiety] without agoraphobia: Secondary | ICD-10-CM

## 2017-09-25 DIAGNOSIS — F411 Generalized anxiety disorder: Secondary | ICD-10-CM | POA: Diagnosis not present

## 2017-09-25 DIAGNOSIS — J302 Other seasonal allergic rhinitis: Secondary | ICD-10-CM

## 2017-09-25 DIAGNOSIS — E119 Type 2 diabetes mellitus without complications: Secondary | ICD-10-CM

## 2017-09-25 LAB — HEMOGLOBIN A1C
Est. average glucose Bld gHb Est-mCnc: 148 mg/dL
Hgb A1c MFr Bld: 6.8 % — ABNORMAL HIGH (ref 4.8–5.6)

## 2017-09-25 MED ORDER — METFORMIN HCL ER 500 MG PO TB24: 500.0000 mg | ORAL_TABLET | Freq: Every day | ORAL | 3 refills | Status: DC

## 2017-09-25 MED ORDER — FLUTICASONE PROPIONATE 50 MCG/ACT NA SUSP: 2.0000 | Freq: Every day | NASAL | 6 refills | Status: AC

## 2017-09-25 MED ORDER — LORAZEPAM 1 MG PO TABS: 1.0000 mg | ORAL_TABLET | Freq: Every day | ORAL | 1 refills | Status: AC | PRN

## 2017-09-25 NOTE — Progress Notes (Signed)
09/25/2017 10:23 AM   DOB: 1950-08-17 / MRN: [id number]  SUBJECTIVE:  Betty Gonzalez is a 67 y.o. female presenting for med refills. History of well controlled diabetes. Symptoms present for years and well managed. Likes to dance for exercise.  The problem is stable. She has tried metformin.  She is allergic to codeine; other; sulfonamide derivatives; and tape.   She  has a past medical history of Chronic kidney disease, Diabetes mellitus without complication (HCC), Hyperlipidemia, and Lyme disease.    She  reports that she has been smoking cigars. She has never used smokeless tobacco. She reports that she has current or past drug history. Drug: Marijuana. She reports that she does not drink alcohol. She  reports that she does not currently engage in sexual activity. The patient  has no past surgical history on file.  Her family history includes Dementia in her mother.  Review of Systems  Constitutional: Negative for chills, diaphoresis and fever.  Eyes: Negative.   Respiratory: Negative for cough, hemoptysis, sputum production, shortness of breath and wheezing.   Cardiovascular: Negative for chest pain.  Gastrointestinal: Negative for abdominal pain, blood in stool, constipation, diarrhea, heartburn, melena, nausea and vomiting.  Genitourinary: Negative for dysuria, flank pain, frequency, hematuria and urgency.  Skin: Negative for rash.  Neurological: Negative for dizziness, sensory change, speech change, focal weakness and headaches.    The problem list and medications were reviewed and updated by myself where necessary and exist elsewhere in the encounter.   OBJECTIVE:  BP 110/66   Pulse 72   Temp 97.9 F (36.6 C)   Resp 16   Ht 5\' 8"  (1.727 m)   Wt 167 lb 6.4 oz (75.9 kg)   SpO2 96%   BMI 25.45 kg/m   Wt Readings from Last 3 Encounters:  09/25/17 167 lb 6.4 oz (75.9 kg)  09/18/17 168 lb (76.2 kg)  08/28/17 167 lb 3.2 oz (75.8 kg)   Temp Readings from Last 3  Encounters:  09/25/17 97.9 F (36.6 C)  09/18/17 97.9 F (36.6 C)  08/28/17 98.6 F (37 C) (Oral)   BP Readings from Last 3 Encounters:  09/25/17 110/66  09/18/17 133/73  08/28/17 117/64   Pulse Readings from Last 3 Encounters:  09/25/17 72  09/18/17 65  08/28/17 78    Physical Exam  Constitutional: She is oriented to person, place, and time. She appears well-nourished. No distress.  Eyes: Pupils are equal, round, and reactive to light. EOM are normal.  Cardiovascular: Normal rate, regular rhythm, S1 normal, S2 normal, normal heart sounds and intact distal pulses. Exam reveals no gallop, no friction rub and no decreased pulses.  No murmur heard. Pulmonary/Chest: Effort normal. No stridor. No respiratory distress. She has no wheezes. She has no rales.  Abdominal: She exhibits no distension.  Musculoskeletal: She exhibits no edema.  Neurological: She is alert and oriented to person, place, and time. No cranial nerve deficit. Gait normal.  Skin: Skin is dry. She is not diaphoretic.  Psychiatric: She has a normal mood and affect.  Vitals reviewed.   Lab Results  Component Value Date   HGBA1C 6.8 02/03/2017    Lab Results  Component Value Date   WBC 4.6 08/28/2017   HGB 13.3 08/28/2017   HCT 38.6 08/28/2017   MCV 87 08/28/2017   PLT 260 08/28/2017    Lab Results  Component Value Date   CREATININE 0.92 08/28/2017   BUN 16 08/28/2017   NA 139 08/28/2017  K 4.5 08/28/2017   CL 103 08/28/2017   CO2 25 08/28/2017    Lab Results  Component Value Date   ALT 15 08/28/2017   AST 16 08/28/2017   ALKPHOS 84 08/28/2017   BILITOT 0.3 08/28/2017    Lab Results  Component Value Date   TSH 1.790 06/17/2016    Lab Results  Component Value Date   CHOL 234 (H) 09/19/2016   HDL 45 09/19/2016   LDLCALC 130 (H) 09/19/2016   TRIG 293 (H) 09/19/2016   CHOLHDL 5.2 (H) 09/19/2016     ASSESSMENT AND PLAN:  Lamont was seen today for medication refill.  Diagnoses  and all orders for this visit:  Type 2 diabetes mellitus without complication, without long-term current use of insulin (HCC) -     Hemoglobin A1c -     metFORMIN (GLUCOPHAGE XR) 500 MG 24 hr tablet; Take 1 tablet (500 mg total) by mouth daily with breakfast.  Generalized anxiety disorder with panic attacks -     LORazepam (ATIVAN) 1 MG tablet; Take 1 tablet (1 mg total) by mouth daily as needed for anxiety. No refills until 09/29/2017  Seasonal allergies -     fluticasone (FLONASE) 50 MCG/ACT nasal spray; Place 2 sprays into both nostrils daily.    The patient is advised to call or return to clinic if she does not see an improvement in symptoms, or to seek the care of the closest emergency department if she worsens with the above plan.   Deliah BostonMichael Jex Strausbaugh, MHS, PA-C [facility] 09/25/2017 10:23 AM

## 2017-09-25 NOTE — Patient Instructions (Addendum)
  [facility].    IF you received an x-ray today, you will receive an invoice from [facility]. Please contact [facility] at [phone] with questions or concerns regarding your invoice.   IF you received labwork today, you will receive an invoice from [facility]. Please contact [facility] at [phone] with questions or concerns regarding your invoice.   Our billing staff will not be able to assist you with questions regarding bills from these companies.  You will be contacted with the lab results as soon as they are available. The fastest way to get your results is to activate your My Chart account. Instructions are located on the last page of this paperwork. If you have not heard from us regarding the results in 2 weeks, please contact this office.

## 2017-09-26 ENCOUNTER — Encounter: Payer: Self-pay | Admitting: Radiology

## 2017-09-26 NOTE — Progress Notes (Signed)
She remains a well controlled diabetic. Please send a letter. Deliah BostonMichael Clark, MS, PA-C 9:01 AM, 09/26/2017

## 2017-09-29 ENCOUNTER — Encounter: Payer: Self-pay | Admitting: Physician Assistant

## 2017-10-03 ENCOUNTER — Telehealth: Payer: Self-pay | Admitting: Physician Assistant

## 2017-10-03 ENCOUNTER — Other Ambulatory Visit: Payer: Self-pay | Admitting: Physician Assistant

## 2017-10-03 MED ORDER — INDOMETHACIN 25 MG PO CAPS: 25.0000 mg | ORAL_CAPSULE | Freq: Two times a day (BID) | ORAL | 1 refills | Status: DC | PRN

## 2017-10-03 NOTE — Telephone Encounter (Signed)
Copied from CRM [phone]#[id number]. Topic: Quick Communication - See Telephone Encounter >> Oct 03, 2017 12:25 PM Burchel, Abbi R wrote: CRM for notification. See Telephone encounter for: 10/03/17.  Pt called to f/up on request for indomethacin per MyChart Msg on 10/01/17. Pt states pharmacy says they did not recieve it.    Please send to: [facility] [phone] - [place], [place] - [address] AT [facility] [address] & [id number] [place] [id number] Phone: [phone] Fax: [phone]

## 2017-10-03 NOTE — Telephone Encounter (Signed)
Please send my apologies.  I had meant to get this is for her.  It has been sent now. Deliah BostonMichael Zamani Crocker, MS, PA-C 4:38 PM, 10/03/2017

## 2017-10-17 ENCOUNTER — Other Ambulatory Visit: Payer: Self-pay

## 2017-10-17 ENCOUNTER — Ambulatory Visit: Payer: Medicare Other | Admitting: Physician Assistant

## 2017-10-17 ENCOUNTER — Encounter: Payer: Self-pay | Admitting: Physician Assistant

## 2017-10-17 VITALS — BP 120/76 | HR 80 | Temp 98.6°F | Resp 16 | Ht 67.0 in | Wt 167.2 lb

## 2017-10-17 DIAGNOSIS — N760 Acute vaginitis: Secondary | ICD-10-CM | POA: Diagnosis not present

## 2017-10-17 DIAGNOSIS — B9689 Other specified bacterial agents as the cause of diseases classified elsewhere: Secondary | ICD-10-CM

## 2017-10-17 DIAGNOSIS — R350 Frequency of micturition: Secondary | ICD-10-CM

## 2017-10-17 LAB — POCT URINALYSIS DIP (MANUAL ENTRY)
Bilirubin, UA: NEGATIVE
Blood, UA: NEGATIVE
Glucose, UA: NEGATIVE mg/dL
Ketones, POC UA: NEGATIVE mg/dL
Leukocytes, UA: NEGATIVE
Nitrite, UA: NEGATIVE
Protein Ur, POC: NEGATIVE mg/dL
Spec Grav, UA: 1.02 (ref 1.010–1.025)
Urobilinogen, UA: 0.2 U/dL
pH, UA: 7 (ref 5.0–8.0)

## 2017-10-17 LAB — POCT WET + KOH PREP
Trich by wet prep: ABSENT
Yeast by KOH: ABSENT
Yeast by wet prep: ABSENT

## 2017-10-17 LAB — POC MICROSCOPIC URINALYSIS (UMFC): Mucus: ABSENT

## 2017-10-17 MED ORDER — METRONIDAZOLE 0.75 % VA GEL: 1.0000 | Freq: Every day | VAGINAL | 0 refills | Status: AC

## 2017-10-17 MED ORDER — METRONIDAZOLE 500 MG PO TABS: 500.0000 mg | ORAL_TABLET | Freq: Two times a day (BID) | ORAL | 0 refills | Status: DC

## 2017-10-17 NOTE — Patient Instructions (Addendum)
I am treating you today for bacterial vaginitis.  Apply Metrogel vaginally at night before bed for the next 5 days.  DO NOT DRINK ALCOHOL WHILE TAKING THIS. Alcohol should not be consumed during therapy and for one day after completion of therapy.  Come back if you are not improving in 7-10 days.    Bacterial Vaginosis Bacterial vaginosis is a vaginal infection that occurs when the normal balance of bacteria in the vagina is disrupted. It results from an overgrowth of certain bacteria. This is the most common vaginal infection among women ages 9-44. Because bacterial vaginosis increases your risk for STIs (sexually transmitted infections), getting treated can help reduce your risk for chlamydia, gonorrhea, herpes, and HIV (human immunodeficiency virus). Treatment is also important for preventing complications in pregnant women, because this condition can cause an early (premature) delivery. What are the causes? This condition is caused by an increase in harmful bacteria that are normally present in small amounts in the vagina. However, the reason that the condition develops is not fully understood. What increases the risk? The following factors may make you more likely to develop this condition:  Having a new sexual partner or multiple sexual partners.  Having unprotected sex.  Douching.  Having an intrauterine device (IUD).  Smoking.  Drug and alcohol abuse.  Taking certain antibiotic medicines.  Being pregnant.  You cannot get bacterial vaginosis from toilet seats, bedding, swimming pools, or contact with objects around you. What are the signs or symptoms? Symptoms of this condition include:  Grey or white vaginal discharge. The discharge can also be watery or foamy.  A fish-like odor with discharge, especially after sexual intercourse or during menstruation.  Itching in and around the vagina.  Burning or pain with urination.  Some women with bacterial vaginosis have no  signs or symptoms. How is this diagnosed? This condition is diagnosed based on:  Your medical history.  A physical exam of the vagina.  Testing a sample of vaginal fluid under a microscope to look for a large amount of bad bacteria or abnormal cells. Your health care provider may use a cotton swab or a small wooden spatula to collect the sample.  How is this treated? This condition is treated with antibiotics. These may be given as a pill, a vaginal cream, or a medicine that is put into the vagina (suppository). If the condition comes back after treatment, a second round of antibiotics may be needed. Follow these instructions at home: Medicines  Take over-the-counter and prescription medicines only as told by your health care provider.  Take or use your antibiotic as told by your health care provider. Do not stop taking or using the antibiotic even if you start to feel better. General instructions  If you have a female sexual partner, tell her that you have a vaginal infection. She should see her health care provider and be treated if she has symptoms. If you have a female sexual partner, he does not need treatment.  During treatment: ? Avoid sexual activity until you finish treatment. ? Do not douche. ? Avoid alcohol as directed by your health care provider. ? Avoid breastfeeding as directed by your health care provider.  Drink enough water and fluids to keep your urine clear or pale yellow.  Keep the area around your vagina and rectum clean. ? Wash the area daily with warm water. ? Wipe yourself from front to back after using the toilet.  Keep all follow-up visits as told by your health  care provider. This is important. How is this prevented?  Do not douche.  Wash the outside of your vagina with warm water only.  Use protection when having sex. This includes latex condoms and dental dams.  Limit how many sexual partners you have. To help prevent bacterial vaginosis, it is  best to have sex with just one partner (monogamous).  Make sure you and your sexual partner are tested for STIs.  Wear cotton or cotton-lined underwear.  Avoid wearing tight pants and pantyhose, especially during summer.  Limit the amount of alcohol that you drink.  Do not use any products that contain nicotine or tobacco, such as cigarettes and e-cigarettes. If you need help quitting, ask your health care provider.  Do not use illegal drugs. Where to find more information:  Centers for Disease Control and Prevention: AppraiserFraud.fi  American Sexual Health Association (ASHA): www.ashastd.org  U.S. Department of Health and Financial controller, Office on Women's Health: DustingSprays.pl or SecuritiesCard.it Contact a health care provider if:  Your symptoms do not improve, even after treatment.  You have more discharge or pain when urinating.  You have a fever.  You have pain in your abdomen.  You have pain during sex.  You have vaginal bleeding between periods. Summary  Bacterial vaginosis is a vaginal infection that occurs when the normal balance of bacteria in the vagina is disrupted.  Because bacterial vaginosis increases your risk for STIs (sexually transmitted infections), getting treated can help reduce your risk for chlamydia, gonorrhea, herpes, and HIV (human immunodeficiency virus). Treatment is also important for preventing complications in pregnant women, because the condition can cause an early (premature) delivery.  This condition is treated with antibiotic medicines. These may be given as a pill, a vaginal cream, or a medicine that is put into the vagina (suppository). This information is not intended to replace advice given to you by your health care provider. Make sure you discuss any questions you have with your health care provider. Document Released: 01/31/2005 Document Revised: 06/06/2016 Document Reviewed:  10/17/2015 Elsevier Interactive Patient Education  [employer].   Thank you for coming in today. I hope you feel we met your needs.  Feel free to call PCP if you have any questions or further requests.  Please consider signing up for MyChart if you do not already have it, as this is a great way to communicate with me.  Best,  Whitney McVey, PA-C  IF you received an x-ray today, you will receive an invoice from [facility]. Please contact [facility] at [phone] with questions or concerns regarding your invoice.   IF you received labwork today, you will receive an invoice from [facility]. Please contact [facility] at [phone] with questions or concerns regarding your invoice.   Our billing staff will not be able to assist you with questions regarding bills from these companies.  You will be contacted with the lab results as soon as they are available. The fastest way to get your results is to activate your My Chart account. Instructions are located on the last page of this paperwork. If you have not heard from [place] regarding the results in 2 weeks, please contact this office.

## 2017-10-17 NOTE — Progress Notes (Signed)
Betty Gonzalez  MRN: [id number] DOB: [birth year]  PCP: Silvestre Mesi  Subjective:  Pt is a 67 year old female who presents to clinic for urinary symptoms and abdominal pain x 1 days.  Endorses increased urinary frequency last night.  Last UTI > 5 years ago. Used to have them frequently. Sees urologist for UTIs - last appt about 2 years ago. H/o kidney stones.  She stays well hydrated.  She does has h/o yeast infections.  Denies vaginal discharge, flank pain, fever, chills, n/v. Not concerned for STD testing today.   Review of Systems  Constitutional: Negative for chills, fatigue and fever.  Respiratory: Negative for cough, shortness of breath and wheezing.   Cardiovascular: Negative for chest pain and palpitations.  Gastrointestinal: Positive for abdominal pain. Negative for diarrhea, nausea and vomiting.  Genitourinary: Positive for frequency. Negative for decreased urine volume, difficulty urinating, dysuria, enuresis, flank pain, hematuria and urgency.  Musculoskeletal: Negative for back pain.  Neurological: Negative for dizziness, weakness, light-headedness and headaches.    Patient Active Problem List   Diagnosis Date Noted  . 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 Statin intolerance 02/03/2017  . Well controlled diabetes mellitus (HCC) 02/01/2016    Current Outpatient Medications on File Prior to Visit  Medication Sig Dispense Refill  . fluticasone (FLONASE) 50 MCG/ACT nasal spray Place 2 sprays into both nostrils daily. 16 g 6  . Glucosamine 500 MG CAPS Take by mouth.    . indomethacin (INDOCIN) 25 MG capsule Take 1 capsule (25 mg total) by mouth 2 (two) times daily as needed for moderate pain. 60 capsule 1  . LORazepam (ATIVAN) 1 MG tablet Take 1 tablet (1 mg total) by mouth daily as needed for anxiety. No refills until 09/29/2017 90 tablet 1  . Magnesium 250 MG TABS Take by mouth.    . metFORMIN (GLUCOPHAGE XR) 500 MG 24 hr tablet Take 1 tablet (500 mg total) by  mouth daily with breakfast. 90 tablet 3   No current facility-administered medications on file prior to visit.     Allergies  Allergen Reactions  . Codeine   . Other     Thermasol - active ingredient in the flu shot -- made eyes swell up  Statins - affects muscles in legs  . Sulfonamide Derivatives   . Tape Rash and Other (See Comments)    Adhesive Tape;  Blisters      Objective:  BP 120/76 (BP Location: Left Arm, Patient Position: Sitting, Cuff Size: Normal)   Pulse 80   Temp 98.6 F (37 C) (Oral)   Resp 16   Ht 5\' 7"  (1.702 m)   Wt 167 lb 3.2 oz (75.8 kg)   SpO2 99%   BMI 26.19 kg/m   Physical Exam  Constitutional: She is oriented to person, place, and time. No distress.  Abdominal: Soft. Normal appearance. There is no CVA tenderness.  Genitourinary: Vagina normal. Cervix exhibits no motion tenderness and no discharge. No vaginal discharge found.  Neurological: She is alert and oriented to person, place, and time.  Skin: Skin is warm and dry.  Psychiatric: Judgment normal.  Vitals reviewed.  Results for orders placed or performed in visit on 10/17/17  POCT urinalysis dipstick  Result Value Ref Range   Color, UA yellow yellow   Clarity, UA clear clear   Glucose, UA negative negative mg/dL   Bilirubin, UA negative negative   Ketones, POC UA negative negative mg/dL   Spec Grav, UA 5.284 1.324 -  1.025   Blood, UA negative negative   pH, UA 7.0 5.0 - 8.0   Protein Ur, POC negative negative mg/dL   Urobilinogen, UA 0.2 0.2 or 1.0 E.U./dL   Nitrite, UA Negative Negative   Leukocytes, UA Negative Negative  POCT Microscopic Urinalysis ([facility])  Result Value Ref Range   WBC,UR,HPF,POC None None WBC/hpf   RBC,UR,HPF,POC None None RBC/hpf   Bacteria None None, Too numerous to count   Mucus Absent Absent   Epithelial Cells, UR Per Microscopy Few (A) None, Too numerous to count cells/hpf  POCT Wet + KOH Prep  Result Value Ref Range   Yeast by KOH Absent Absent   Yeast  by wet prep Absent Absent   WBC by wet prep Few Few   Clue Cells Wet Prep HPF POC Few (A) None   Trich by wet prep Absent Absent   Bacteria Wet Prep HPF POC Few Few   Epithelial Cells By [employer] Pref ([facility]) Many (A) None, Few, Too numerous to count   RBC,UR,HPF,POC None None RBC/hpf    Assessment and Plan :  1. BV (bacterial vaginosis) - pt presents c/o increased urinary frequency. UA is negative. No red flags. PE is unremarkable. Wet prep +few clue cells. Will treat with Metrogel vaginal supp. RTC in one week if no improvement.   - metroNIDAZOLE (METROGEL VAGINAL) 0.75 % vaginal gel; Place 1 Applicatorful vaginally at bedtime for 5 days.  Dispense: 70 g; Refill: 0  2. Urinary frequency - Negative UA. Culture is pending.  - POCT urinalysis dipstick - POCT Microscopic Urinalysis ([facility]) - Urine Culture - POCT Wet + KOH Prep   Marco Collie, PA-C  [facility] 10/17/2017 10:46 AM  Please note: Portions of this report may have been transcribed using dragon voice recognition software. Every effort was made to ensure accuracy; however, inadvertent computerized transcription errors may be present.

## 2017-10-18 LAB — URINE CULTURE

## 2017-12-13 DIAGNOSIS — Z87442 Personal history of urinary calculi: Secondary | ICD-10-CM | POA: Insufficient documentation

## 2018-01-14 ENCOUNTER — Other Ambulatory Visit: Payer: Self-pay | Admitting: Physician Assistant

## 2018-01-16 ENCOUNTER — Other Ambulatory Visit: Payer: Self-pay | Admitting: Physician Assistant

## 2018-04-24 ENCOUNTER — Other Ambulatory Visit: Payer: Self-pay | Admitting: Physician Assistant

## 2018-04-24 DIAGNOSIS — Z299 Encounter for prophylactic measures, unspecified: Secondary | ICD-10-CM

## 2018-06-13 ENCOUNTER — Other Ambulatory Visit: Payer: Self-pay | Admitting: Physician Assistant

## 2018-06-13 NOTE — Telephone Encounter (Signed)
Please review

## 2018-06-14 ENCOUNTER — Other Ambulatory Visit: Payer: Self-pay | Admitting: Family Medicine

## 2018-10-01 ENCOUNTER — Other Ambulatory Visit: Payer: Self-pay | Admitting: Family Medicine

## 2018-10-01 ENCOUNTER — Other Ambulatory Visit: Payer: Self-pay | Admitting: Physician Assistant

## 2018-10-01 DIAGNOSIS — E119 Type 2 diabetes mellitus without complications: Secondary | ICD-10-CM

## 2018-10-01 NOTE — Telephone Encounter (Signed)
Refilled metformin for 30 days, 0 refills. Patient needs to establish care with new provider for additional refills.

## 2018-10-18 NOTE — Telephone Encounter (Signed)
LVM to schedule appt to [facility] and med refills

## 2019-04-05 ENCOUNTER — Ambulatory Visit
Admission: EM | Admit: 2019-04-05 | Discharge: 2019-04-05 | Disposition: A | Discharge status: Home / Self Care | Payer: Medicare Other | Attending: Emergency Medicine | Admitting: Emergency Medicine

## 2019-04-05 ENCOUNTER — Other Ambulatory Visit: Payer: Self-pay

## 2019-04-05 DIAGNOSIS — N898 Other specified noninflammatory disorders of vagina: Secondary | ICD-10-CM | POA: Diagnosis present

## 2019-04-05 DIAGNOSIS — R350 Frequency of micturition: Secondary | ICD-10-CM

## 2019-04-05 LAB — POCT URINALYSIS DIP (MANUAL ENTRY)
Bilirubin, UA: NEGATIVE
Blood, UA: NEGATIVE
Glucose, UA: NEGATIVE mg/dL
Ketones, POC UA: NEGATIVE mg/dL
Leukocytes, UA: NEGATIVE
Nitrite, UA: NEGATIVE
Protein Ur, POC: NEGATIVE mg/dL
Spec Grav, UA: 1.02 (ref 1.010–1.025)
Urobilinogen, UA: 0.2 E.U./dL
pH, UA: 7 (ref 5.0–8.0)

## 2019-04-05 MED ORDER — METRONIDAZOLE 500 MG PO TABS: 500.0000 mg | ORAL_TABLET | Freq: Two times a day (BID) | ORAL | 0 refills | Status: DC

## 2019-04-05 NOTE — Discharge Instructions (Signed)
Urine did not show signs of infection Vaginal swab obtained.  Someone will notify you of abnormal results Based on symptoms will treat for possible bacterial vaginosis Metronidazole prescribed.  Take as directed and to completion Push fluids and get plenty of rest.   Follow up with PCP if symptoms persists Return here or go to ER if you have any new or worsening symptoms such as fever, worsening abdominal pain, nausea/vomiting, flank pain, etc..Marland Kitchen

## 2019-04-05 NOTE — ED Triage Notes (Signed)
Pt presents to UC w/ urinary frequency x1 day. Denies pain or burning. Hx kidney stones and infections.

## 2019-04-05 NOTE — ED Provider Notes (Addendum)
[facility]   CC: Urinary frequency  SUBJECTIVE:  Betty Gonzalez is a 69 y.o. female who complains of urinary frequency, urgency and dysuria as well as clear vaginal discharge x 1 day.  Patient denies a precipitating event, recent sexual encounter, excessive caffeine intake.  Denies abdominal or flank pain.  Has NOT tried OTC medications.  Symptoms are made worse with urination.  Admits to similar symptoms in the past with UTI, BV and kidney stone.  Denies fever, chills, nausea, vomiting, abdominal pain, flank pain, vaginal itching, vaginal odor, hematuria.    LMP: No LMP recorded. Patient is postmenopausal.  ROS: As in HPI.  All other pertinent ROS negative.     Past Medical History:  Diagnosis Date  . Chronic kidney disease   . Diabetes mellitus without complication (HCC)   . Hyperlipidemia   . Lyme disease    History reviewed. No pertinent surgical history. Allergies  Allergen Reactions  . Codeine   . Other     Thermasol - active ingredient in the flu shot -- made eyes swell up  Statins - affects muscles in legs  . Sulfonamide Derivatives   . Tape Rash and Other (See Comments)    Adhesive Tape;  Blisters    No current facility-administered medications on file prior to encounter.   Current Outpatient Medications on File Prior to Encounter  Medication Sig Dispense Refill  . fluticasone (FLONASE) 50 MCG/ACT nasal spray Place 2 sprays into both nostrils daily. 16 g 6  . Glucosamine 500 MG CAPS Take by mouth.    . indomethacin (INDOCIN) 25 MG capsule TAKE 1 CAPSULE(25 MG) BY MOUTH TWICE DAILY AS NEEDED FOR MODERATE PAIN 60 capsule 0  . LORazepam (ATIVAN) 1 MG tablet Take 1 tablet (1 mg total) by mouth daily as needed for anxiety. No refills until 09/29/2017 90 tablet 1  . Magnesium 250 MG TABS Take by mouth.    . metFORMIN (GLUCOPHAGE-XR) 500 MG 24 hr tablet TAKE 1 TABLET(500 MG) BY MOUTH DAILY WITH BREAKFAST 30 tablet 0   Social History   Socioeconomic History    . Marital status: Single    Spouse name: Not on file  . Number of children: Not on file  . Years of education: Not on file  . Highest education level: Not on file  Occupational History  . Not on file  Tobacco Use  . Smoking status: Current Some Day Smoker    Types: Cigars  . Smokeless tobacco: Never Used  . Tobacco comment: 1 cigar occasion  Substance and Sexual Activity  . Alcohol use: No    Alcohol/week: 0.0 standard drinks  . Drug use: Yes    Types: Marijuana  . Sexual activity: Not Currently  Other Topics Concern  . Not on file  Social History Narrative  . Not on file   Social Determinants of Health   Financial Resource Strain:   . Difficulty of Paying Living Expenses: Not on file  Food Insecurity:   . Worried About Programme researcher, broadcasting/film/video in the Last Year: Not on file  . Ran Out of Food in the Last Year: Not on file  Transportation Needs:   . Lack of Transportation (Medical): Not on file  . Lack of Transportation (Non-Medical): Not on file  Physical Activity:   . Days of Exercise per Week: Not on file  . Minutes of Exercise per Session: Not on file  Stress:   . Feeling of Stress : Not on file  Social  Connections:   . Frequency of Communication with Friends and Family: Not on file  . Frequency of Social Gatherings with Friends and Family: Not on file  . Attends Religious Services: Not on file  . Active Member of Clubs or Organizations: Not on file  . Attends Archivist Meetings: Not on file  . Marital Status: Not on file  Intimate Partner Violence:   . Fear of Current or Ex-Partner: Not on file  . Emotionally Abused: Not on file  . Physically Abused: Not on file  . Sexually Abused: Not on file   Family History  Problem Relation Age of Onset  . Dementia Mother   . Healthy Father     OBJECTIVE:  Vitals:   04/05/19 1429  BP: (!) 150/80  Pulse: 81  Resp: 17  Temp: 97.6 F (36.4 C)  TempSrc: Oral  SpO2: 96%   General appearance: Alert in no  acute distress HEENT: NCAT.  Oropharynx clear.  Lungs: clear to auscultation bilaterally without adventitious breath sounds Heart: soft systolic murmur Abdomen: soft; non-distended; no tenderness; bowel sounds present; no guarding Back: no CVA tenderness Extremities: no edema; symmetrical with no gross deformities Skin: warm and dry Neurologic: Ambulates from chair to exam table without difficulty Psychological: alert and cooperative; normal mood and affect  Labs Reviewed  POCT URINALYSIS DIP (MANUAL ENTRY)  CERVICOVAGINAL ANCILLARY ONLY   LABS:  Results for orders placed or performed during the hospital encounter of 04/05/19 (from the past 24 hour(s))  POCT urinalysis dipstick     Status: None   Collection Time: 04/05/19  2:38 PM  Result Value Ref Range   Color, UA yellow yellow   Clarity, UA clear clear   Glucose, UA negative negative mg/dL   Bilirubin, UA negative negative   Ketones, POC UA negative negative mg/dL   Spec Grav, UA 1.020 1.010 - 1.025   Blood, UA negative negative   pH, UA 7.0 5.0 - 8.0   Protein Ur, POC negative negative mg/dL   Urobilinogen, UA 0.2 0.2 or 1.0 E.U./dL   Nitrite, UA Negative Negative   Leukocytes, UA Negative Negative    ASSESSMENT & PLAN:  1. Urinary frequency   2. Vaginal discharge     Meds ordered this encounter  Medications  . metroNIDAZOLE (FLAGYL) 500 MG tablet    Sig: Take 1 tablet (500 mg total) by mouth 2 (two) times daily.    Dispense:  14 tablet    Refill:  0    Order Specific Question:   Supervising Provider    Answer:   Raylene Everts [[id number]]   Urine did not show signs of infection Vaginal swab obtained.  Someone will notify you of abnormal results Based on symptoms will treat for possible bacterial vaginosis Metronidazole prescribed.  Take as directed and to completion Push fluids and get plenty of rest.   Follow up with PCP if symptoms persists Return here or go to ER if you have any new or worsening  symptoms such as fever, worsening abdominal pain, nausea/vomiting, flank pain, etc...  Outlined signs and symptoms indicating need for more acute intervention. Patient verbalized understanding. After Visit Summary given.     Lestine Box, PA-C 04/05/19 [place], [place], PA-C 04/05/19 1658

## 2019-04-09 LAB — CERVICOVAGINAL ANCILLARY ONLY
Bacterial vaginitis: NEGATIVE
Candida vaginitis: NEGATIVE
Chlamydia: NEGATIVE
Neisseria Gonorrhea: NEGATIVE
Trichomonas: NEGATIVE

## 2019-04-15 ENCOUNTER — Other Ambulatory Visit: Payer: Self-pay | Admitting: Family Medicine

## 2019-04-15 DIAGNOSIS — E119 Type 2 diabetes mellitus without complications: Secondary | ICD-10-CM

## 2019-04-15 NOTE — Telephone Encounter (Signed)
Requested medication (s) are due for refill today: yes  Requested medication (s) are on the active medication list: yes  Last refill:  10/01/2018  Future visit scheduled:No  Notes to clinic:  Last refill states patient needs appointment for refills.    Requested Prescriptions  Pending Prescriptions Disp Refills   metFORMIN (GLUCOPHAGE-XR) 500 MG 24 hr tablet [Pharmacy Med Name: METFORMIN ER 500MG 24HR TABS] 30 tablet 0    Sig: TAKE 1 TABLET(500 MG) BY MOUTH DAILY WITH BREAKFAST      Endocrinology:  Diabetes - Biguanides Failed - 04/15/2019  1:51 PM      Failed - Cr in normal range and within 360 days    Creat  Date Value Ref Range Status  08/22/2015 0.97 0.50 - 0.99 mg/dL Final    Comment:      For patients > or = 71 years of age: The upper reference limit for Creatinine is approximately 13% higher for people identified as African-American.      Creatinine, Ser  Date Value Ref Range Status  08/28/2017 0.92 0.57 - 1.00 mg/dL Final          Failed - HBA1C is between 0 and 7.9 and within 180 days    Hgb A1c MFr Bld  Date Value Ref Range Status  09/25/2017 6.8 (H) 4.8 - 5.6 % Final    Comment:             Prediabetes: 5.7 - 6.4          Diabetes: >6.4          Glycemic control for adults with diabetes: <7.0           Failed - eGFR in normal range and within 360 days    GFR, Est African American  Date Value Ref Range Status  08/12/2014 85 mL/min Final   GFR calc Af Amer  Date Value Ref Range Status  08/28/2017 73 >59 mL/min/1.73 Final   GFR, Est Non African American  Date Value Ref Range Status  08/12/2014 74 mL/min Final    Comment:      The estimated GFR is a calculation valid for adults (>=26 years old) that uses the CKD-EPI algorithm to adjust for age and sex. It is   not to be used for children, pregnant women, hospitalized patients,    patients on dialysis, or with rapidly changing kidney function. According to the NKDEP, eGFR >89 is normal, 60-89  shows mild impairment, 30-59 shows moderate impairment, 15-29 shows severe impairment and <15 is ESRD.      GFR calc non Af Amer  Date Value Ref Range Status  08/28/2017 64 >59 mL/min/1.73 Final          Failed - Valid encounter within last 6 months    Recent Outpatient Visits           1 year ago BV (bacterial vaginosis)   Primary Care at [facility], Gelene Mink, PA-C   1 year ago Type 2 diabetes mellitus without complication, without long-term current use of insulin [facility])   [facility] at [facility], Audrie Lia, PA-C   1 year ago Acute Lyme disease   Primary Care at [place], PA-C   1 year ago Other fatigue   Primary Care at [place], PA-C   1 year ago Chronic neck pain   Primary Care at [place], PA-C

## 2019-04-16 NOTE — Telephone Encounter (Signed)
Pt notified via MyChart as well to call office for appt

## 2019-04-16 NOTE — Telephone Encounter (Signed)
Patient need a office visit .

## 2019-04-16 NOTE — Telephone Encounter (Signed)
Called Patient and left a voicemail for her to call back and schedule her a Office Visit.

## 2019-08-09 ENCOUNTER — Ambulatory Visit
Admission: EM | Admit: 2019-08-09 | Discharge: 2019-08-09 | Disposition: A | Discharge status: Home / Self Care | Payer: Medicare Other | Attending: Emergency Medicine | Admitting: Emergency Medicine

## 2019-08-09 ENCOUNTER — Other Ambulatory Visit: Payer: Self-pay

## 2019-08-09 DIAGNOSIS — B029 Zoster without complications: Secondary | ICD-10-CM

## 2019-08-09 DIAGNOSIS — L299 Pruritus, unspecified: Secondary | ICD-10-CM

## 2019-08-09 DIAGNOSIS — R21 Rash and other nonspecific skin eruption: Secondary | ICD-10-CM

## 2019-08-09 MED ORDER — VALACYCLOVIR HCL 1 G PO TABS: 1000.0000 mg | ORAL_TABLET | Freq: Two times a day (BID) | ORAL | 0 refills | Status: AC

## 2019-08-09 MED ORDER — PREDNISONE 20 MG PO TABS: 20.0000 mg | ORAL_TABLET | Freq: Every day | ORAL | 0 refills | Status: AC

## 2019-08-09 NOTE — ED Triage Notes (Signed)
Pt has rash  On left neck and chest that came up this week

## 2019-08-09 NOTE — Discharge Instructions (Signed)
Rest and use ice/heat as needed for symptomatic relief Prescribed valacyclovir 1000 mg 2x/day for 10 days Prescribed prednisone for inflammation and pain Use OTC medications such as ibuprofen/ tylenol.   Follow up with PCP in 7-10 days if rash is still present Follow up with PCP if symptoms of burning, stinging, tingling or numbness occur after rash resolves, you may need additional treatment Return here or go to ER if you have any new or worsening symptoms (such as eye involvement, severe pain, or signs of secondary infection such as fever, chills, nausea, vomiting, discharge, redness or warmth over site of rash)

## 2019-08-09 NOTE — ED Provider Notes (Signed)
[facility]   [id number] 08/09/19 Arrival Time: 1221  CC: Rash   SUBJECTIVE:  Betty Gonzalez is a 69 y.o. female who presents with a rash x 4-5 days ago.  Denies precipitating event or trauma.  Denies changes in soaps, detergents, close contacts with similar rash, environmental trigger, allergy. Denies medications change or starting a new medication recently.  Localizes the rash to LT side of neck and front of chest.  Describes it as itchy.  Has tried OTC cream/ lotions without relief.  Symptoms are made worse with scratching.  Denies similar symptoms in the past.   Denies fever, chills, nausea, vomiting, swelling, discharge, oral lesions, genital lesions, SOB, chest pain, abdominal pain, changes in bowel or bladder function.    ROS: As per HPI.  All other pertinent ROS negative.     Past Medical History:  Diagnosis Date  . Chronic kidney disease   . Diabetes mellitus without complication (HCC)   . Hyperlipidemia   . Lyme disease    No past surgical history on file. Allergies  Allergen Reactions  . Codeine   . Other     Thermasol - active ingredient in the flu shot -- made eyes swell up  Statins - affects muscles in legs  . Sulfonamide Derivatives   . Tape Rash and Other (See Comments)    Adhesive Tape;  Blisters    No current facility-administered medications on file prior to encounter.   Current Outpatient Medications on File Prior to Encounter  Medication Sig Dispense Refill  . fluticasone (FLONASE) 50 MCG/ACT nasal spray Place 2 sprays into both nostrils daily. 16 g 6  . Glucosamine 500 MG CAPS Take by mouth.    . indomethacin (INDOCIN) 25 MG capsule TAKE 1 CAPSULE(25 MG) BY MOUTH TWICE DAILY AS NEEDED FOR MODERATE PAIN 60 capsule 0  . LORazepam (ATIVAN) 1 MG tablet Take 1 tablet (1 mg total) by mouth daily as needed for anxiety. No refills until 09/29/2017 90 tablet 1  . Magnesium 250 MG TABS Take by mouth.    . metFORMIN (GLUCOPHAGE-XR) 500 MG 24 hr tablet  TAKE 1 TABLET(500 MG) BY MOUTH DAILY WITH BREAKFAST 30 tablet 0  . metroNIDAZOLE (FLAGYL) 500 MG tablet Take 1 tablet (500 mg total) by mouth 2 (two) times daily. 14 tablet 0   Social History   Socioeconomic History  . Marital status: Single    Spouse name: Not on file  . Number of children: Not on file  . Years of education: Not on file  . Highest education level: Not on file  Occupational History  . Not on file  Tobacco Use  . Smoking status: Current Some Day Smoker    Types: Cigars  . Smokeless tobacco: Never Used  . Tobacco comment: 1 cigar occasion  Substance and Sexual Activity  . Alcohol use: No    Alcohol/week: 0.0 standard drinks  . Drug use: Yes    Types: Marijuana  . Sexual activity: Not Currently  Other Topics Concern  . Not on file  Social History Narrative  . Not on file   Social Determinants of Health   Financial Resource Strain:   . Difficulty of Paying Living Expenses:   Food Insecurity:   . Worried About Programme researcher, broadcasting/film/video in the Last Year:   . Barista in the Last Year:   Transportation Needs:   . Freight forwarder (Medical):   Marland Kitchen Lack of Transportation (Non-Medical):   Physical Activity:   .  Days of Exercise per Week:   . Minutes of Exercise per Session:   Stress:   . Feeling of Stress :   Social Connections:   . Frequency of Communication with Friends and Family:   . Frequency of Social Gatherings with Friends and Family:   . Attends Religious Services:   . Active Member of Clubs or Organizations:   . Attends Archivist Meetings:   Marland Kitchen Marital Status:   Intimate Partner Violence:   . Fear of Current or Ex-Partner:   . Emotionally Abused:   Marland Kitchen Physically Abused:   . Sexually Abused:    Family History  Problem Relation Age of Onset  . Dementia Mother   . Healthy Father     OBJECTIVE: Vitals:   08/09/19 1242  BP: 130/77  Pulse: 80  Resp: 20  Temp: 98 F (36.7 C)  SpO2: 95%    General appearance: alert; no  distress Head: NCAT Lungs: normal respiratory effort Extremities: no edema Skin: warm and dry; Crops of red/purplish papules over LT side of neck and anterior chest Psychological: alert and cooperative; normal mood and affect  ASSESSMENT & PLAN:  1. Herpes zoster without complication   2. Rash and nonspecific skin eruption   3. Itching     Meds ordered this encounter  Medications  . predniSONE (DELTASONE) 20 MG tablet    Sig: Take 1 tablet (20 mg total) by mouth daily with breakfast for 5 days.    Dispense:  5 tablet    Refill:  0    Order Specific Question:   Supervising Provider    Answer:   Raylene Everts [[id number]]  . valACYclovir (VALTREX) 1000 MG tablet    Sig: Take 1 tablet (1,000 mg total) by mouth 2 (two) times daily for 14 days.    Dispense:  28 tablet    Refill:  0    Order Specific Question:   Supervising Provider    Answer:   Raylene Everts [[id number]]   Rest and use ice/heat as needed for symptomatic relief Prescribed valacyclovir 1000 mg 2x/day for 10 days Prescribed prednisone for inflammation and pain Use OTC medications such as ibuprofen/ tylenol.   Follow up with PCP in 7-10 days if rash is still present Follow up with PCP if symptoms of burning, stinging, tingling or numbness occur after rash resolves, you may need additional treatment Return here or go to ER if you have any new or worsening symptoms (such as eye involvement, severe pain, or signs of secondary infection such as fever, chills, nausea, vomiting, discharge, redness or warmth over site of rash)   Reviewed expectations re: course of current medical issues. Questions answered. Outlined signs and symptoms indicating need for more acute intervention. Patient verbalized understanding. After Visit Summary given.   Lestine Box, PA-C 08/09/19 1255

## 2019-08-15 ENCOUNTER — Encounter: Payer: Self-pay | Admitting: Family Medicine

## 2019-08-15 ENCOUNTER — Ambulatory Visit (INDEPENDENT_AMBULATORY_CARE_PROVIDER_SITE_OTHER): Payer: Medicare Other | Admitting: Family Medicine

## 2019-08-15 ENCOUNTER — Other Ambulatory Visit: Payer: Self-pay

## 2019-08-15 VITALS — BP 127/72 | HR 79 | Temp 98.1°F | Ht 67.0 in | Wt 168.0 lb

## 2019-08-15 DIAGNOSIS — L255 Unspecified contact dermatitis due to plants, except food: Secondary | ICD-10-CM

## 2019-08-15 MED ORDER — TRIAMCINOLONE ACETONIDE 0.1 % EX CREA: 1.0000 "application " | TOPICAL_CREAM | Freq: Three times a day (TID) | CUTANEOUS | 1 refills | Status: DC

## 2019-08-15 NOTE — Progress Notes (Signed)
Patient ID: Betty Gonzalez, female    DOB: [birth year]  Age: 69 y.o. MRN: [id number]  Chief Complaint  Patient presents with  . Herpes Zoster    urgent care 6/25- states took valtrex and steroids - completed.  states pcp is [name] in [place]     Subjective:   About 10 days ago the patient started getting some rash on her left and upper chest wall.  It spread around to the central upper chest, left side of her neck, and on her left breast.  She went to see a walk-in clinic PA who treated her for possible shingles with 5 days of prednisone and a 10-day course of valacyclovir.  She came back in today for recheck.  It never really caused pain.  It itched.  She does do yard work.  She has dogs that run outdoors and, manageable over her.  Current allergies, medications, problem list, past/family and social histories reviewed.  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  No other dermatomal pattern.  No rash elsewhere on her body.  She had a little bite on her left leg.  She does have a history of having had Lyme disease a few years ago.  Assessment & Plan:   Assessment: 1. Contact dermatitis due to plants, except food, unspecified contact dermatitis type       Plan: Actions  No orders of the defined types were placed in this encounter.   No orders of the defined types were placed in this encounter.        Patient Instructions    This rash has the typical appearance of a contact dermatitis, probably a poison ivy, which is drying up at this stage.  Use the triamcinolone cream 3 times daily on the affected skin.  Over the next week it should pretty well clear on up, though sometimes it will crop back up a little bit so keep  some cream for further use if needed.  I feel fairly confident this is not shingles.  Return as necessary   Contact Dermatitis Dermatitis is redness, soreness, and swelling (inflammation) of the skin. Contact dermatitis is a reaction to certain substances that touch the skin. Many different substances can cause contact dermatitis. There are two types of contact dermatitis:  Irritant contact dermatitis. This type is caused by something that irritates your skin, such as having dry hands from washing them too often with soap. This type does not require previous exposure to the substance for a reaction to occur. This is the most common type.  Allergic contact dermatitis. This type is caused by a substance that you are allergic to, such as poison ivy. This type occurs when you have been exposed to the substance (allergen) and develop a sensitivity to it. Dermatitis may develop soon after your first exposure to the allergen, or it may not develop until the next time you are exposed and every time thereafter. What are the causes? Irritant contact dermatitis is most commonly caused by exposure to:  Makeup.  Soaps.  Detergents.  Bleaches.  Acids.  Metal salts, such as nickel. Allergic contact dermatitis is most commonly caused by  exposure to:  Poisonous plants.  Chemicals.  Jewelry.  Latex.  Medicines.  Preservatives in products, such as clothing. What increases the risk? You are more likely to develop this condition if you have:  A job that exposes you to irritants or allergens.  Certain medical conditions, such as asthma or eczema. What are the signs or symptoms? Symptoms of this condition may occur on your body anywhere the irritant has touched you or is touched by you.  Symptoms include: ? Dryness or flaking. ? Redness. ? Cracks. ? Itching. ? Pain or a burning feeling. ? Blisters. ? Drainage of small amounts of blood or clear fluid from skin cracks. With allergic  contact dermatitis, there may also be swelling in areas such as the eyelids, mouth, or genitals. How is this diagnosed? This condition is diagnosed with a medical history and physical exam.  A patch skin test may be performed to help determine the cause.  If the condition is related to your job, you may need to see an occupational medicine specialist. How is this treated? This condition is treated by checking for the cause of the reaction and protecting your skin from further contact. Treatment may also include:  Steroid creams or ointments. Oral steroid medicines may be needed in more severe cases.  Antibiotic medicines or antibacterial ointments, if a skin infection is present.  Antihistamine lotion or an antihistamine taken by mouth to ease itching.  A bandage (dressing). Follow these instructions at home: Skin care  Moisturize your skin as needed.  Apply cool compresses to the affected areas.  Try applying baking soda paste to your skin. Stir water into baking soda until it reaches a paste-like consistency.  Do not scratch your skin, and avoid friction to the affected area.  Avoid the use of soaps, perfumes, and dyes. Medicines  Take or apply over-the-counter and prescription medicines only as told by your health care provider.  If you were prescribed an antibiotic medicine, take or apply the antibiotic as told by your health care provider. Do not stop using the antibiotic even if your condition improves. Bathing  Try taking a bath with: ? Epsom salts. Follow the instructions on the packaging. You can get these at your local pharmacy or grocery store. ? Baking soda. Pour a small amount into the bath as directed by your health care provider. ? Colloidal oatmeal. Follow the instructions on the packaging. You can get this at your local pharmacy or grocery store.  Bathe less frequently, such as every other day.  Bathe in lukewarm water. Avoid using hot water. Bandage  care  If you were given a bandage (dressing), change it as told by your health care provider.  Wash your hands with soap and water before and after you change your dressing. If soap and water are not available, use hand sanitizer. General instructions  Avoid the substance that caused your reaction. If you do not know what caused it, keep a journal to try to track what caused it. Write down: ? What you eat. ? What cosmetic products you use. ? What you drink. ? What you wear in the affected area. This includes jewelry.  Check the affected areas every day for signs of infection. Check for: ? More redness, swelling, or pain. ? More fluid or blood. ? Warmth. ? Pus or a bad smell.  Keep all follow-up visits as told by your health care provider. This is important. Contact a health care provider if:  Your condition does not  improve with treatment.  Your condition gets worse.  You have signs of infection such as swelling, tenderness, redness, soreness, or warmth in the affected area.  You have a fever.  You have new symptoms. Get help right away if:  You have a severe headache, neck pain, or neck stiffness.  You vomit.  You feel very sleepy.  You notice red streaks coming from the affected area.  Your bone or joint underneath the affected area becomes painful after the skin has healed.  The affected area turns darker.  You have difficulty breathing. Summary  Dermatitis is redness, soreness, and swelling (inflammation) of the skin. Contact dermatitis is a reaction to certain substances that touch the skin.  Symptoms of this condition may occur on your body anywhere the irritant has touched you or is touched by you.  This condition is treated by figuring out what caused the reaction and protecting your skin from further contact. Treatment may also include medicines and skin care.  Avoid the substance that caused your reaction. If you do not know what caused it, keep a  journal to try to track what caused it.  Contact a health care provider if your condition gets worse or you have signs of infection such as swelling, tenderness, redness, soreness, or warmth in the affected area. This information is not intended to replace advice given to you by your health care provider. Make sure you discuss any questions you have with your health care provider. Document Revised: 05/23/2018 Document Reviewed: 08/16/2017 Elsevier Patient Education  [employer].   If you have lab work done today you will be contacted with your lab results within the next 2 weeks.  If you have not heard from [place] then please contact us. The fastest way to get your results is to register for My Chart.   IF you received an x-ray today, you will receive an invoice from [facility]. Please contact [facility] at [phone] with questions or concerns regarding your invoice.   IF you received labwork today, you will receive an invoice from [place]. Please contact [facility] at [phone] with questions or concerns regarding your invoice.   Our billing staff will not be able to assist you with questions regarding bills from these companies.  You will be contacted with the lab results as soon as they are available. The fastest way to get your results is to activate your My Chart account. Instructions are located on the last page of this paperwork. If you have not heard from [place] regarding the results in 2 weeks, please contact this office.         No follow-ups on file.   Janace Hoard, MD 08/15/2019

## 2019-08-15 NOTE — Patient Instructions (Addendum)
This rash has the typical appearance of a contact dermatitis, probably a poison ivy, which is drying up at this stage.  Use the triamcinolone cream 3 times daily on the affected skin.  Over the next week it should pretty well clear on up, though sometimes it will crop back up a little bit so keep some cream for further use if needed.  I feel fairly confident this is not shingles.  Return as necessary   Contact Dermatitis Dermatitis is redness, soreness, and swelling (inflammation) of the skin. Contact dermatitis is a reaction to certain substances that touch the skin. Many different substances can cause contact dermatitis. There are two types of contact dermatitis:  Irritant contact dermatitis. This type is caused by something that irritates your skin, such as having dry hands from washing them too often with soap. This type does not require previous exposure to the substance for a reaction to occur. This is the most common type.  Allergic contact dermatitis. This type is caused by a substance that you are allergic to, such as poison ivy. This type occurs when you have been exposed to the substance (allergen) and develop a sensitivity to it. Dermatitis may develop soon after your first exposure to the allergen, or it may not develop until the next time you are exposed and every time thereafter. What are the causes? Irritant contact dermatitis is most commonly caused by exposure to:  Makeup.  Soaps.  Detergents.  Bleaches.  Acids.  Metal salts, such as nickel. Allergic contact dermatitis is most commonly caused by exposure to:  Poisonous plants.  Chemicals.  Jewelry.  Latex.  Medicines.  Preservatives in products, such as clothing. What increases the risk? You are more likely to develop this condition if you have:  A job that exposes you to irritants or allergens.  Certain medical conditions, such as asthma or eczema. What are the signs or symptoms? Symptoms of this  condition may occur on your body anywhere the irritant has touched you or is touched by you.  Symptoms include: ? Dryness or flaking. ? Redness. ? Cracks. ? Itching. ? Pain or a burning feeling. ? Blisters. ? Drainage of small amounts of blood or clear fluid from skin cracks. With allergic contact dermatitis, there may also be swelling in areas such as the eyelids, mouth, or genitals. How is this diagnosed? This condition is diagnosed with a medical history and physical exam.  A patch skin test may be performed to help determine the cause.  If the condition is related to your job, you may need to see an occupational medicine specialist. How is this treated? This condition is treated by checking for the cause of the reaction and protecting your skin from further contact. Treatment may also include:  Steroid creams or ointments. Oral steroid medicines may be needed in more severe cases.  Antibiotic medicines or antibacterial ointments, if a skin infection is present.  Antihistamine lotion or an antihistamine taken by mouth to ease itching.  A bandage (dressing). Follow these instructions at home: Skin care  Moisturize your skin as needed.  Apply cool compresses to the affected areas.  Try applying baking soda paste to your skin. Stir water into baking soda until it reaches a paste-like consistency.  Do not scratch your skin, and avoid friction to the affected area.  Avoid the use of soaps, perfumes, and dyes. Medicines  Take or apply over-the-counter and prescription medicines only as told by your health care provider.  If you were  prescribed an antibiotic medicine, take or apply the antibiotic as told by your health care provider. Do not stop using the antibiotic even if your condition improves. Bathing  Try taking a bath with: ? Epsom salts. Follow the instructions on the packaging. You can get these at your local pharmacy or grocery store. ? Baking soda. Pour a small  amount into the bath as directed by your health care provider. ? Colloidal oatmeal. Follow the instructions on the packaging. You can get this at your local pharmacy or grocery store.  Bathe less frequently, such as every other day.  Bathe in lukewarm water. Avoid using hot water. Bandage care  If you were given a bandage (dressing), change it as told by your health care provider.  Wash your hands with soap and water before and after you change your dressing. If soap and water are not available, use hand sanitizer. General instructions  Avoid the substance that caused your reaction. If you do not know what caused it, keep a journal to try to track what caused it. Write down: ? What you eat. ? What cosmetic products you use. ? What you drink. ? What you wear in the affected area. This includes jewelry.  Check the affected areas every day for signs of infection. Check for: ? More redness, swelling, or pain. ? More fluid or blood. ? Warmth. ? Pus or a bad smell.  Keep all follow-up visits as told by your health care provider. This is important. Contact a health care provider if:  Your condition does not improve with treatment.  Your condition gets worse.  You have signs of infection such as swelling, tenderness, redness, soreness, or warmth in the affected area.  You have a fever.  You have new symptoms. Get help right away if:  You have a severe headache, neck pain, or neck stiffness.  You vomit.  You feel very sleepy.  You notice red streaks coming from the affected area.  Your bone or joint underneath the affected area becomes painful after the skin has healed.  The affected area turns darker.  You have difficulty breathing. Summary  Dermatitis is redness, soreness, and swelling (inflammation) of the skin. Contact dermatitis is a reaction to certain substances that touch the skin.  Symptoms of this condition may occur on your body anywhere the irritant has  touched you or is touched by you.  This condition is treated by figuring out what caused the reaction and protecting your skin from further contact. Treatment may also include medicines and skin care.  Avoid the substance that caused your reaction. If you do not know what caused it, keep a journal to try to track what caused it.  Contact a health care provider if your condition gets worse or you have signs of infection such as swelling, tenderness, redness, soreness, or warmth in the affected area. This information is not intended to replace advice given to you by your health care provider. Make sure you discuss any questions you have with your health care provider. Document Revised: 05/23/2018 Document Reviewed: 08/16/2017 Elsevier Patient Education  [employer].   If you have lab work done today you will be contacted with your lab results within the next 2 weeks.  If you have not heard from [place] then please contact us. The fastest way to get your results is to register for My Chart.   IF you received an x-ray today, you will receive an invoice from [facility]. Please contact [facility]  Radiology at [phone] with questions or concerns regarding your invoice.   IF you received labwork today, you will receive an invoice from [facility]. Please contact [facility] at [phone] with questions or concerns regarding your invoice.   Our billing staff will not be able to assist you with questions regarding bills from these companies.  You will be contacted with the lab results as soon as they are available. The fastest way to get your results is to activate your My Chart account. Instructions are located on the last page of this paperwork. If you have not heard from [place] regarding the results in 2 weeks, please contact this office.

## 2020-01-28 ENCOUNTER — Ambulatory Visit: Payer: Medicare Other | Admitting: Cardiology

## 2020-01-28 ENCOUNTER — Encounter: Payer: Self-pay | Admitting: Cardiology

## 2020-01-28 ENCOUNTER — Other Ambulatory Visit: Payer: Self-pay

## 2020-01-28 VITALS — BP 138/77 | HR 95 | Ht 67.0 in | Wt 171.0 lb

## 2020-01-28 DIAGNOSIS — Z8616 Personal history of COVID-19: Secondary | ICD-10-CM

## 2020-01-28 DIAGNOSIS — R011 Cardiac murmur, unspecified: Secondary | ICD-10-CM

## 2020-01-28 DIAGNOSIS — E7800 Pure hypercholesterolemia, unspecified: Secondary | ICD-10-CM

## 2020-01-28 DIAGNOSIS — R0602 Shortness of breath: Secondary | ICD-10-CM

## 2020-01-28 DIAGNOSIS — Z789 Other specified health status: Secondary | ICD-10-CM

## 2020-01-28 DIAGNOSIS — R0989 Other specified symptoms and signs involving the circulatory and respiratory systems: Secondary | ICD-10-CM

## 2020-01-28 DIAGNOSIS — E119 Type 2 diabetes mellitus without complications: Secondary | ICD-10-CM

## 2020-01-28 DIAGNOSIS — Z87891 Personal history of nicotine dependence: Secondary | ICD-10-CM

## 2020-01-28 NOTE — Progress Notes (Signed)
Date:  01/28/2020   ID:  Betty Gonzalez, DOB Apr 22, 1950, MRN [id number]  PCP:  Ofilia Neas, PA-C  Cardiologist:  Tessa Lerner, DO, [facility] (established care 01/28/2020)  REASON FOR CONSULT: Dyspnea on exertion.   REQUESTING PHYSICIAN:  McVey, Madelaine Bhat, PA-C [address] [place],  [place] [address]  Chief Complaint  Patient presents with  . Shortness of Breath  . Chest Pain    HPI  Betty Gonzalez is a 69 y.o. female who presents to the office with a chief complaint of " chest pain and shortness of breath." Patient's past medical history and cardiovascular risk factors include: Hx of COVID (dx 10/2019), NIDDM, uncontrolled/untreated hyperlipidemia, statin intolerant, hypertriglyceridemia, former smoker, postmenopausal female, advanced age.  She is referred to the office at the request of McVey, Garner Gavel* for evaluation of chest pain shortness of breath.  Shortness of breath: Patient states that she was recently diagnosed with COVID-19 infection back in 2021 and since then has been having difficulty with breathing.  Initially she thought it was most likely secondary to inflammation and did not seek medical attention.  However, since the symptoms are still present she discussed it with her PCP and was referred to cardiology for further evaluation and management.  Patient stated that her symptoms are dominated with effort related activities.  Symptoms improved with resting and relaxing.  Patient states that she still dances 2-3 times per week and has not noticed any significant change in overall physical endurance.  Chest pain: Discomfort is located over the left anterior chest wall, pain is not brought on by effort related activities and did not resolve with rest.  Pain is overall located at the site of her shingles in the past.  Review of outside medical records notes that she also carries a history of hyperlipidemia.  Her hyperlipidemia is untreated as she  has been intolerant to statin therapy in the past.  However, she does not recall which statins she has tried.  We also discussed nonstatin medication such as Zetia, Nexletol, and PCSK9 inhibitors.  Patient states that she has not been on such class of medications in the past.  FUNCTIONAL STATUS: Dancing 2-3 times a week. Walks her dog everyday.    ALLERGIES: Allergies  Allergen Reactions  . Codeine   . Other     Thermasol - active ingredient in the flu shot -- made eyes swell up  Statins - affects muscles in legs  . Sulfonamide Derivatives   . Pedi-Pre Tape Spray [Wound Dressing Adhesive] Other (See Comments)    Adhesive Tape;  Blisters   . Tape Rash and Other (See Comments)    Adhesive Tape;  Blisters     MEDICATION LIST PRIOR TO VISIT: Current Meds  Medication Sig  . aspirin 81 MG chewable tablet Chew 81 mg by mouth daily.  Marland Kitchen doxycycline (VIBRAMYCIN) 100 MG capsule Take 100 mg by mouth 2 (two) times daily.  . Echinacea 400 MG CAPS Take by mouth.  . escitalopram (LEXAPRO) 5 MG tablet Take 5 mg by mouth daily.  . fluticasone (FLONASE) 50 MCG/ACT nasal spray Place 2 sprays into both nostrils daily.  . Glucosamine 500 MG CAPS Take by mouth.  Marland Kitchen LORazepam (ATIVAN) 1 MG tablet Take 1 tablet (1 mg total) by mouth daily as needed for anxiety. No refills until 09/29/2017  . Magnesium 250 MG TABS Take by mouth.  . metFORMIN (GLUCOPHAGE-XR) 500 MG 24 hr tablet TAKE 1 TABLET(500 MG) BY MOUTH  DAILY WITH BREAKFAST  . omega-3 acid ethyl esters (LOVAZA) 1 g capsule Take 1 g by mouth 2 (two) times daily.  . Zinc Sulfate (ZINC-220 PO) Take by mouth.     PAST MEDICAL HISTORY: Past Medical History:  Diagnosis Date  . Chronic kidney disease   . Diabetes mellitus without complication (HCC)   . Hyperlipidemia   . Lyme disease     PAST SURGICAL HISTORY: History reviewed. No pertinent surgical history.  FAMILY HISTORY: The patient family history includes Dementia in her mother; Healthy in her  father.  SOCIAL HISTORY:  The patient  reports that she has quit smoking. Her smoking use included cigars and cigarettes. She has never used smokeless tobacco. She reports current drug use. Drug: Marijuana. She reports that she does not drink alcohol.  REVIEW OF SYSTEMS: Review of Systems  Constitutional: Negative for chills and fever.  HENT: Negative for hoarse voice and nosebleeds.   Eyes: Negative for discharge, double vision and pain.  Cardiovascular: Positive for chest pain and dyspnea on exertion. Negative for claudication, leg swelling, near-syncope, orthopnea, palpitations, paroxysmal nocturnal dyspnea and syncope.  Respiratory: Negative for hemoptysis and shortness of breath.   Musculoskeletal: Negative for muscle cramps and myalgias.  Gastrointestinal: Negative for abdominal pain, constipation, diarrhea, hematemesis, hematochezia, melena, nausea and vomiting.  Neurological: Negative for dizziness and light-headedness.    PHYSICAL EXAM: Vitals with BMI 01/28/2020 08/15/2019 08/09/2019  Height 5\' 7"  5\' 7"  -  Weight 171 lbs 168 lbs -  BMI 26.78 26.31 -  Systolic 138 127 [id number]  Diastolic 77 72 77  Pulse 95 79 80    CONSTITUTIONAL: Well-developed and well-nourished. No acute distress.  SKIN: Skin is warm and dry. No rash noted. No cyanosis. No pallor. No jaundice HEAD: Normocephalic and atraumatic.  EYES: No scleral icterus. Arcus senile  MOUTH/THROAT: Moist oral membranes.  NECK: No JVD present. No thyromegaly noted. Bilateral carotid bruits  LYMPHATIC: No visible cervical adenopathy.  CHEST Normal respiratory effort. No intercostal retractions  LUNGS: Clear to auscultation bilaterally. No stridor. No wheezes. No rales.  CARDIOVASCULAR: Regular rate and rhythm, positive S1-S2, +SEM 2RICS, no rubs or gallops appreciated. ABDOMINAL: No apparent ascites.  EXTREMITIES: No peripheral edema  HEMATOLOGIC: No significant bruising NEUROLOGIC: Oriented to person, place, and time.  Nonfocal. Normal muscle tone.  PSYCHIATRIC: Normal mood and affect. Normal behavior. Cooperative  CARDIAC DATABASE: EKG: 01/28/2020: Normal sinus rhythm, 79 bpm, normal axis, without underlying ischemia or injury pattern.  Echocardiogram: No results found for this or any previous visit from the past 1095 days.   Stress Testing: No results found for this or any previous visit from the past 1095 days.  Heart Catheterization: None  LABORATORY DATA: CBC Latest Ref Rng & Units 08/28/2017 08/07/2017 02/03/2017  WBC 3.4 - 10.8 x10E3/uL 4.6 7.2 6.1  Hemoglobin 11.1 - 15.9 g/dL 29.513.3 28.414.1 13.213.8  Hematocrit 34.0 - 46.6 % 38.6 43.4 40.9  Platelets 150 - 450 x10E3/uL 260 - -    CMP Latest Ref Rng & Units 08/28/2017 08/07/2017 02/01/2016  Glucose 65 - 99 mg/dL 440(N101(H) - 027(O111(H)  BUN 8 - 27 mg/dL 16 - 15  Creatinine 5.360.57 - 1.00 mg/dL 6.440.92 - 0.340.89  Sodium 742134 - 144 mmol/L 139 - 142  Potassium 3.5 - 5.2 mmol/L 4.5 - 3.7  Chloride 96 - 106 mmol/L 103 - 100  CO2 20 - 29 mmol/L 25 - 25  Calcium 8.7 - 10.3 mg/dL 9.6 - 8.9  Total Protein 6.0 - 8.5 g/dL  7.2 7.5 7.5  Total Bilirubin 0.0 - 1.2 mg/dL 0.3 0.3 0.3  Alkaline Phos 39 - 117 IU/L 84 86 90  AST 0 - 40 IU/L 16 19 27   ALT 0 - 32 IU/L 15 17 21     Lipid Panel     Component Value Date/Time   CHOL 234 (H) 09/19/2016 1029   TRIG 293 (H) 09/19/2016 1029   HDL 45 09/19/2016 1029   CHOLHDL 5.2 (H) 09/19/2016 1029   LDLCALC 130 (H) 09/19/2016 1029   LABVLDL 59 (H) 09/19/2016 1029    No components found for: NTPROBNP No results for input(s): PROBNP in the last 8760 hours. No results for input(s): TSH in the last 8760 hours.  BMP No results for input(s): NA, K, CL, CO2, GLUCOSE, BUN, CREATININE, CALCIUM, GFRNONAA, GFRAA in the last 8760 hours.  HEMOGLOBIN A1C Lab Results  Component Value Date   HGBA1C 6.8 (H) 09/25/2017   MPG 146 (H) 08/12/2014   External Labs: Collected: [facility] records provided by the patient 01/23/2020 Hemoglobin  13.8 g/dL, hematocrit 08/14/2014 TSH: 14/10/2019  Collected: [facility] records were provided by the patient November 15, 2019 Total cholesterol 279, triglycerides 283, HDL 41, LDL 181 Sodium 140, potassium 4.2, chloride 105, bicarb 27, BUN 12, creatinine 0.8,  AST 33, ALT 38, alkaline phosphatase 68 Hemoglobin A1c 7.6%  IMPRESSION:    ICD-10-CM   1. SOB (shortness of breath)  R06.02 EKG 12-Lead    PCV ECHOCARDIOGRAM COMPLETE    PCV MYOCARDIAL PERFUSION WO LEXISCAN    SARS-COV-2 RNA,(COVID-19) QUAL NAAT  2. Systolic murmur  3.64 PCV ECHOCARDIOGRAM COMPLETE  3. Non-insulin dependent type 2 diabetes mellitus (HCC)  E11.9   4. Hypercholesteremia  E78.00   5. Statin intolerance  Z78.9   6. Former smoker  Z87.891   7. History of COVID-19  Z86.16   8. Bilateral carotid bruits  R09.89 PCV CAROTID DUPLEX (BILATERAL)     RECOMMENDATIONS: Betty Gonzalez is a 69 y.o. female whose past medical history and cardiac risk factors include: Hx of COVID (dx 10/2019), NIDDM, uncontrolled/untreated hyperlipidemia, statin intolerant, hypertriglyceridemia, former smoker, postmenopausal female, advanced age.  Dyspnea on exertion & chest pain:  Patient was referred to the office for evaluation of chest pain and shortness of breath.    Patient's chest pain is atypical in nature and may be secondary to post shingles neuropathy/discomfort.  Will defer further management to PCP.    In contrast, given her symptoms of effort related dyspnea and multiple cardiovascular risk factors and ischemic evaluation is warranted.    Echocardiogram will be ordered to evaluate for structural heart disease and left ventricular systolic function.    Plan exercise nuclear stress test to evaluate for exercise-induced ischemia.  Covid screen prior to stress testing.  Physical examination also consistent with systolic ejection murmur concerning for underlying aortic stenosis.  Echocardiogram will be ordered to evaluate for structural heart  disease and left ventricular systolic function.  Patient also has carotid bruits left greater than right this may be secondary to underlying aortic valve disease or coronary atherosclerosis.  Plan carotid duplex.  Non-insulin-dependent diabetes mellitus type 2: Hemoglobin A1c not well controlled.  Patient educated on importance of glycemic control.  Currently managed by primary care provider.  Hyperlipidemia (uncontrolled/untreated):   Claims to be intolerant to statin therapy.  Patient does not recall which medications she has been on in the past.   Recommended initiating PCSK9 inhibitors as her LDL is 181mg /dL with findings of arcus senilis  on physical examination.  Patient understands that her goal LDL should be less than 70 mg/dL given her history of non-insulin-dependent diabetes.    Patient like to discuss with PCP prior to initiating pharmacological therapy.  Will defer further management to PCP.  Former smoker: Educated on the importance of continued smoking cessation.  FINAL MEDICATION LIST END OF ENCOUNTER: No orders of the defined types were placed in this encounter.    Current Outpatient Medications:  .  aspirin 81 MG chewable tablet, Chew 81 mg by mouth daily., Disp: , Rfl:  .  doxycycline (VIBRAMYCIN) 100 MG capsule, Take 100 mg by mouth 2 (two) times daily., Disp: , Rfl:  .  Echinacea 400 MG CAPS, Take by mouth., Disp: , Rfl:  .  escitalopram (LEXAPRO) 5 MG tablet, Take 5 mg by mouth daily., Disp: , Rfl:  .  fluticasone (FLONASE) 50 MCG/ACT nasal spray, Place 2 sprays into both nostrils daily., Disp: 16 g, Rfl: 6 .  Glucosamine 500 MG CAPS, Take by mouth., Disp: , Rfl:  .  LORazepam (ATIVAN) 1 MG tablet, Take 1 tablet (1 mg total) by mouth daily as needed for anxiety. No refills until 09/29/2017, Disp: 90 tablet, Rfl: 1 .  Magnesium 250 MG TABS, Take by mouth., Disp: , Rfl:  .  metFORMIN (GLUCOPHAGE-XR) 500 MG 24 hr tablet, TAKE 1 TABLET(500 MG) BY MOUTH DAILY WITH  BREAKFAST, Disp: 30 tablet, Rfl: 0 .  omega-3 acid ethyl esters (LOVAZA) 1 g capsule, Take 1 g by mouth 2 (two) times daily., Disp: , Rfl:  .  Zinc Sulfate (ZINC-220 PO), Take by mouth., Disp: , Rfl:   Orders Placed This Encounter  Procedures  . SARS-COV-2 RNA,(COVID-19) QUAL NAAT  . PCV MYOCARDIAL PERFUSION WO LEXISCAN  . EKG 12-Lead  . PCV ECHOCARDIOGRAM COMPLETE  . PCV CAROTID DUPLEX (BILATERAL)   There are no Patient Instructions on file for this visit.   --Continue cardiac medications as reconciled in final medication list. --Return in about 4 weeks (around 02/25/2020) for Reevaluation of, Dyspnea, Review test results. Or sooner if needed. --Continue follow-up with your primary care physician regarding the management of your other chronic comorbid conditions.  Patient's questions and concerns were addressed to her satisfaction. She voices understanding of the instructions provided during this encounter.   This note was created using a voice recognition software as a result there may be grammatical errors inadvertently enclosed that do not reflect the nature of this encounter. Every attempt is made to correct such errors.  Tessa Lerner, [place], [facility]  Pager: [phone] Office: [phone]

## 2020-02-10 ENCOUNTER — Other Ambulatory Visit: Payer: Self-pay

## 2020-02-10 ENCOUNTER — Ambulatory Visit: Payer: Medicare Other

## 2020-02-10 DIAGNOSIS — R0602 Shortness of breath: Secondary | ICD-10-CM

## 2020-02-20 ENCOUNTER — Other Ambulatory Visit: Payer: Medicare Other

## 2020-02-28 ENCOUNTER — Other Ambulatory Visit: Payer: Self-pay

## 2020-02-28 ENCOUNTER — Ambulatory Visit: Payer: Medicare Other

## 2020-02-28 DIAGNOSIS — R0602 Shortness of breath: Secondary | ICD-10-CM

## 2020-02-28 DIAGNOSIS — R0989 Other specified symptoms and signs involving the circulatory and respiratory systems: Secondary | ICD-10-CM

## 2020-02-28 DIAGNOSIS — R011 Cardiac murmur, unspecified: Secondary | ICD-10-CM

## 2020-06-02 ENCOUNTER — Ambulatory Visit
Admission: EM | Admit: 2020-06-02 | Discharge: 2020-06-02 | Disposition: A | Discharge status: Home / Self Care | Payer: Medicare Other | Attending: Family Medicine | Admitting: Family Medicine

## 2020-06-02 ENCOUNTER — Other Ambulatory Visit: Payer: Self-pay

## 2020-06-02 ENCOUNTER — Encounter: Payer: Self-pay | Admitting: Emergency Medicine

## 2020-06-02 ENCOUNTER — Ambulatory Visit (INDEPENDENT_AMBULATORY_CARE_PROVIDER_SITE_OTHER): Payer: Medicare Other

## 2020-06-02 DIAGNOSIS — M25531 Pain in right wrist: Secondary | ICD-10-CM

## 2020-06-02 DIAGNOSIS — W19XXXA Unspecified fall, initial encounter: Secondary | ICD-10-CM

## 2020-06-02 DIAGNOSIS — S52501A Unspecified fracture of the lower end of right radius, initial encounter for closed fracture: Secondary | ICD-10-CM

## 2020-06-02 MED ORDER — TRAMADOL HCL 50 MG PO TABS: 50.0000 mg | ORAL_TABLET | Freq: Four times a day (QID) | ORAL | 0 refills | Status: DC | PRN

## 2020-06-02 MED ORDER — ACETAMINOPHEN 325 MG PO TABS
650.0000 mg | ORAL_TABLET | Freq: Once | ORAL | Status: AC
  Administered 2020-06-02: 650 mg via ORAL

## 2020-06-02 NOTE — ED Triage Notes (Signed)
RT wrist pain that radiates to RT forearm and RT hand.  Pt fell around 10 this morning.

## 2020-06-02 NOTE — Discharge Instructions (Signed)
We have placed a splint to your right wrist.  I have sent in tramadol for you to take for breakthrough pain, 1 tablet every 6 hours as needed  Follow up with orthopedics for further management

## 2020-06-02 NOTE — ED Provider Notes (Signed)
[facility]   [id number] 06/02/20 Arrival Time: 1411  AC:ZYSAY PAIN  SUBJECTIVE: History from: patient. Betty Gonzalez is a 70 y.o. female complains of R wrist pain that began this morning after a mechanical fall and catching herself with her right arm. Describes the pain as constant and achy in character with intermittent sharp pain. Has tried OTC medications without relief. Symptoms are made worse with activity.  Denies similar symptoms in the past. Denies fever, chills, weakness, numbness and tingling, saddle paresthesias, loss of bowel or bladder function.      ROS: As per HPI.  All other pertinent ROS negative.     Past Medical History:  Diagnosis Date  . Chronic kidney disease   . Diabetes mellitus without complication (HCC)   . Hyperlipidemia   . Lyme disease    Past Surgical History:  Procedure Laterality Date  . NASAL SEPTUM SURGERY     Allergies  Allergen Reactions  . Codeine   . Other     Thermasol - active ingredient in the flu shot -- made eyes swell up  Statins - affects muscles in legs  . Sulfonamide Derivatives   . Pedi-Pre Tape Spray [Wound Dressing Adhesive] Other (See Comments)    Adhesive Tape;  Blisters   . Tape Rash and Other (See Comments)    Adhesive Tape;  Blisters    No current facility-administered medications on file prior to encounter.   Current Outpatient Medications on File Prior to Encounter  Medication Sig Dispense Refill  . aspirin 81 MG chewable tablet Chew 81 mg by mouth daily.    Marland Kitchen doxycycline (VIBRAMYCIN) 100 MG capsule Take 100 mg by mouth 2 (two) times daily.    . Echinacea 400 MG CAPS Take by mouth.    . escitalopram (LEXAPRO) 5 MG tablet Take 5 mg by mouth daily.    . fluticasone (FLONASE) 50 MCG/ACT nasal spray Place 2 sprays into both nostrils daily. 16 g 6  . Glucosamine 500 MG CAPS Take by mouth.    Marland Kitchen LORazepam (ATIVAN) 1 MG tablet Take 1 tablet (1 mg total) by mouth daily as needed for anxiety. No refills until  09/29/2017 90 tablet 1  . Magnesium 250 MG TABS Take by mouth.    . metFORMIN (GLUCOPHAGE-XR) 500 MG 24 hr tablet TAKE 1 TABLET(500 MG) BY MOUTH DAILY WITH BREAKFAST 30 tablet 0  . omega-3 acid ethyl esters (LOVAZA) 1 g capsule Take 1 g by mouth 2 (two) times daily.    . Zinc Sulfate (ZINC-220 PO) Take by mouth.     Social History   Socioeconomic History  . Marital status: Single    Spouse name: Not on file  . Number of children: Not on file  . Years of education: Not on file  . Highest education level: Not on file  Occupational History  . Not on file  Tobacco Use  . Smoking status: Former Smoker    Types: Cigars, Cigarettes  . Smokeless tobacco: Never Used  . Tobacco comment: 1 cigar occasion  Vaping Use  . Vaping Use: Never used  Substance and Sexual Activity  . Alcohol use: No    Alcohol/week: 0.0 standard drinks  . Drug use: Yes    Types: Marijuana  . Sexual activity: Not Currently  Other Topics Concern  . Not on file  Social History Narrative  . Not on file   Social Determinants of Health   Financial Resource Strain: Not on file  Food Insecurity: Not on  file  Transportation Needs: Not on file  Physical Activity: Not on file  Stress: Not on file  Social Connections: Not on file  Intimate Partner Violence: Not on file   Family History  Problem Relation Age of Onset  . Dementia Mother   . Healthy Father     OBJECTIVE:  Vitals:   06/02/20 1500  BP: 111/70  Pulse: 60  Resp: 19  Temp: 97.6 F (36.4 C)  TempSrc: Oral  SpO2: 95%    General appearance: ALERT; in no acute distress.  Head: NCAT Lungs: Normal respiratory effort CV: pulses 2+ bilaterally. Cap refill < 2 seconds Musculoskeletal:  Inspection: Skin warm, dry, clear and intact Erythema, effusion, ecchymosis to r wrist Palpation: R wrist tender to palpation ROM: Limited ROM active and passive to R wrist Skin: warm and dry Neurologic: Ambulates without difficulty; Sensation intact about the  upper/ lower extremities Psychological: alert and cooperative; normal mood and affect  DIAGNOSTIC STUDIES:  DG Wrist Complete Right  Result Date: 06/02/2020 CLINICAL DATA:  Left wrist pain after fall EXAM: RIGHT WRIST - COMPLETE 3+ VIEW COMPARISON:  None. FINDINGS: Transversely oriented area of subtle cortical irregularity at the distal radial metaphysis concerning for a minimally impacted distal radial fracture. There is slight prominence of the pronator fat pad. Distal ulna appears intact. Carpal bones and carpal intraosseous spaces are preserved. Mild arthropathy of the first [facility] joint. Osseous structures are slightly demineralized. Mild soft tissue swelling. IMPRESSION: Findings concerning for a nondisplaced minimally impacted distal radius fracture. Consider CT of the wrist for further characterization, as clinically indicated. Electronically Signed   By: Duanne Guess D.O.   On: 06/02/2020 15:39     ASSESSMENT & PLAN:  1. Closed fracture of distal end of right radius, unspecified fracture morphology, initial encounter   2. Right wrist pain   3. Fall, initial encounter     Meds ordered this encounter  Medications  . acetaminophen (TYLENOL) tablet 650 mg  . traMADol (ULTRAM) 50 MG tablet    Sig: Take 1 tablet (50 mg total) by mouth every 6 (six) hours as needed.    Dispense:  15 tablet    Refill:  0    Order Specific Question:   Supervising Provider    Answer:   Merrilee Jansky [id number]   Prescribed tramadol for breakthrough pain Continue conservative management of rest, ice, and gentle stretches Take ibuprofen as needed for pain relief (may cause abdominal discomfort, ulcers, and GI bleeds avoid taking with other NSAIDs) Follow up with orthopedics  Return or go to the ER if you have any new or worsening symptoms (fever, chills, chest pain, abdominal pain, changes in bowel or bladder habits, pain radiating into lower legs)   [place] Controlled Substances Registry consulted for  this patient. I feel the risk/benefit ratio today is favorable for proceeding with this prescription for a controlled substance. Medication sedation precautions given.  Reviewed expectations re: course of current medical issues. Questions answered. Outlined signs and symptoms indicating need for more acute intervention. Patient verbalized understanding. After Visit Summary given.       Moshe Cipro, NP 06/02/20 1552

## 2020-06-04 ENCOUNTER — Encounter: Payer: Self-pay | Admitting: Orthopedic Surgery

## 2020-06-04 ENCOUNTER — Ambulatory Visit: Payer: Medicare Other | Admitting: Orthopedic Surgery

## 2020-06-04 ENCOUNTER — Other Ambulatory Visit: Payer: Self-pay

## 2020-06-04 VITALS — BP 118/59 | HR 77 | Ht 67.0 in | Wt 171.0 lb

## 2020-06-04 DIAGNOSIS — D069 Carcinoma in situ of cervix, unspecified: Secondary | ICD-10-CM | POA: Insufficient documentation

## 2020-06-04 DIAGNOSIS — S52591A Other fractures of lower end of right radius, initial encounter for closed fracture: Secondary | ICD-10-CM

## 2020-06-04 NOTE — Progress Notes (Signed)
EMERGENCY ROOM FOLLOW UP  NEW PROBLEM/PATIENT   Patient ID: Betty Gonzalez, female   DOB: 04/15/1950, 70 y.o.   MRN: [id number]   ASSESSMENT AND PLAN:  70 year old female  nondisplaced distal radius fracture right wrist recommend short arm cast for 3 weeks and then brace for 3 weeks if x-ray looks good   Emergency room record from (date)June 02, 2020  has been reviewed and this is included by reference and includes the review of systems with the following addition:   Chief Complaint  Patient presents with  . Wrist Injury    Right     HPI Betty Gonzalez is a 70 y.o. female.  Presents for evaluation of right wrist distal radius fracture  HPI Patient fell in the yard simple mechanical fall landed severe pain initially improved with splinting and medication and then with mild discomfort no other injuries no complaints shoulder   Review of Systems Review of Systems No significant related review of system abnormalities.  Vascular system negative skin  has a past medical history of Chronic kidney disease, Diabetes mellitus without complication (HCC), Hyperlipidemia, and Lyme disease.   Past Surgical History:  Procedure Laterality Date  . NASAL SEPTUM SURGERY      Family History  Problem Relation Age of Onset  . Dementia Mother   . Healthy Father     Social History Social History   Tobacco Use  . Smoking status: Former Smoker    Types: Cigars, Cigarettes  . Smokeless tobacco: Never Used  . Tobacco comment: 1 cigar occasion  Vaping Use  . Vaping Use: Never used  Substance Use Topics  . Alcohol use: No    Alcohol/week: 0.0 standard drinks  . Drug use: Yes    Types: Marijuana    Allergies  Allergen Reactions  . Codeine   . Other     Thermasol - active ingredient in the flu shot -- made eyes swell up  Statins - affects muscles in legs  . Sulfonamide Derivatives   . Pedi-Pre Tape Spray [Wound Dressing Adhesive] Other (See Comments)    Adhesive Tape;  Blisters    . Tape Rash and Other (See Comments)    Adhesive Tape;  Blisters     Current Outpatient Medications  Medication Sig Dispense Refill  . aspirin 81 MG chewable tablet Chew 81 mg by mouth daily.    . Echinacea 400 MG CAPS Take by mouth.    . escitalopram (LEXAPRO) 5 MG tablet Take 5 mg by mouth daily.    . fluticasone (FLONASE) 50 MCG/ACT nasal spray Place 2 sprays into both nostrils daily. 16 g 6  . Glucosamine 500 MG CAPS Take by mouth.    Marland Kitchen LORazepam (ATIVAN) 1 MG tablet Take 1 tablet (1 mg total) by mouth daily as needed for anxiety. No refills until 09/29/2017 90 tablet 1  . metFORMIN (GLUCOPHAGE-XR) 500 MG 24 hr tablet TAKE 1 TABLET(500 MG) BY MOUTH DAILY WITH BREAKFAST 30 tablet 0  . omega-3 acid ethyl esters (LOVAZA) 1 g capsule Take 1 g by mouth 2 (two) times daily.    . traMADol (ULTRAM) 50 MG tablet Take 1 tablet (50 mg total) by mouth every 6 (six) hours as needed. 15 tablet 0  . Zinc Sulfate (ZINC-220 PO) Take by mouth.     No current facility-administered medications for this visit.    Physical Exam BP (!) 118/59   Pulse 77   Ht 5\' 7"  (1.702 m)   Wt 171 lb (77.6  kg)   BMI 26.78 kg/m  Body mass index is 26.78 kg/m.  Well developed and well nourished  Stands with normal weight bearing line  Alert and oriented x 3  Normal affect and mood   GAIT:  Normal    Right upper extremity (STRIMVS) Sensation is intact pulses are good thyroid is normal motor exam is intact changes stable range of motion is limited by pain she is tender at the distal radius skin is intact  Data Reviewed IMAGING From THE ER AND THE REPORT ARE REVIEWED, MY INTERPRETATION OF THE IMAGE(S) IS : Nondisplaced distal radius fracture is seen on 3 views of the right wrist  Assessment  Encounter Diagnosis  Name Primary?  . Other closed fracture of distal end of right radius, initial encounter Yes     Plan   Patient will be placed in a short arm cast for 3 weeks and I will take the cast  off and take an x-ray if that looks good then we can place her in a brace until the fracture heals   Fuller Canada, MD 06/04/2020 10:39 AM

## 2020-06-08 NOTE — Progress Notes (Signed)
Patient placed in short arm cast when in office 06/04/20 she was instructed on cast care, advised to call office if cast loosens, since she was quite swollen.

## 2020-06-11 ENCOUNTER — Telehealth: Payer: Self-pay | Admitting: Orthopedic Surgery

## 2020-06-11 NOTE — Telephone Encounter (Signed)
Spoke with patient regarding her request to reschedule appointment from 5/12 to 06/29/20, due to schedule conflict. States cast hasn't gotten loose, said not moving around at all; okay to wait, or other advice

## 2020-06-11 NOTE — Telephone Encounter (Signed)
Called back to patient to notify. °

## 2020-06-25 ENCOUNTER — Encounter: Payer: Medicare Other | Admitting: Orthopedic Surgery

## 2020-06-29 ENCOUNTER — Ambulatory Visit: Payer: Medicare Other

## 2020-06-29 ENCOUNTER — Encounter: Payer: Self-pay | Admitting: Orthopedic Surgery

## 2020-06-29 ENCOUNTER — Other Ambulatory Visit: Payer: Self-pay

## 2020-06-29 ENCOUNTER — Ambulatory Visit (INDEPENDENT_AMBULATORY_CARE_PROVIDER_SITE_OTHER): Payer: Medicare Other | Admitting: Orthopedic Surgery

## 2020-06-29 VITALS — Ht 67.0 in

## 2020-06-29 DIAGNOSIS — S52591D Other fractures of lower end of right radius, subsequent encounter for closed fracture with routine healing: Secondary | ICD-10-CM | POA: Diagnosis not present

## 2020-06-29 NOTE — Progress Notes (Signed)
Chief Complaint  Patient presents with  . Wrist Injury    Right wrist pain,    Fracture care follow-up  Encounter Diagnosis  Name Primary?  . Other closed fracture of distal end of right radius with routine healing, subsequent encounter 06/02/20 Yes    April 19th date of injury  4 weeks post inj   2nd ov   fov 06-04-20  Clinically doing well occasional pain here and there at night responds well to Tylenol  Had some numbness and tingling probably from the splint being too tight  Neurovascularly intact today wrist range of motion is actually very good considering casting for 3 to 4 weeks  X-ray shows fracture healing and normal alignment  Recommend x-ray in 4 weeks  Patient was placed into a removable splint

## 2020-07-21 ENCOUNTER — Ambulatory Visit
Admission: EM | Admit: 2020-07-21 | Discharge: 2020-07-21 | Disposition: A | Discharge status: Home / Self Care | Payer: Medicare Other | Attending: Family Medicine | Admitting: Family Medicine

## 2020-07-21 ENCOUNTER — Encounter: Payer: Self-pay | Admitting: Emergency Medicine

## 2020-07-21 ENCOUNTER — Other Ambulatory Visit: Payer: Self-pay

## 2020-07-21 DIAGNOSIS — R5383 Other fatigue: Secondary | ICD-10-CM | POA: Diagnosis not present

## 2020-07-21 DIAGNOSIS — S70261A Insect bite (nonvenomous), right hip, initial encounter: Secondary | ICD-10-CM | POA: Diagnosis not present

## 2020-07-21 DIAGNOSIS — Z87891 Personal history of nicotine dependence: Secondary | ICD-10-CM | POA: Insufficient documentation

## 2020-07-21 DIAGNOSIS — B349 Viral infection, unspecified: Secondary | ICD-10-CM

## 2020-07-21 DIAGNOSIS — Z8619 Personal history of other infectious and parasitic diseases: Secondary | ICD-10-CM | POA: Diagnosis not present

## 2020-07-21 DIAGNOSIS — M545 Low back pain, unspecified: Secondary | ICD-10-CM | POA: Diagnosis not present

## 2020-07-21 DIAGNOSIS — W57XXXA Bitten or stung by nonvenomous insect and other nonvenomous arthropods, initial encounter: Secondary | ICD-10-CM | POA: Diagnosis not present

## 2020-07-21 DIAGNOSIS — R300 Dysuria: Secondary | ICD-10-CM | POA: Insufficient documentation

## 2020-07-21 LAB — POCT URINALYSIS DIP (MANUAL ENTRY)
Bilirubin, UA: NEGATIVE
Blood, UA: NEGATIVE
Glucose, UA: NEGATIVE mg/dL
Ketones, POC UA: NEGATIVE mg/dL
Leukocytes, UA: NEGATIVE
Nitrite, UA: NEGATIVE
Protein Ur, POC: NEGATIVE mg/dL
Spec Grav, UA: 1.02 (ref 1.010–1.025)
Urobilinogen, UA: 0.2 E.U./dL
pH, UA: 7 (ref 5.0–8.0)

## 2020-07-21 MED ORDER — DOXYCYCLINE HYCLATE 100 MG PO CAPS: 100.0000 mg | ORAL_CAPSULE | Freq: Two times a day (BID) | ORAL | 0 refills | Status: DC

## 2020-07-21 NOTE — ED Triage Notes (Signed)
Lower back pain states she thinks she may have a UTI.  Unable to tell nurse when pain started.

## 2020-07-21 NOTE — ED Provider Notes (Signed)
[facility]    CSN: [id number] Arrival date & time: 07/21/20  1015      History   Chief Complaint No chief complaint on file.   HPI Betty Gonzalez is a 70 y.o. female.   Reports that she has been fatigued, achy and had fever for the last 2-3 days. Also reports that she was recently bitten by 2 ticks to the right hip. States that the areas are erythematous and itchy. Has hx lyme disease.  Also reports that she thinks she may have a UTI due to her fatigue.  Denies cough, SOB, abdominal pain, nausea, vomiting, diarrhea.   ROS per HPI  The history is provided by the patient.   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   Statin intolerance 02/03/2017   Well controlled diabetes mellitus (HCC) 02/01/2016   Actinic keratoses 06/26/2013   Acute sinusitis 06/26/2013   Adjustment disorder with anxiety 06/26/2013   Allergic rhinitis 06/26/2013   Dry mouth 06/26/2013   Fatigue 06/26/2013   Hyperglycemia 06/26/2013   Hyperlipemia 06/26/2013   Incomplete emptying of bladder 06/26/2013   Kidney stone 06/26/2013   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      Home Medications    Prior to Admission medications   Medication Sig Start Date End Date Taking? Authorizing Provider  doxycycline (VIBRAMYCIN) 100 MG capsule Take 1 capsule (100 mg total) by mouth 2 (two) times daily. 07/21/20  Yes Moshe Cipro, NP  aspirin 81 MG chewable tablet Chew 81 mg by mouth  daily.    [provider]  Echinacea 400 MG CAPS Take by mouth.    [provider]  escitalopram (LEXAPRO) 5 MG tablet Take 5 mg by mouth daily.    [provider]  fluticasone (FLONASE) 50 MCG/ACT nasal spray Place 2 sprays into both nostrils daily. 09/25/17   Ofilia Neas, PA-C  Glucosamine 500 MG CAPS Take by mouth.    [provider]  LORazepam (ATIVAN) 1 MG tablet Take 1 tablet (1 mg total) by mouth daily as needed for anxiety. No refills until 09/29/2017 09/25/17   Ofilia Neas, PA-C  metFORMIN (GLUCOPHAGE-XR) 500 MG 24 hr tablet TAKE 1 TABLET(500 MG) BY MOUTH DAILY WITH BREAKFAST 10/01/18   Collie Siad A, MD  omega-3 acid ethyl esters (LOVAZA) 1 g capsule Take 1 g by mouth 2 (two) times daily.    [provider]  traMADol (ULTRAM) 50 MG tablet Take 1 tablet (50 mg total) by mouth every 6 (six) hours as needed. 06/02/20   Moshe Cipro, NP  Zinc Sulfate (ZINC-220 PO) Take by mouth.    [provider]    Family History Family History  Problem Relation Age of Onset   Dementia Mother    Healthy Father     Social History Social History   Tobacco Use   Smoking status: Former    Pack years:  0.00    Types: Cigars, Cigarettes   Smokeless tobacco: Never   Tobacco comments:    1 cigar occasion  Vaping Use   Vaping Use: Never used  Substance Use Topics   Alcohol use: No    Alcohol/week: 0.0 standard drinks   Drug use: Yes    Types: Marijuana     Allergies   Codeine, Other, Sulfonamide derivatives, Pedi-pre tape spray [wound dressing adhesive], and Tape   Review of Systems Review of Systems   Physical Exam Triage Vital Signs ED Triage Vitals  Enc Vitals Group     BP 07/21/20 1044 136/81     Pulse Rate 07/21/20 1044 83     Resp 07/21/20 1044 18     Temp 07/21/20 1044 97.9 F (36.6 C)     Temp Source 07/21/20 1044 Temporal     SpO2 07/21/20 1044 95 %     Weight --      Height --      Head  Circumference --      Peak Flow --      Pain Score 07/21/20 1045 6     Pain Loc --      Pain Edu? --      Excl. in GC? --    No data found.  Updated Vital Signs BP 136/81 (BP Location: Right Arm)   Pulse 83   Temp 97.9 F (36.6 C) (Temporal)   Resp 18   SpO2 95%   Visual Acuity Right Eye Distance:   Left Eye Distance:   Bilateral Distance:    Right Eye Near:   Left Eye Near:    Bilateral Near:     Physical Exam Vitals and nursing note reviewed.  Constitutional:      General: She is not in acute distress.    Appearance: Normal appearance. She is well-developed.  HENT:     Head: Normocephalic and atraumatic.     Nose: Nose normal.     Mouth/Throat:     Mouth: Mucous membranes are moist.     Pharynx: Oropharynx is clear.  Eyes:     Extraocular Movements: Extraocular movements intact.     Conjunctiva/sclera: Conjunctivae normal.     Pupils: Pupils are equal, round, and reactive to light.  Cardiovascular:     Rate and Rhythm: Normal rate and regular rhythm.  Pulmonary:     Effort: Pulmonary effort is normal. No respiratory distress.  Abdominal:     General: Bowel sounds are normal. There is no distension.     Palpations: Abdomen is soft. There is no mass.     Tenderness: There is no abdominal tenderness. There is no right CVA tenderness, left CVA tenderness, guarding or rebound.     Hernia: No hernia is present.  Musculoskeletal:        General: Normal range of motion.     Cervical back: Normal range of motion and neck supple.  Skin:    General: Skin is warm and dry.     Capillary Refill: Capillary refill takes less than 2 seconds.     Findings: Lesion (Small erythematous, urticarial areas to the right hip (0.25 cm in diameter)) present.  Neurological:     General: No focal deficit present.     Mental Status: She is alert and oriented to person, place, and time.  Psychiatric:        Mood and Affect: Mood normal.        Behavior: Behavior normal.  Thought Content: Thought content normal.     UC Treatments / Results  Labs (all labs ordered are listed, but only abnormal results are displayed) Labs Reviewed  URINE CULTURE - Abnormal; Notable for the following components:      Result Value   Culture MULTIPLE SPECIES PRESENT, SUGGEST RECOLLECTION (*)    All other components within normal limits  COVID-19, FLU A+B NAA   Narrative:    Test(s) 140142-Influenza A, NAA; 140143-Influenza B, NAA was developed and its performance characteristics determined by [facility]. It has not been cleared or approved by the Food and Drug Administration. Performed at:  [address] [place], [place]  [id number] Lab Director: Jolene Schimke MD, Phone:  [phone]  POCT URINALYSIS DIP (MANUAL ENTRY)    EKG   Radiology No results found.  Procedures Procedures (including critical care time)  Medications Ordered in UC Medications - No data to display  Initial Impression / Assessment and Plan / UC Course  I have reviewed the triage vital signs and the nursing notes.  Pertinent labs & imaging results that were available during my care of the patient were reviewed by me and considered in my medical decision making (see chart for details).    Low back pain Dysuria Tick bite of the right hip Fatigue History of Lyme disease  UA not fully remarkable for infection in office, will culture given symptoms Will be in contact with any abnormal results that require further treatment Doxycycline 100 mg twice daily x7 days prescribed to cover tick bites given history of Lyme disease Covid and flu swab obtained in office today.   Patient instructed to quarantine until results are back and negative.   If results are negative, patient may resume daily schedule as tolerated once they are fever free for 24 hours without the use of antipyretic medications.   If results are positive, patient instructed to quarantine for at least 5 days  from symptom onset.  If after 5 days symptoms have resolved, may return to work with a well fitting mask for the next 5 days. If symptomatic after day 5, isolation should be extended to 10 days. Patient instructed to follow-up with primary care or with this office as needed.   Patient instructed to follow-up in the ER for trouble swallowing, trouble breathing, other concerning symptoms.     Final Clinical Impressions(s) / UC Diagnoses   Final diagnoses:  Other fatigue  Acute bilateral low back pain without sciatica  Dysuria  Tick bite of right hip, initial encounter  History of Lyme disease     Discharge Instructions      I have sent in doxycycline for you to take one tablet twice a day for 10 days  Your COVID and Influenza tests are pending. You should self quarantine until the test results are back.    Take Tylenol or ibuprofen as needed for fever or discomfort. Rest and keep yourself hydrated.    Follow-up with your primary care provider if your symptoms are not improving.         ED Prescriptions     Medication Sig Dispense Auth. Provider   doxycycline (VIBRAMYCIN) 100 MG capsule Take 1 capsule (100 mg total) by mouth 2 (two) times daily. 14 capsule Moshe Cipro, NP      PDMP not reviewed this encounter.   Moshe Cipro, NP 07/24/20 [phone]

## 2020-07-21 NOTE — Discharge Instructions (Addendum)
I have sent in doxycycline for you to take one tablet twice a day for 10 days  Your COVID and Influenza tests are pending.  You should self quarantine until the test results are back.    Take Tylenol or ibuprofen as needed for fever or discomfort.  Rest and keep yourself hydrated.    Follow-up with your primary care provider if your symptoms are not improving.     

## 2020-07-22 LAB — COVID-19, FLU A+B NAA
Influenza A, NAA: NOT DETECTED
Influenza B, NAA: NOT DETECTED
SARS-CoV-2, NAA: NOT DETECTED

## 2020-07-23 LAB — URINE CULTURE

## 2020-07-27 ENCOUNTER — Encounter: Payer: Self-pay | Admitting: Orthopedic Surgery

## 2020-07-27 ENCOUNTER — Ambulatory Visit: Payer: Medicare Other | Admitting: Orthopedic Surgery

## 2020-07-27 ENCOUNTER — Other Ambulatory Visit: Payer: Self-pay

## 2020-07-27 ENCOUNTER — Ambulatory Visit: Payer: Medicare Other

## 2020-07-27 DIAGNOSIS — S52591D Other fractures of lower end of right radius, subsequent encounter for closed fracture with routine healing: Secondary | ICD-10-CM

## 2020-07-27 NOTE — Progress Notes (Signed)
Fracture care follow-up  This is approximately 6 weeks and a few days status post right distal radius fracture which was nondisplaced.  We treated her with a cast for an initial 3-week period followed by bracing  She complains of some forearm pain  Her x-ray shows fracture healing without malalignment shortening or loss of radial inclination or volar tilt  Her exam shows some tenderness over the distal radius but it is very mild and she has some tenderness in the forearm  X-ray again shows fracture healing  Recommend she wear the brace for 2 weeks for heavy activity but otherwise the brace can be removed and she is released

## 2020-07-27 NOTE — Patient Instructions (Signed)
Ok to remove the brace

## 2021-01-20 ENCOUNTER — Other Ambulatory Visit (HOSPITAL_BASED_OUTPATIENT_CLINIC_OR_DEPARTMENT_OTHER): Payer: Self-pay | Admitting: Physician Assistant

## 2021-01-20 DIAGNOSIS — R7401 Elevation of levels of liver transaminase levels: Secondary | ICD-10-CM

## 2021-01-21 ENCOUNTER — Other Ambulatory Visit: Payer: Self-pay

## 2021-01-21 ENCOUNTER — Ambulatory Visit (HOSPITAL_BASED_OUTPATIENT_CLINIC_OR_DEPARTMENT_OTHER)
Admission: RE | Admit: 2021-01-21 | Discharge: 2021-01-21 | Disposition: A | Discharge status: Home / Self Care | Payer: Medicare Other | Source: Ambulatory Visit | Attending: Physician Assistant | Admitting: Physician Assistant

## 2021-01-21 DIAGNOSIS — R7401 Elevation of levels of liver transaminase levels: Secondary | ICD-10-CM | POA: Insufficient documentation

## 2021-04-29 ENCOUNTER — Encounter: Payer: Self-pay | Admitting: Emergency Medicine

## 2021-04-29 ENCOUNTER — Ambulatory Visit
Admission: EM | Admit: 2021-04-29 | Discharge: 2021-04-29 | Disposition: A | Discharge status: Home / Self Care | Payer: Medicare Other | Attending: Family Medicine | Admitting: Family Medicine

## 2021-04-29 ENCOUNTER — Other Ambulatory Visit: Payer: Self-pay

## 2021-04-29 DIAGNOSIS — J3089 Other allergic rhinitis: Secondary | ICD-10-CM | POA: Diagnosis not present

## 2021-04-29 DIAGNOSIS — S39012A Strain of muscle, fascia and tendon of lower back, initial encounter: Secondary | ICD-10-CM | POA: Diagnosis not present

## 2021-04-29 MED ORDER — PREDNISONE 20 MG PO TABS: 40.0000 mg | ORAL_TABLET | Freq: Every day | ORAL | 0 refills | Status: DC

## 2021-04-29 MED ORDER — AZELASTINE HCL 0.1 % NA SOLN: 1.0000 | Freq: Two times a day (BID) | NASAL | 2 refills | Status: DC

## 2021-04-29 MED ORDER — CYCLOBENZAPRINE HCL 5 MG PO TABS: 5.0000 mg | ORAL_TABLET | Freq: Three times a day (TID) | ORAL | 0 refills | Status: DC | PRN

## 2021-04-29 NOTE — ED Triage Notes (Signed)
Pt reports nasal congestion, ear fullness, nausea x1 week. Pt reports "I get something like this every year" and reports lower back pain that radiates to left hip since last night. ? ? Pt reports to px arthritis medication last night when back began hurting and reports helped.  ? ?Pt also reports history of kidney stones and is currently being seen by urologist and denies any urinary frequency, dysuria at this time. ?

## 2021-05-03 NOTE — ED Provider Notes (Signed)
?[place] ? ? ? ?CSN: [id number] ?Arrival date & time: 04/29/21  0807 ? ? ?  ? ?History   ?Chief Complaint ?Chief Complaint  ?Patient presents with  ? Nasal Congestion  ? ? ?HPI ?Betty Gonzalez is a 71 y.o. female.  ? ?Presenting today with multiple complaints. States she has been having about 1 week of congestion, ear pressure and fullness, and nausea from post nasal drainage. Denies CP, SOB, abdominal pain, fever, chills, diarrhea. Has been trying OTC cold and congestion medication with minimal relief. Hx of allergic rhinitis on prn antihistamines. Also having low back aching b/l for the past day or so. No known injury, no saddle anesthesia, bowel or bladder incontinence, fever, chills, leg weakness, numbness, tingling. States hx of kidney stones but that this feels different. States it feels more like an arthritis flare.  ? ? ?Past Medical History:  ?Diagnosis Date  ? Chronic kidney disease   ? Diabetes mellitus without complication ([facility])   ? Hyperlipidemia   ? Lyme disease   ? ? ?Patient Active Problem List  ? Diagnosis Date Noted  ? 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 History of renal calculi 12/13/2017  ? 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 Statin intolerance 02/03/2017  ? Well controlled diabetes mellitus ([facility]) 02/01/2016  ? Actinic keratoses 06/26/2013  ? Acute sinusitis 06/26/2013  ? Adjustment disorder with anxiety 06/26/2013  ? Allergic rhinitis 06/26/2013  ? Dry mouth 06/26/2013  ? Fatigue 06/26/2013  ? Hyperglycemia 06/26/2013  ? Hyperlipemia 06/26/2013  ? Incomplete emptying of bladder 06/26/2013  ? Kidney stone 06/26/2013  ? 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 Osteoporosis 06/26/2013  ? Type 2 diabetes mellitus ([facility]) 06/26/2013  ? 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 ? ?Past Surgical History:  ?Procedure Laterality Date  ? NASAL SEPTUM SURGERY    ? ? ?OB History   ?No obstetric history on file. ?  ? ? ? ?Home Medications   ? ?Prior to Admission medications   ?Medication Sig  Start Date End Date Taking? Authorizing Provider  ?azelastine (ASTELIN) 0.1 % nasal spray Place 1 spray into both nostrils 2 (two) times daily. Use in each nostril as directed 04/29/21  Yes Volney American, PA-C  ?cyclobenzaprine (FLEXERIL) 5 MG tablet Take 1 tablet (5 mg total) by mouth 3 (three) times daily as needed for muscle spasms. Do not drink alcohol or drive while taking this medication, may cause drowsiness 04/29/21  Yes Volney American, PA-C  ?predniSONE (DELTASONE) 20 MG tablet Take 2 tablets (40 mg total) by mouth daily with breakfast. 04/29/21  Yes Volney American, PA-C  ?aspirin 81 MG chewable tablet Chew 81 mg by mouth daily.    [provider]  ?doxycycline (VIBRAMYCIN) 100 MG capsule Take 1 capsule (100 mg total) by mouth 2 (two) times daily. 07/21/20   Faustino Congress, NP  ?Echinacea 400 MG CAPS Take by mouth.    [provider]  ?escitalopram (LEXAPRO) 5 MG tablet Take 5 mg by mouth daily.    [provider]  ?fluticasone (FLONASE) 50 MCG/ACT nasal spray Place 2 sprays into both nostrils daily. 09/25/17   Tereasa Coop, PA-C  ?Glucosamine 500 MG CAPS Take by mouth.    [provider]  ?LORazepam (ATIVAN) 1 MG tablet Take 1 tablet (1 mg total) by mouth daily as needed for anxiety. No refills until 09/29/2017 09/25/17   Tereasa Coop, PA-C  ?metFORMIN (GLUCOPHAGE-XR) 500 MG 24 hr tablet TAKE  1 TABLET(500 MG) BY MOUTH DAILY WITH BREAKFAST 10/01/18   Collie Siad A, MD  ?omega-3 acid ethyl esters (LOVAZA) 1 g capsule Take 1 g by mouth 2 (two) times daily.    [provider]  ?traMADol (ULTRAM) 50 MG tablet Take 1 tablet (50 mg total) by mouth every 6 (six) hours as needed. 06/02/20   Moshe Cipro, NP  ?Zinc Sulfate (ZINC-220 PO) Take by mouth.    [provider]  ? ? ?Family History ?Family History  ?Problem Relation Age of Onset  ? Dementia Mother   ? Healthy Father   ? ? ?Social History ?Social History  ? ?Tobacco  Use  ? Smoking status: Former  ?  Types: Cigars, Cigarettes  ? Smokeless tobacco: Never  ? Tobacco comments:  ?  1 cigar occasion  ?Vaping Use  ? Vaping Use: Never used  ?Substance Use Topics  ? Alcohol use: No  ?  Alcohol/week: 0.0 standard drinks  ? Drug use: Yes  ?  Types: Marijuana  ? ? ? ?Allergies   ?Codeine, Other, Sulfonamide derivatives, Pedi-pre tape spray [wound dressing adhesive], and Tape ? ? ?Review of Systems ?Review of Systems ?PER HPI ? ?Physical Exam ?Triage Vital Signs ?ED Triage Vitals [04/29/21 0840]  ?Enc Vitals Group  ?   BP (!) 143/82  ?   Pulse Rate 70  ?   Resp 18  ?   Temp 97.7 ?F (36.5 ?C)  ?   Temp Source Oral  ?   SpO2 95 %  ?   Weight 167 lb (75.8 kg)  ?   Height 5\' 8"  (1.727 m)  ?   Head Circumference   ?   Peak Flow   ?   Pain Score 4  ?   Pain Loc   ?   Pain Edu?   ?   Excl. in GC?   ? ?No data found. ? ?Updated Vital Signs ?BP (!) 143/82 (BP Location: Right Arm)   Pulse 70   Temp 97.7 ?F (36.5 ?C) (Oral)   Resp 18   Ht 5\' 8"  (1.727 m)   Wt 167 lb (75.8 kg)   SpO2 95%   BMI 25.39 kg/m?  ? ?Visual Acuity ?Right Eye Distance:   ?Left Eye Distance:   ?Bilateral Distance:   ? ?Right Eye Near:   ?Left Eye Near:    ?Bilateral Near:    ? ?Physical Exam ?Vitals and nursing note reviewed.  ?Constitutional:   ?   Appearance: Normal appearance. She is not ill-appearing.  ?HENT:  ?   Head: Atraumatic.  ?   Right Ear: Tympanic membrane normal.  ?   Left Ear: Tympanic membrane normal.  ?   Nose: Rhinorrhea present.  ?   Mouth/Throat:  ?   Mouth: Mucous membranes are moist.  ?   Pharynx: Oropharynx is clear.  ?Eyes:  ?   Extraocular Movements: Extraocular movements intact.  ?   Conjunctiva/sclera: Conjunctivae normal.  ?Cardiovascular:  ?   Rate and Rhythm: Normal rate and regular rhythm.  ?   Heart sounds: Normal heart sounds.  ?Pulmonary:  ?   Effort: Pulmonary effort is normal.  ?   Breath sounds: Normal breath sounds. No wheezing or rales.  ?Musculoskeletal:     ?   General: Tenderness  present. No swelling, deformity or signs of injury. Normal range of motion.  ?   Cervical back: Normal range of motion and neck supple.  ?   Comments: B/l lumbar paraspinal  muscles ttp and mild spasm. No midline spinal ttp diffusely. Neg SLR b/l  ?Skin: ?   General: Skin is warm and dry.  ?Neurological:  ?   Mental Status: She is alert and oriented to person, place, and time.  ?   Comments: B/l LEs neurovascularly intact.   ?Psychiatric:     ?   Mood and Affect: Mood normal.     ?   Thought Content: Thought content normal.     ?   Judgment: Judgment normal.  ? ? ? ?UC Treatments / Results  ?Labs ?(all labs ordered are listed, but only abnormal results are displayed) ?Labs Reviewed - No data to display ? ?EKG ? ? ?Radiology ?No results found. ? ?Procedures ?Procedures (including critical care time) ? ?Medications Ordered in UC ?Medications - No data to display ? ?Initial Impression / Assessment and Plan / UC Course  ?I have reviewed the triage vital signs and the nursing notes. ? ?Pertinent labs & imaging results that were available during my care of the patient were reviewed by me and considered in my medical decision making (see chart for details). ? ?  ? ?Suspect allergy exacerbation and lumbar strain. Treat with prednisone, astelin nasal spray in additino to daily antihistamine, and flexeril. Discussed supportive care and return precautions.  ? ?Final Clinical Impressions(s) / UC Diagnoses  ? ?Final diagnoses:  ?Seasonal allergic rhinitis due to other allergic trigger  ?Strain of lumbar region, initial encounter  ? ?Discharge Instructions   ?None ?  ? ?ED Prescriptions   ? ? Medication Sig Dispense Auth. Provider  ? predniSONE (DELTASONE) 20 MG tablet Take 2 tablets (40 mg total) by mouth daily with breakfast. 10 tablet Volney American, PA-C  ? cyclobenzaprine (FLEXERIL) 5 MG tablet Take 1 tablet (5 mg total) by mouth 3 (three) times daily as needed for muscle spasms. Do not drink alcohol or drive while  taking this medication, may cause drowsiness 15 tablet Volney American, PA-C  ? azelastine (ASTELIN) 0.1 % nasal spray Place 1 spray into both nostrils 2 (two) times daily. Use in each nostril as directe

## 2022-03-21 ENCOUNTER — Other Ambulatory Visit: Payer: Self-pay | Admitting: Obstetrics and Gynecology

## 2022-03-21 DIAGNOSIS — R928 Other abnormal and inconclusive findings on diagnostic imaging of breast: Secondary | ICD-10-CM

## 2022-04-05 ENCOUNTER — Ambulatory Visit: Payer: Medicare Other

## 2022-04-05 ENCOUNTER — Ambulatory Visit
Admission: RE | Admit: 2022-04-05 | Discharge: 2022-04-05 | Disposition: A | Discharge status: Home / Self Care | Payer: Medicare Other | Source: Ambulatory Visit | Attending: Obstetrics and Gynecology | Admitting: Obstetrics and Gynecology

## 2022-04-05 DIAGNOSIS — R928 Other abnormal and inconclusive findings on diagnostic imaging of breast: Secondary | ICD-10-CM

## 2022-04-14 ENCOUNTER — Encounter: Payer: Self-pay | Admitting: Radiology

## 2022-05-20 ENCOUNTER — Encounter: Payer: Self-pay | Admitting: Emergency Medicine

## 2022-05-20 ENCOUNTER — Ambulatory Visit
Admission: EM | Admit: 2022-05-20 | Discharge: 2022-05-20 | Disposition: A | Discharge status: Home / Self Care | Payer: Medicare Other | Attending: Physician Assistant | Admitting: Physician Assistant

## 2022-05-20 DIAGNOSIS — J40 Bronchitis, not specified as acute or chronic: Secondary | ICD-10-CM

## 2022-05-20 DIAGNOSIS — J329 Chronic sinusitis, unspecified: Secondary | ICD-10-CM | POA: Diagnosis not present

## 2022-05-20 MED ORDER — DOXYCYCLINE HYCLATE 100 MG PO CAPS: 100.0000 mg | ORAL_CAPSULE | Freq: Two times a day (BID) | ORAL | 0 refills | Status: DC

## 2022-05-20 NOTE — Discharge Instructions (Signed)
Start doxycycline 100 mg twice daily for 10 days.  Stay out of the sun while on this medication as it can cause a very bad sunburn.  Use over-the-counter medications including Mucinex, Flonase, Tylenol.  Make sure that you are resting and drinking plenty of fluid.  I also recommended nasal saline/sinus rinse.  If your symptoms do not improving within a few days please return for reevaluation.  If anything worsens and you have fever, worsening cough, shortness of breath, nausea/vomiting, weakness you should be seen immediately.

## 2022-05-20 NOTE — ED Provider Notes (Signed)
[facility]    CSN: [id number] Arrival date & time: 05/20/22  1413      History   Chief Complaint No chief complaint on file.   HPI Betty Gonzalez is a 72 y.o. female.   Patient presents today with a week plus long history of URI symptoms.  She reports nasal congestion, sinus pressure, cough.  Reports that she generally gets similar symptoms once per year.  She contacted her provider when symptoms began and was encouraged to use over-the-counter cold and flu medicine as well as allergy medicine.  She has been using this regularly without improvement of symptoms.  Denies any recent antibiotics or steroids.  Denies any known sick contacts.  She denies any history of allergies, asthma, COPD, smoking.  She has had COVID in the past with last episode several years ago.  She denies any associated shortness of breath, fever, nausea, vomiting.    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   History of renal calculi 12/13/2017   Tick-borne disease 09/18/2017   Statin intolerance 02/03/2017   Well controlled diabetes mellitus 02/01/2016   Actinic keratoses 06/26/2013   Acute sinusitis 06/26/2013   Adjustment disorder with anxiety 06/26/2013   Allergic rhinitis 06/26/2013   Dry mouth 06/26/2013   Fatigue 06/26/2013   Hyperglycemia 06/26/2013   Hyperlipemia 06/26/2013   Incomplete emptying of bladder 06/26/2013   Kidney stone 06/26/2013   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      Home Medications    Prior to Admission medications    Medication Sig Start Date End Date Taking? Authorizing Provider  doxycycline (VIBRAMYCIN) 100 MG capsule Take 1 capsule (100 mg total) by mouth 2 (two) times daily. 05/20/22  Yes Cniyah Sproull, Noberto RetortErin K, PA-C  aspirin 81 MG chewable tablet Chew 81 mg by mouth daily.    [provider]  azelastine (ASTELIN) 0.1 % nasal spray Place 1 spray into both nostrils 2 (two) times daily. Use in each nostril as directed 04/29/21   Particia NearingLane, Rachel Elizabeth, PA-C  cyclobenzaprine (FLEXERIL) 5 MG tablet Take 1 tablet (5 mg total) by mouth 3 (three) times daily as needed for muscle spasms. Do not drink alcohol or drive while taking this medication, may cause drowsiness 04/29/21   Particia NearingLane, Rachel Elizabeth, PA-C  Echinacea 400 MG CAPS Take by mouth.    [provider]  escitalopram (LEXAPRO) 5 MG tablet Take 5 mg by mouth daily.    [provider]  fluticasone (FLONASE) 50 MCG/ACT nasal spray Place 2 sprays into both nostrils daily. 09/25/17   Ofilia Neaslark, Michael L, PA-C  Glucosamine 500 MG CAPS Take by mouth.    [provider]  LORazepam (ATIVAN) 1 MG tablet Take 1 tablet (1 mg total) by mouth daily as needed for anxiety. No refills until 09/29/2017 09/25/17   Ofilia Neaslark, Michael L, PA-C  metFORMIN (GLUCOPHAGE-XR) 500 MG 24 hr tablet TAKE 1 TABLET(500 MG) BY MOUTH DAILY WITH BREAKFAST 10/01/18   Doristine BosworthStallings, Zoe A, MD  omega-3 acid ethyl esters (LOVAZA) 1 g capsule Take 1 g by mouth 2 (two) times daily.    [provider]  traMADol (ULTRAM) 50 MG tablet Take 1 tablet (50 mg total) by mouth every 6 (six) hours as needed. 06/02/20   Moshe Cipro, NP  Zinc Sulfate (ZINC-220 PO) Take by mouth.    [provider]    Family History Family History  Problem Relation Age of Onset   Dementia Mother    Healthy Father     Social History Social History   Tobacco Use   Smoking status: Former    Types: Cigars, Cigarettes   Smokeless tobacco: Never   Tobacco comments:    1 cigar occasion   Vaping Use   Vaping Use: Never used  Substance Use Topics   Alcohol use: No    Alcohol/week: 0.0 standard drinks of alcohol   Drug use: Yes    Types: Marijuana     Allergies   Codeine, Other, Sulfonamide derivatives, Pedi-pre tape spray [wound dressing adhesive], Penicillins, and Tape   Review of Systems Review of Systems  Constitutional:  Positive for activity change. Negative for appetite change, fatigue and fever.  HENT:  Positive for congestion, postnasal drip, sinus pressure and sore throat. Negative for sneezing.   Respiratory:  Positive for cough. Negative for shortness of breath.   Cardiovascular:  Negative for chest pain.  Gastrointestinal:  Negative for abdominal pain, diarrhea, nausea and vomiting.  Neurological:  Negative for dizziness, light-headedness and headaches.     Physical Exam Triage Vital Signs ED Triage Vitals  Enc Vitals Group     BP 05/20/22 1517 126/65     Pulse Rate 05/20/22 1517 67     Resp 05/20/22 1517 16     Temp 05/20/22 1517 98 F (36.7 C)     Temp Source 05/20/22 1517 Oral     SpO2 05/20/22 1517 93 %     Weight --      Height --      Head Circumference --      Peak Flow --      Pain Score 05/20/22 1521 5     Pain Loc --      Pain Edu? --      Excl. in GC? --    No data found.  Updated Vital Signs BP 126/65 (BP Location: Right Arm)   Pulse 67   Temp 98 F (36.7 C) (Oral)   Resp 16   SpO2 93%   Visual Acuity Right Eye Distance:   Left Eye Distance:   Bilateral Distance:    Right Eye Near:   Left Eye Near:    Bilateral Near:     Physical Exam Vitals reviewed.  Constitutional:      General: She is awake. She is not in acute distress.    Appearance: Normal appearance. She is well-developed. She is not ill-appearing.     Comments: Very pleasant female appears stated age in no acute distress sitting comfortably in exam room  HENT:     Head: Normocephalic and atraumatic.     Right Ear: Tympanic membrane, ear canal and  external ear normal. Tympanic membrane is not erythematous or bulging.     Left Ear: Tympanic membrane, ear canal and external ear normal. Tympanic membrane is not erythematous or bulging.     Nose:     Right Sinus: Maxillary sinus tenderness and frontal sinus tenderness present.     Left Sinus: Maxillary sinus tenderness and  frontal sinus tenderness present.     Mouth/Throat:     Pharynx: Uvula midline. Posterior oropharyngeal erythema present. No oropharyngeal exudate.     Comments: Erythema and drainage in posterior oropharynx Cardiovascular:     Rate and Rhythm: Normal rate and regular rhythm.     Heart sounds: S1 normal and S2 normal. Murmur heard.  Pulmonary:     Effort: Pulmonary effort is normal.     Breath sounds: Normal breath sounds. No wheezing, rhonchi or rales.     Comments: Clear to auscultation bilaterally Psychiatric:        Behavior: Behavior is cooperative.      UC Treatments / Results  Labs (all labs ordered are listed, but only abnormal results are displayed) Labs Reviewed - No data to display  EKG   Radiology No results found.  Procedures Procedures (including critical care time)  Medications Ordered in UC Medications - No data to display  Initial Impression / Assessment and Plan / UC Course  I have reviewed the triage vital signs and the nursing notes.  Pertinent labs & imaging results that were available during my care of the patient were reviewed by me and considered in my medical decision making (see chart for details).     Patient is well-appearing, afebrile, nontoxic, nontachycardic.  Given prolonged and recent double worsening of symptoms will cover for secondary bacterial infection with doxycycline 100 mg twice daily for 10 days.  She is to avoid prolonged sun exposure while on this medication due to associated photosensitivity.  Recommend she continue over-the-counter medication including Mucinex, Flonase, Tylenol.  Recommend she continue  previously prescribed allergy medication.  She is to use nasal saline and sinus rinses as well as pushing fluids to help manage her symptoms.  Discussed that if she has any worsening or changing symptoms including worsening cough, shortness of breath, weakness, nausea, vomiting she should be seen immediately.  Strict return precautions given to which she expressed understanding.  Murmur was heard on exam.  Patient reports that she is aware of this murmur and recommended that she follow-up with her PCP regarding further evaluation.  Final Clinical Impressions(s) / UC Diagnoses   Final diagnoses:  Sinobronchitis     Discharge Instructions      Start doxycycline 100 mg twice daily for 10 days.  Stay out of the sun while on this medication as it can cause a very bad sunburn.  Use over-the-counter medications including Mucinex, Flonase, Tylenol.  Make sure that you are resting and drinking plenty of fluid.  I also recommended nasal saline/sinus rinse.  If your symptoms do not improving within a few days please return for reevaluation.  If anything worsens and you have fever, worsening cough, shortness of breath, nausea/vomiting, weakness you should be seen immediately.     ED Prescriptions     Medication Sig Dispense Auth. Provider   doxycycline (VIBRAMYCIN) 100 MG capsule Take 1 capsule (100 mg total) by mouth 2 (two) times daily. 20 capsule Michi Herrmann, Noberto RetortErin K, PA-C      PDMP not reviewed this encounter.   Jeani HawkingRaspet, Poppi Scantling K, PA-C 05/20/22 [id number]

## 2022-05-20 NOTE — ED Triage Notes (Signed)
Cough and nasal congestion x 1 week.  Has been taking coricidin and nasal spray with little relief.  States cough is productive at times.

## 2023-02-19 IMAGING — US US ABDOMEN LIMITED
1 series · 14 of 25 positions shown · non-contrast
Comparison: None.

CLINICAL DATA: Abnormal liver function tests

EXAM:
ULTRASOUND ABDOMEN LIMITED RIGHT UPPER QUADRANT

[Series 1: us abdomen limited ruq (liver/gb) · 14 of 42 slices shown]
[im 1/42]
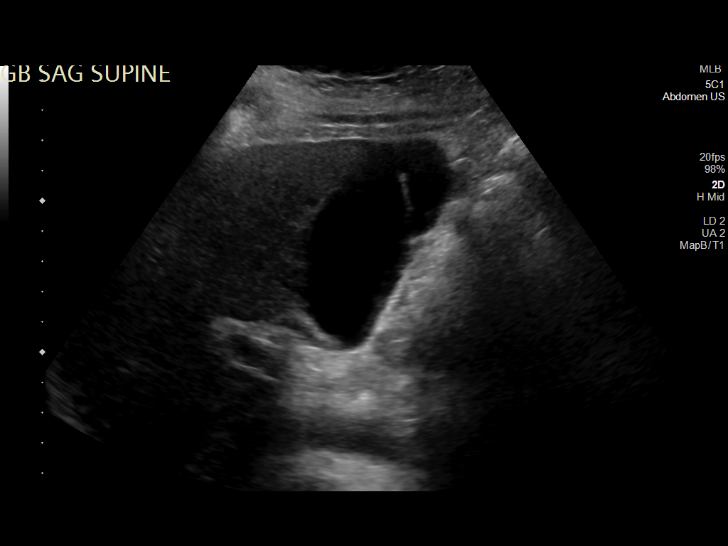
[im 4/42]
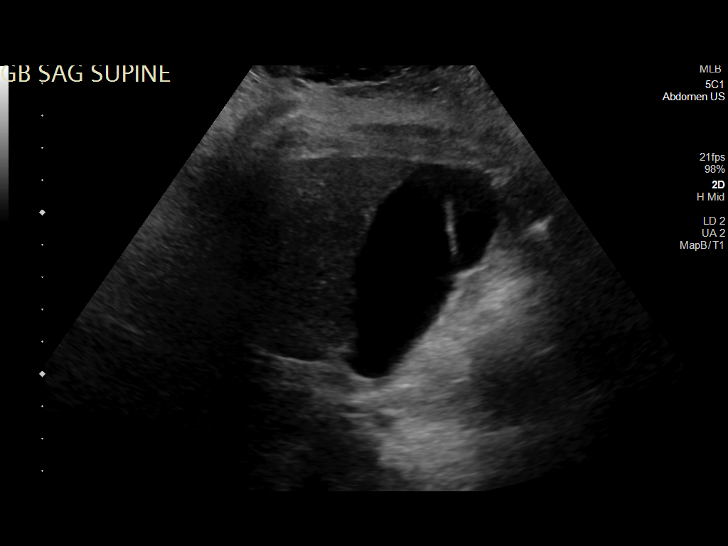
[im 7/42]
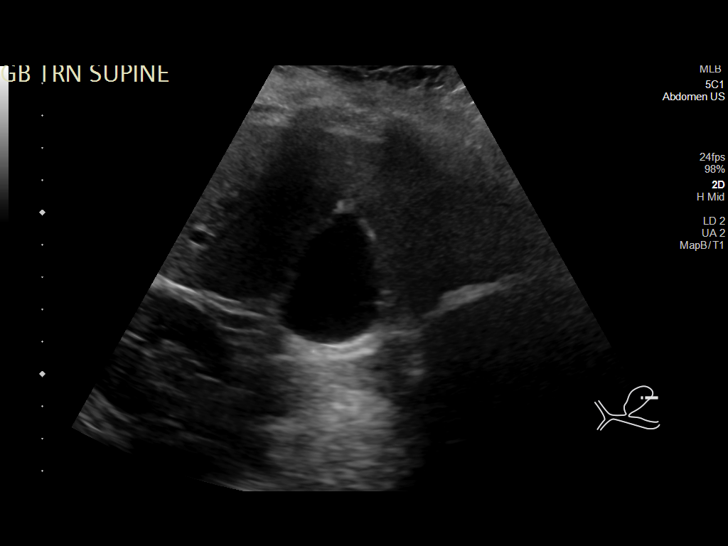
[im 11/42]
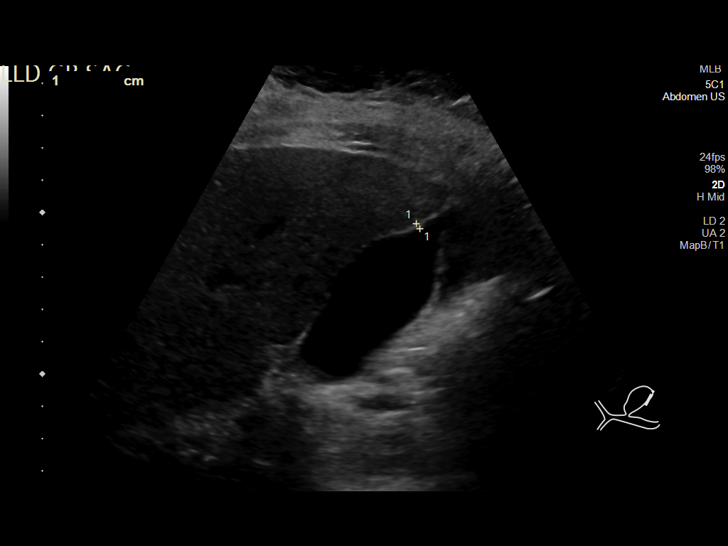
[im 14/42]
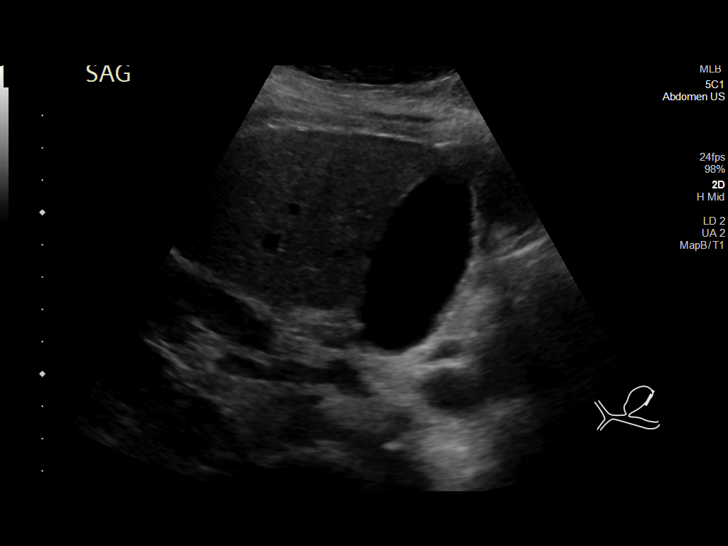
[im 16/42]
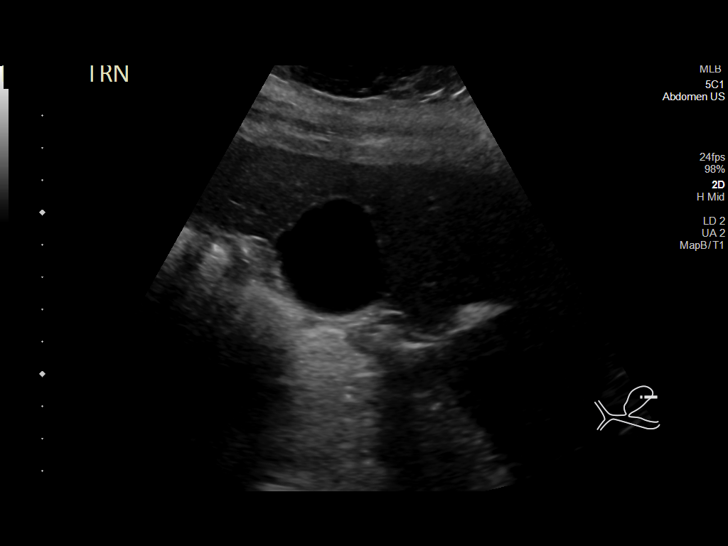
[im 19/42]
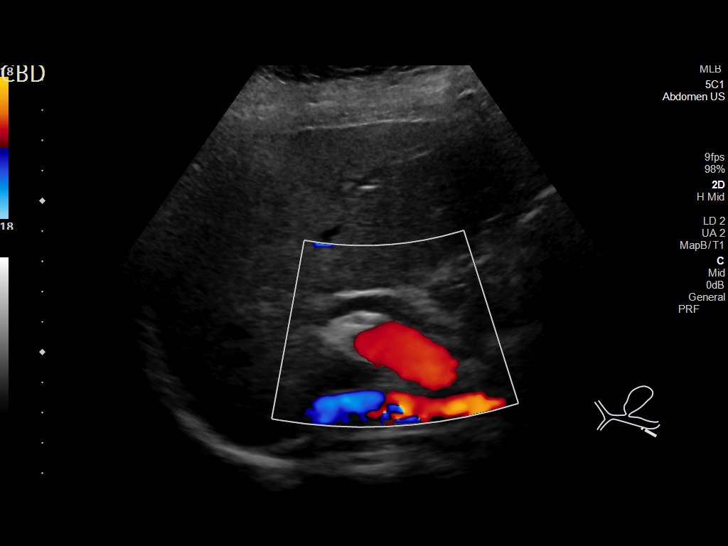
[im 23/42]
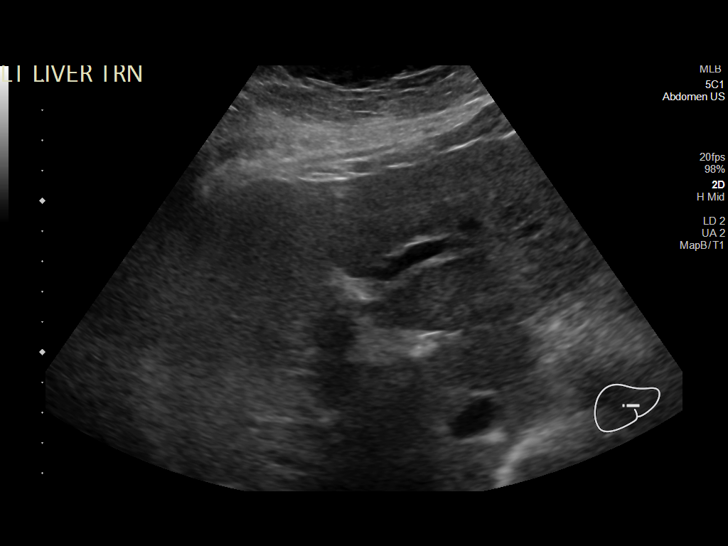
[im 26/42]
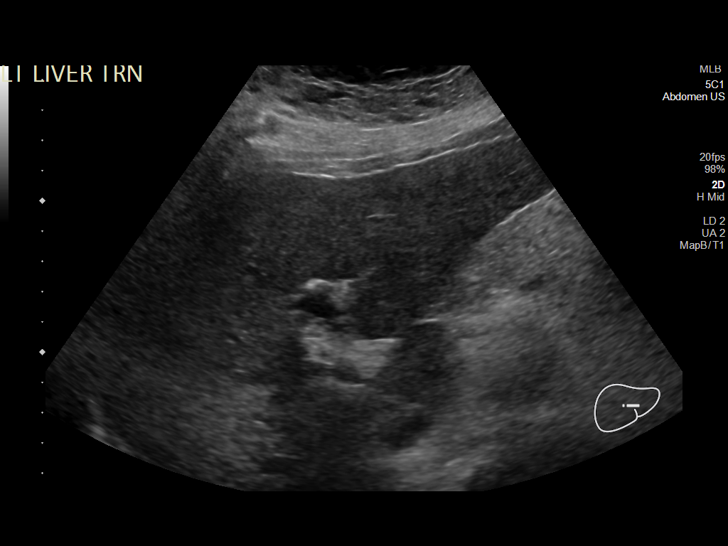
[im 28/42]
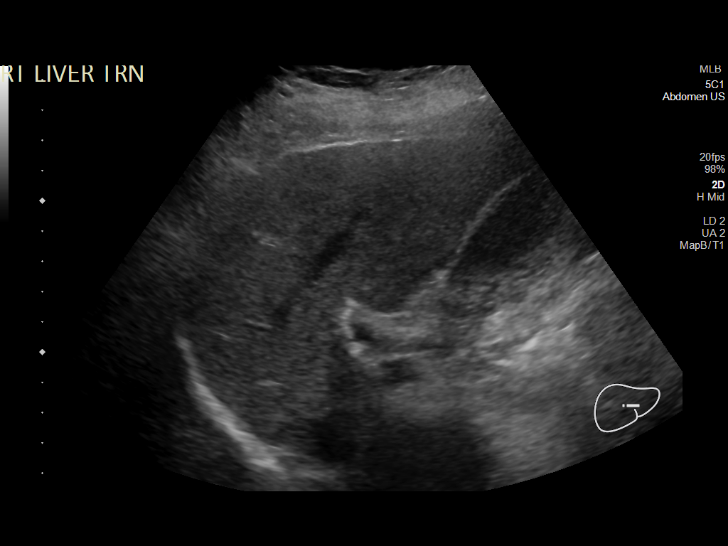
[im 31/42]
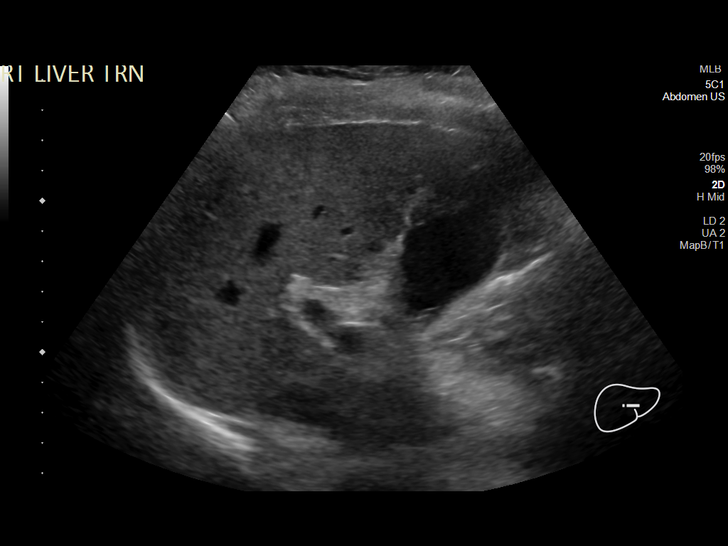
[im 35/42]
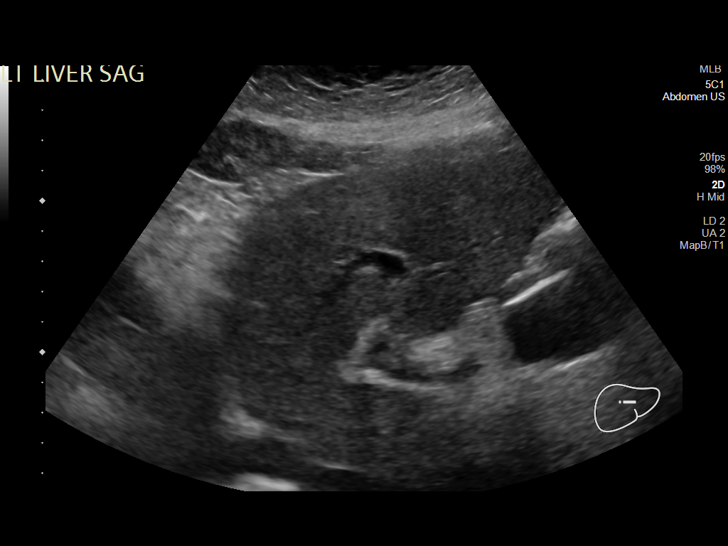
[im 38/42]
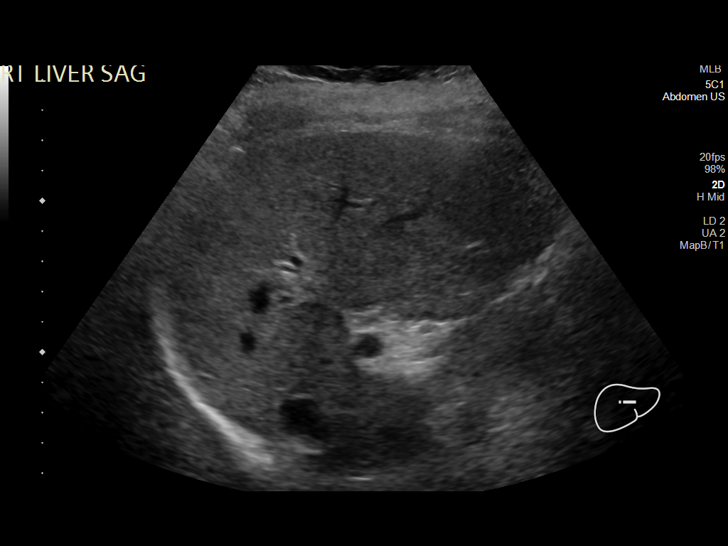
[im 42/42]
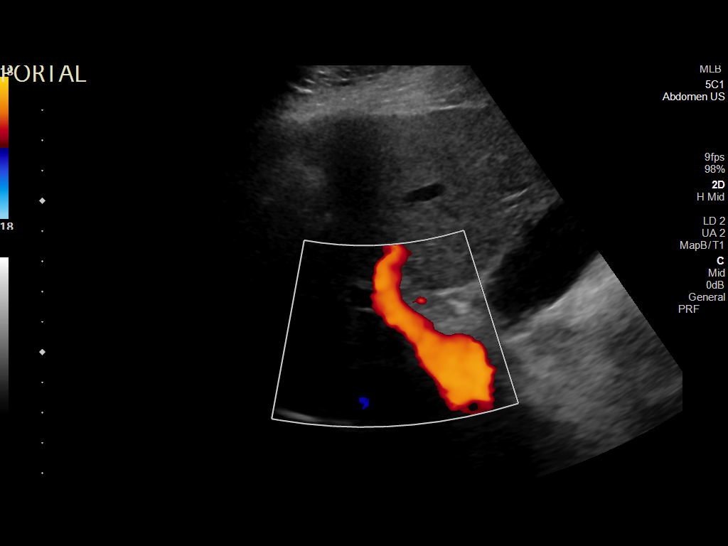

[14 of 25 positions shown; findings below may reference images not displayed]

FINDINGS: Gallbladder:

No gallstones or wall thickening visualized. No sonographic Murphy
sign noted by sonographer.

Common bile duct:

Diameter: 6.7 mm. Distal common bile duct is not adequately
visualized for evaluation.

Liver:

No focal lesion identified. Within normal limits in parenchymal
echogenicity. Portal vein is patent on color Doppler imaging with
normal direction of blood flow towards the liver.

Other: None.
IMPRESSION: No significant sonographic abnormality is seen in the right upper
quadrant.

## 2023-04-01 ENCOUNTER — Inpatient Hospital Stay (HOSPITAL_COMMUNITY): Payer: Medicare Other | Admitting: Anesthesiology

## 2023-04-01 ENCOUNTER — Encounter (HOSPITAL_COMMUNITY): Payer: Self-pay | Admitting: Emergency Medicine

## 2023-04-01 ENCOUNTER — Inpatient Hospital Stay (HOSPITAL_COMMUNITY)
Admission: EM | Admit: 2023-04-01 | Discharge: 2023-04-04 | DRG: 432 | Disposition: A | Discharge status: Home / Self Care | Payer: Medicare Other | Attending: Family Medicine | Admitting: Family Medicine

## 2023-04-01 ENCOUNTER — Other Ambulatory Visit: Payer: Self-pay

## 2023-04-01 ENCOUNTER — Encounter (HOSPITAL_COMMUNITY)
Admission: EM | Disposition: A | Discharge status: Home / Self Care | Payer: Self-pay | Source: Home / Self Care | Attending: Family Medicine

## 2023-04-01 ENCOUNTER — Inpatient Hospital Stay (HOSPITAL_COMMUNITY): Payer: Medicare Other

## 2023-04-01 DIAGNOSIS — Z91048 Other nonmedicinal substance allergy status: Secondary | ICD-10-CM

## 2023-04-01 DIAGNOSIS — Q6432 Congenital stricture of urethra: Secondary | ICD-10-CM | POA: Diagnosis not present

## 2023-04-01 DIAGNOSIS — Z882 Allergy status to sulfonamides status: Secondary | ICD-10-CM

## 2023-04-01 DIAGNOSIS — Z87891 Personal history of nicotine dependence: Secondary | ICD-10-CM

## 2023-04-01 DIAGNOSIS — D62 Acute posthemorrhagic anemia: Secondary | ICD-10-CM | POA: Diagnosis present

## 2023-04-01 DIAGNOSIS — I8501 Esophageal varices with bleeding: Secondary | ICD-10-CM | POA: Diagnosis present

## 2023-04-01 DIAGNOSIS — H8102 Meniere's disease, left ear: Secondary | ICD-10-CM | POA: Diagnosis present

## 2023-04-01 DIAGNOSIS — K7290 Hepatic failure, unspecified without coma: Secondary | ICD-10-CM

## 2023-04-01 DIAGNOSIS — E119 Type 2 diabetes mellitus without complications: Secondary | ICD-10-CM

## 2023-04-01 DIAGNOSIS — I8511 Secondary esophageal varices with bleeding: Secondary | ICD-10-CM | POA: Diagnosis present

## 2023-04-01 DIAGNOSIS — Z880 Allergy status to penicillin: Secondary | ICD-10-CM | POA: Diagnosis not present

## 2023-04-01 DIAGNOSIS — Z885 Allergy status to narcotic agent status: Secondary | ICD-10-CM

## 2023-04-01 DIAGNOSIS — K920 Hematemesis: Secondary | ICD-10-CM | POA: Diagnosis present

## 2023-04-01 DIAGNOSIS — E1122 Type 2 diabetes mellitus with diabetic chronic kidney disease: Secondary | ICD-10-CM | POA: Diagnosis present

## 2023-04-01 DIAGNOSIS — Z7984 Long term (current) use of oral hypoglycemic drugs: Secondary | ICD-10-CM

## 2023-04-01 DIAGNOSIS — D696 Thrombocytopenia, unspecified: Secondary | ICD-10-CM | POA: Diagnosis present

## 2023-04-01 DIAGNOSIS — K922 Gastrointestinal hemorrhage, unspecified: Secondary | ICD-10-CM | POA: Diagnosis not present

## 2023-04-01 DIAGNOSIS — K5900 Constipation, unspecified: Secondary | ICD-10-CM | POA: Diagnosis present

## 2023-04-01 DIAGNOSIS — Z7982 Long term (current) use of aspirin: Secondary | ICD-10-CM | POA: Diagnosis not present

## 2023-04-01 DIAGNOSIS — Z8619 Personal history of other infectious and parasitic diseases: Secondary | ICD-10-CM | POA: Diagnosis not present

## 2023-04-01 DIAGNOSIS — Z79899 Other long term (current) drug therapy: Secondary | ICD-10-CM | POA: Diagnosis not present

## 2023-04-01 DIAGNOSIS — K7469 Other cirrhosis of liver: Principal | ICD-10-CM | POA: Diagnosis present

## 2023-04-01 DIAGNOSIS — K7460 Unspecified cirrhosis of liver: Secondary | ICD-10-CM | POA: Diagnosis not present

## 2023-04-01 DIAGNOSIS — Z789 Other specified health status: Secondary | ICD-10-CM | POA: Diagnosis present

## 2023-04-01 DIAGNOSIS — R933 Abnormal findings on diagnostic imaging of other parts of digestive tract: Secondary | ICD-10-CM | POA: Diagnosis not present

## 2023-04-01 DIAGNOSIS — E785 Hyperlipidemia, unspecified: Secondary | ICD-10-CM | POA: Diagnosis present

## 2023-04-01 DIAGNOSIS — J309 Allergic rhinitis, unspecified: Secondary | ICD-10-CM | POA: Diagnosis present

## 2023-04-01 HISTORY — PX: ESOPHAGOGASTRODUODENOSCOPY (EGD) WITH PROPOFOL: SHX5813

## 2023-04-01 HISTORY — PX: ESOPHAGEAL BANDING: SHX5518

## 2023-04-01 LAB — ABO/RH: ABO/RH(D): A POS

## 2023-04-01 LAB — CBC
HCT: 35.1 % — ABNORMAL LOW (ref 36.0–46.0)
HCT: 30.3 % — ABNORMAL LOW (ref 36.0–46.0)
HCT: 30.3 % — ABNORMAL LOW (ref 36.0–46.0)
Hemoglobin: 11.3 g/dL — ABNORMAL LOW (ref 12.0–15.0)
Hemoglobin: 10.2 g/dL — ABNORMAL LOW (ref 12.0–15.0)
Hemoglobin: 9.7 g/dL — ABNORMAL LOW (ref 12.0–15.0)
MCH: 31.2 pg (ref 26.0–34.0)
MCH: 32.5 pg (ref 26.0–34.0)
MCH: 31.5 pg (ref 26.0–34.0)
MCHC: 32.2 g/dL (ref 30.0–36.0)
MCHC: 33.7 g/dL (ref 30.0–36.0)
MCHC: 32 g/dL (ref 30.0–36.0)
MCV: 97 fL (ref 80.0–100.0)
MCV: 96.5 fL (ref 80.0–100.0)
MCV: 98.4 fL (ref 80.0–100.0)
Platelets: 176 10*3/uL (ref 150–400)
Platelets: 142 10*3/uL — ABNORMAL LOW (ref 150–400)
Platelets: 138 10*3/uL — ABNORMAL LOW (ref 150–400)
RBC: 3.62 MIL/uL — ABNORMAL LOW (ref 3.87–5.11)
RBC: 3.14 MIL/uL — ABNORMAL LOW (ref 3.87–5.11)
RBC: 3.08 MIL/uL — ABNORMAL LOW (ref 3.87–5.11)
RDW: 13.6 % (ref 11.5–15.5)
RDW: 13.9 % (ref 11.5–15.5)
RDW: 14 % (ref 11.5–15.5)
WBC: 9.3 10*3/uL (ref 4.0–10.5)
WBC: 8.8 10*3/uL (ref 4.0–10.5)
WBC: 9.8 10*3/uL (ref 4.0–10.5)
nRBC: 0 % (ref 0.0–0.2)
nRBC: 0 % (ref 0.0–0.2)
nRBC: 0 % (ref 0.0–0.2)

## 2023-04-01 LAB — COMPREHENSIVE METABOLIC PANEL
ALT: 24 U/L (ref 0–44)
AST: 28 U/L (ref 15–41)
Albumin: 3.2 g/dL — ABNORMAL LOW (ref 3.5–5.0)
Alkaline Phosphatase: 105 U/L (ref 38–126)
Anion gap: 10 (ref 5–15)
BUN: 39 mg/dL — ABNORMAL HIGH (ref 8–23)
CO2: 24 mmol/L (ref 22–32)
Calcium: 8.4 mg/dL — ABNORMAL LOW (ref 8.9–10.3)
Chloride: 105 mmol/L (ref 98–111)
Creatinine, Ser: 0.76 mg/dL (ref 0.44–1.00)
GFR, Estimated: >60 mL/min (ref >60)
Glucose, Bld: 209 mg/dL — ABNORMAL HIGH (ref 70–99)
Potassium: 3.5 mmol/L (ref 3.5–5.1)
Sodium: 139 mmol/L (ref 135–145)
Total Bilirubin: 1.7 mg/dL — ABNORMAL HIGH (ref 0.0–1.2)
Total Protein: 6.7 g/dL (ref 6.5–8.1)

## 2023-04-01 LAB — CBG MONITORING, ED
Glucose-Capillary: 183 mg/dL — ABNORMAL HIGH (ref 70–99)
Glucose-Capillary: 173 mg/dL — ABNORMAL HIGH (ref 70–99)

## 2023-04-01 LAB — POC OCCULT BLOOD, ED: Result: POSITIVE

## 2023-04-01 LAB — LIPASE, BLOOD: Lipase: 34 U/L (ref 11–51)

## 2023-04-01 LAB — PROTIME-INR
INR: 1.2 (ref 0.8–1.2)
Prothrombin Time: 15.7 s — ABNORMAL HIGH (ref 11.4–15.2)

## 2023-04-01 LAB — MRSA NEXT GEN BY PCR, NASAL: MRSA by PCR Next Gen: NOT DETECTED

## 2023-04-01 LAB — GLUCOSE, CAPILLARY
Glucose-Capillary: 147 mg/dL — ABNORMAL HIGH (ref 70–99)
Glucose-Capillary: 142 mg/dL — ABNORMAL HIGH (ref 70–99)

## 2023-04-01 LAB — TYPE AND SCREEN
ABO/RH(D): A POS
Antibody Screen: NEGATIVE

## 2023-04-01 SURGERY — ESOPHAGOGASTRODUODENOSCOPY (EGD) WITH PROPOFOL
Anesthesia: General

## 2023-04-01 MED ORDER — LACTATED RINGERS IV BOLUS
1000.0000 mL | Freq: Once | INTRAVENOUS | Status: AC
  Administered 2023-04-01: 1000 mL via INTRAVENOUS

## 2023-04-01 MED ORDER — ALBUMIN HUMAN 5 % IV SOLN
INTRAVENOUS | Status: AC
  Filled 2023-04-01: qty 250

## 2023-04-01 MED ORDER — FENTANYL CITRATE PF 50 MCG/ML IJ SOSY
12.5000 ug | PREFILLED_SYRINGE | INTRAMUSCULAR | Status: DC | PRN
  Administered 2023-04-01 (×2): 25 ug via INTRAVENOUS
  Filled 2023-04-01 (×2): qty 1

## 2023-04-01 MED ORDER — PROPOFOL 10 MG/ML IV BOLUS
INTRAVENOUS | Status: AC
  Filled 2023-04-01: qty 20

## 2023-04-01 MED ORDER — ONDANSETRON HCL 4 MG PO TABS: 4.0000 mg | ORAL_TABLET | Freq: Four times a day (QID) | ORAL | Status: DC | PRN

## 2023-04-01 MED ORDER — PANTOPRAZOLE SODIUM 40 MG IV SOLR
40.0000 mg | INTRAVENOUS | Status: AC
  Administered 2023-04-01 (×2): 40 mg via INTRAVENOUS
  Filled 2023-04-01 (×2): qty 10

## 2023-04-01 MED ORDER — ONDANSETRON HCL 4 MG/2ML IJ SOLN: 4.0000 mg | Freq: Once | INTRAMUSCULAR | Status: AC

## 2023-04-01 MED ORDER — INSULIN ASPART 100 UNIT/ML IJ SOLN
0.0000 [IU] | Freq: Three times a day (TID) | INTRAMUSCULAR | Status: DC
  Administered 2023-04-01 – 2023-04-04 (×2): 1 [IU] via SUBCUTANEOUS
  Filled 2023-04-01: qty 1

## 2023-04-01 MED ORDER — METOCLOPRAMIDE HCL 5 MG/ML IJ SOLN
10.0000 mg | Freq: Once | INTRAMUSCULAR | Status: AC
  Administered 2023-04-01: 10 mg via INTRAVENOUS

## 2023-04-01 MED ORDER — CHLORHEXIDINE GLUCONATE CLOTH 2 % EX PADS
6.0000 | MEDICATED_PAD | Freq: Every day | CUTANEOUS | Status: DC
  Administered 2023-04-01 – 2023-04-02 (×2): 6 via TOPICAL

## 2023-04-01 MED ORDER — PANTOPRAZOLE SODIUM 40 MG IV SOLR
40.0000 mg | Freq: Two times a day (BID) | INTRAVENOUS | Status: DC
  Administered 2023-04-01 – 2023-04-03 (×5): 40 mg via INTRAVENOUS
  Filled 2023-04-01 (×5): qty 10

## 2023-04-01 MED ORDER — PROPOFOL 500 MG/50ML IV EMUL
INTRAVENOUS | Status: DC | PRN
  Administered 2023-04-01: 160 ug/kg/min via INTRAVENOUS

## 2023-04-01 MED ORDER — LIDOCAINE HCL (PF) 2 % IJ SOLN
INTRAMUSCULAR | Status: AC
  Filled 2023-04-01: qty 5

## 2023-04-01 MED ORDER — ONDANSETRON HCL 4 MG/2ML IJ SOLN
4.0000 mg | Freq: Four times a day (QID) | INTRAMUSCULAR | Status: DC | PRN
  Administered 2023-04-01: 4 mg via INTRAVENOUS

## 2023-04-01 MED ORDER — PROCHLORPERAZINE EDISYLATE 10 MG/2ML IJ SOLN
10.0000 mg | INTRAMUSCULAR | Status: DC | PRN
  Administered 2023-04-01: 10 mg via INTRAVENOUS
  Filled 2023-04-01: qty 2

## 2023-04-01 MED ORDER — METOCLOPRAMIDE HCL 5 MG/ML IJ SOLN
INTRAMUSCULAR | Status: AC
  Filled 2023-04-01: qty 2

## 2023-04-01 MED ORDER — ACETAMINOPHEN 650 MG RE SUPP: 650.0000 mg | Freq: Four times a day (QID) | RECTAL | Status: DC | PRN

## 2023-04-01 MED ORDER — PROPOFOL 10 MG/ML IV BOLUS
INTRAVENOUS | Status: AC
Start: 2023-04-01 — End: ?
  Filled 2023-04-01: qty 20

## 2023-04-01 MED ORDER — LIDOCAINE HCL (PF) 2 % IJ SOLN
INTRAMUSCULAR | Status: DC | PRN
  Administered 2023-04-01: 50 mg via INTRADERMAL

## 2023-04-01 MED ORDER — SODIUM CHLORIDE 0.9 % IV SOLN
50.0000 ug/h | INTRAVENOUS | Status: AC
  Administered 2023-04-01 – 2023-04-04 (×4): 50 ug/h via INTRAVENOUS
  Filled 2023-04-01 (×8): qty 1

## 2023-04-01 MED ORDER — LACTATED RINGERS IV SOLN: INTRAVENOUS | Status: AC

## 2023-04-01 MED ORDER — ACETAMINOPHEN 325 MG PO TABS
650.0000 mg | ORAL_TABLET | Freq: Four times a day (QID) | ORAL | Status: DC | PRN
  Filled 2023-04-01: qty 2

## 2023-04-01 MED ORDER — ONDANSETRON HCL 4 MG/2ML IJ SOLN
INTRAMUSCULAR | Status: AC
  Administered 2023-04-01: 4 mg via INTRAVENOUS
  Filled 2023-04-01: qty 2

## 2023-04-01 MED ORDER — ONDANSETRON HCL 4 MG/2ML IJ SOLN
INTRAMUSCULAR | Status: AC
  Filled 2023-04-01: qty 2

## 2023-04-01 MED ORDER — IOHEXOL 300 MG/ML  SOLN
100.0000 mL | Freq: Once | INTRAMUSCULAR | Status: AC | PRN
  Administered 2023-04-01: 100 mL via INTRAVENOUS

## 2023-04-01 MED ORDER — PANTOPRAZOLE SODIUM 40 MG IV SOLR
40.0000 mg | Freq: Four times a day (QID) | INTRAVENOUS | Status: DC
  Administered 2023-04-01: 40 mg via INTRAVENOUS
  Filled 2023-04-01: qty 10

## 2023-04-01 MED ORDER — SODIUM CHLORIDE 0.9 % IV SOLN
1.0000 g | INTRAVENOUS | Status: DC
  Administered 2023-04-01 – 2023-04-03 (×3): 1 g via INTRAVENOUS
  Filled 2023-04-01 (×3): qty 10

## 2023-04-01 MED ORDER — PANTOPRAZOLE SODIUM 40 MG IV SOLR: 40.0000 mg | Freq: Two times a day (BID) | INTRAVENOUS | Status: DC

## 2023-04-01 MED ORDER — METOPROLOL TARTRATE 5 MG/5ML IV SOLN
INTRAVENOUS | Status: AC
  Filled 2023-04-01: qty 5

## 2023-04-01 MED ORDER — ALBUMIN HUMAN 5 % IV SOLN: INTRAVENOUS | Status: DC | PRN

## 2023-04-01 NOTE — Hospital Course (Signed)
 73 year old female with controlled type 2 diabetes mellitus, hyperlipidemia with statin intolerance, allergic rhinitis, history of Lyme disease, Mnire's disease of left ear, adjustment disorder with anxiety started vomiting and feeling ill after having shrimp for supper.  She reportedly vomited multiple times at around 10 PM and started seeing blood in the emesis.  She is not having any abdominal pain.  She is having dizziness.  She denies chest pain or shortness of breath.  She is taking daily aspirin.  Patient was started on a pantoprazole protocol and given IV Zofran in the ED, hemoglobin noted to be 13 down to 11.  She also was noted to have an elevated BUN of 39.  GI was consulted and requested patient be kept n.p.o. and have a repeat CBC in the morning and likely endoscopy on 04/01/2023.

## 2023-04-01 NOTE — Op Note (Signed)
 [facility] Patient Name: Betty Gonzalez Procedure Date: 04/01/2023 12:57 PM MRN: [id number] Date of Birth: April 14, 1950 Attending MD: Sanjuan Dame , MD, [id number] CSN: [id number] Age: 73 Admit Type: Inpatient Procedure:                Upper GI endoscopy Indications:              Acute post hemorrhagic anemia, Hematemesis Providers:                Sanjuan Dame, MD, Nena Polio, RN, Cyril Mourning, Technician Referring MD:              Medicines:                Monitored Anesthesia Care Complications:            No immediate complications. Estimated Blood Loss:     Estimated blood loss: none. Procedure:                Pre-Anesthesia Assessment:                           - Prior to the procedure, a History and Physical                            was performed, and patient medications and                            allergies were reviewed. The patient's tolerance of                            previous anesthesia was also reviewed. The risks                            and benefits of the procedure and the sedation                            options and risks were discussed with the patient.                            All questions were answered, and informed consent                            was obtained. Prior Anticoagulants: The patient has                            taken no anticoagulant or antiplatelet agents. ASA                            Grade Assessment: III - A patient with severe                            systemic disease. After reviewing the risks and  benefits, the patient was deemed in satisfactory                            condition to undergo the procedure.                           After obtaining informed consent, the endoscope was                            passed under direct vision. Throughout the                            procedure, the patient's blood pressure, pulse, and                             oxygen saturations were monitored continuously. The                            [id number]-[id number] ([id number]) scope was introduced through the                            mouth, and advanced to the second part of duodenum.                            The upper GI endoscopy was accomplished without                            difficulty. The patient tolerated the procedure                            well. Scope In: 1:31:26 PM Scope Out: 2:12:25 PM Total Procedure Duration: 0 hours 40 minutes 59 seconds  Findings:      Four columns of grade III varices with stigmata of recent bleeding were       found in the entire esophagus. Red wale signs were present. Seven bands       were successfully placed with complete eradication, resulting in       deflation of varices. There was no bleeding during the procedure.      Hematin (altered blood/coffee-ground-like material) was found in the       stomach. Lavage of the area was performed using a large amount of       sterile water, resulting in clearance with fair visualization.      The duodenal bulb and second portion of the duodenum were normal. Impression:               - Grade III esophageal varices with stigmata of                            recent bleeding. Completely eradicated. Banded.                           - Hematin (altered blood/coffee-ground-like                            material) in the stomach.                           -  Normal duodenal bulb and second portion of the                            duodenum.                           - No specimens collected. Moderate Sedation:      Per Anesthesia Care Recommendation:           -ICU admission                           - Patient unknown to have cirrhosis , hence cause                            of esophageal varices unknown                           -NPO today, Clear liquid diet tomorrow AM                           -Start Octreotide drip for 72 hours                           -CBC q 8 hours.                            -ceftriaxone 1gm for 7 days                           -PPI IV BID for 3 days than OD oral for 4- 6 weeks                           -Would need Carvedilol (NSBB) upon discharge to                            uptitrate to a goal of HR: 55- 60 bpm and SBP>                                                      -CT abdomen and pelvis with IV contrast to evaluate                            for PVT, portal hypertension , cirrhosis                           -Repeat Upper endoscopy in 4- 6 weeks for                            surveillance of bleeding varices                           -If Significant rebleeding is encountered ; obtain  IR consult for TIPS                           -Spoke to the patient husband Betty Gonzalez                            [phone] in detail Procedure Code(s):        --- Professional ---                           [phone], Esophagogastroduodenoscopy, flexible,                            transoral; with band ligation of esophageal/gastric                            varices Diagnosis Code(s):        --- Professional ---                           I85.01, Esophageal varices with bleeding                           K92.2, Gastrointestinal hemorrhage, unspecified                           D62, Acute posthemorrhagic anemia                           K92.0, Hematemesis CPT copyright 2022 American Medical Association. All rights reserved. The codes documented in this report are preliminary and upon coder review may  be revised to meet current compliance requirements. Sanjuan Dame, MD Sanjuan Dame, MD 04/01/2023 2:32:41 PM This report has been signed electronically. Number of Addenda: 0

## 2023-04-01 NOTE — Interval H&P Note (Signed)
 History and Physical Interval Note:  04/01/2023 1:23 PM  Betty Gonzalez  has presented today for surgery, with the diagnosis of Hematemesis , Upper Gi bleed.  The various methods of treatment have been discussed with the patient and family. After consideration of risks, benefits and other options for treatment, the patient has consented to  Procedure(s): ESOPHAGOGASTRODUODENOSCOPY (EGD) WITH PROPOFOL (N/A) as a surgical intervention.  The patient's history has been reviewed, patient examined, no change in status, stable for surgery.  I have reviewed the patient's chart and labs.  Questions were answered to the patient's satisfaction.     [name]

## 2023-04-01 NOTE — ED Notes (Signed)
 Patient OTF to EGD procedure.

## 2023-04-01 NOTE — Transfer of Care (Addendum)
 Immediate Anesthesia Transfer of Care Note  Patient: Betty Gonzalez  Procedure(s) Performed: ESOPHAGOGASTRODUODENOSCOPY (EGD) WITH PROPOFOL  Patient Location: PACU  Anesthesia Type:General  Level of Consciousness: awake  Airway & Oxygen Therapy: Patient Spontanous Breathing  Post-op Assessment: Report given to RN  Post vital signs: Reviewed and stable  Last Vitals:  Vitals Value Taken Time  BP 151/89 04/01/23 1423  Temp    Pulse 102 04/01/23 1425  Resp    SpO2 96 % 04/01/23 1425  Vitals shown include unfiled device data.  Last Pain:  Vitals:   04/01/23 1135  TempSrc: Oral  PainSc: 0-No pain         Complications: No notable events documented.

## 2023-04-01 NOTE — Progress Notes (Signed)
 04/01/2023 4:14 PM   Called to see patient after EGD; Transfer to ICU due to grade III esophageal varices with bleeding, banded by Dr. Tasia Catchings during endoscopy. Starting patient on octreotide infusion 50 mcg per hour for 72 hours, IV  pantoprazole BID, IV ceftriaxone 1 gm daily x 7.  Updated husband and patient at bedside, they verbalized understanding.  Vitals:   04/01/23 1500 04/01/23 1514  BP: (!) 141/69   Pulse: 96   Resp: 17   Temp:  98.7 F (37.1 C)  SpO2: 99%    Critical Care Procedure Note Authorized and Performed by: Maryln Manuel MD  Total Critical Care time:  48 mins Due to a high probability of clinically significant, life threatening deterioration, the patient required my highest level of preparedness to intervene emergently and I personally spent this critical care time directly and personally managing the patient.  This critical care time included obtaining a history; examining the patient, pulse oximetry; ordering and review of studies; arranging urgent treatment with development of a management plan; evaluation of patient's response of treatment; frequent reassessment; and discussions with other providers.  This critical care time was performed to assess and manage the high probability of imminent and life threatening deterioration that could result in multi-organ failure.  It was exclusive of separately billable procedures and treating other patients and teaching time.   Rodney Langton MD  How to contact the [facility] Attending or Consulting provider 7A - 7P or covering provider during after hours 7P -7A, for this patient?  Check the care team in [facility] and look for a) attending/consulting TRH provider listed and b) the [facility] team listed Log into [email or web address] and use [place]'s universal password to access. If you do not have the password, please contact the hospital operator. Locate the [facility] provider you are looking for under [facility] and page to a number that you can be directly  reached. If you still have difficulty reaching the provider, please page the [facility] (Director on Call) for the Hospitalists listed on amion for assistance.

## 2023-04-01 NOTE — Progress Notes (Signed)
   04/01/23 1112  TOC Brief Assessment  Insurance and Status Reviewed  Patient has primary care physician Yes  Home environment has been reviewed from home  Prior level of function: independent  Prior/Current Home Services No current home services  Social Drivers of Health Review SDOH reviewed no interventions necessary  Readmission risk has been reviewed Yes  Transition of care needs no transition of care needs at this time     Transition of Care Department [facility]) has reviewed patient and no TOC needs have been identified at this time. We will continue to monitor patient advancement through interdisciplinary progression rounds. If new patient transition needs arise, please place a TOC consult.

## 2023-04-01 NOTE — ED Notes (Signed)
 Occult blood card done, hemoccult positive.

## 2023-04-01 NOTE — Progress Notes (Signed)
 Patient underwent EGD with banding under propofol sedation.  Tolerated the procedure adequately.   FINDINGS:  - Grade III esophageal varices with stigmata of recent bleeding. Completely eradicated. Banded.   -Large  Hematin ( altered blood/ coffee- ground- like material) in the stomach.   - Normal duodenal bulb and second portion of the duodenum.  RECOMMENDATIONS  -ICU admission  - Patient unknown to have cirrhosis , hence cause of esophageal varices unknown  -CT abdomen and pelvis with IV contrast to evaluate for PVT, portal hypertension , cirrhosis   -NPO today, Clear liquid diet tomorrow AM   -Start Octreotide for 72 hours   -CBC q 8 hours.  -ceftriaxone 1gm for 7 days   -PPI IV BID for 3 days than OD oral for 4- 6 weeks   -Would need Carvedilol (NSBB) upon discharge to uptitrate to a goal of HR: 55- 60 bpm and SBP>   -Repeat Upper endoscopy in 4- 6 weeks for surveillance of bleeding varices   -If Significant rebleeding is encountered would obtain IR consult for TIPS   -Spoke to the patient husband [name],[name] [phone]   Vista Lawman, MD Gastroenterology and Hepatology [facility]ology

## 2023-04-01 NOTE — ED Provider Notes (Signed)
 [place] [facility] AT [facility] Provider Note   CSN: [id number] Arrival date & time: 04/01/23  4098     History  Chief Complaint  Patient presents with   Hematemesis    Betty Gonzalez is a 73 y.o. female.  Patient presents to the emergency department for evaluation of vomiting.  Patient reports that she had shrimp for dinner, when she got home she started feeling sick and vomited multiple times in the driveway.  Patient reports later tonight, she went to the bathroom and vomited multiple times and saw that it was blood.  Patient denies any pain.  She denies any history of GI bleeding, peptic ulcer disease.       Home Medications Prior to Admission medications   Medication Sig Start Date End Date Taking? Authorizing Provider  aspirin 81 MG chewable tablet Chew 81 mg by mouth daily.    [provider]  azelastine (ASTELIN) 0.1 % nasal spray Place 1 spray into both nostrils 2 (two) times daily. Use in each nostril as directed 04/29/21   Particia Nearing, PA-C  cyclobenzaprine (FLEXERIL) 5 MG tablet Take 1 tablet (5 mg total) by mouth 3 (three) times daily as needed for muscle spasms. Do not drink alcohol or drive while taking this medication, may cause drowsiness 04/29/21   Particia Nearing, PA-C  doxycycline (VIBRAMYCIN) 100 MG capsule Take 1 capsule (100 mg total) by mouth 2 (two) times daily. 05/20/22   Raspet, Noberto Retort, PA-C  Echinacea 400 MG CAPS Take by mouth.    [provider]  escitalopram (LEXAPRO) 5 MG tablet Take 5 mg by mouth daily.    [provider]  fluticasone (FLONASE) 50 MCG/ACT nasal spray Place 2 sprays into both nostrils daily. 09/25/17   Ofilia Neas, PA-C  Glucosamine 500 MG CAPS Take by mouth.    [provider]  LORazepam (ATIVAN) 1 MG tablet Take 1 tablet (1 mg total) by mouth daily as needed for anxiety. No refills until 09/29/2017 09/25/17   Ofilia Neas, PA-C  metFORMIN  (GLUCOPHAGE-XR) 500 MG 24 hr tablet TAKE 1 TABLET(500 MG) BY MOUTH DAILY WITH BREAKFAST 10/01/18   Collie Siad A, MD  omega-3 acid ethyl esters (LOVAZA) 1 g capsule Take 1 g by mouth 2 (two) times daily.    [provider]  traMADol (ULTRAM) 50 MG tablet Take 1 tablet (50 mg total) by mouth every 6 (six) hours as needed. 06/02/20   Moshe Cipro, FNP  Zinc Sulfate (ZINC-220 PO) Take by mouth.    [provider]      Allergies    Codeine, Other, Sulfonamide derivatives, Pedi-pre tape spray [wound dressing adhesive], Penicillins, and Tape    Review of Systems   Review of Systems  Physical Exam Updated Vital Signs BP 120/66   Pulse 84   Temp 98 F (36.7 C) (Oral)   Resp 16   Ht 5\' 8"  (1.727 m)   Wt 76 kg   SpO2 95%   BMI 25.48 kg/m  Physical Exam Vitals and nursing note reviewed.  Constitutional:      General: She is not in acute distress.    Appearance: She is well-developed.  HENT:     Head: Normocephalic and atraumatic.     Mouth/Throat:     Mouth: Mucous membranes are moist.  Eyes:     General: Vision grossly intact. Gaze aligned appropriately.     Extraocular Movements: Extraocular movements intact.  Conjunctiva/sclera: Conjunctivae normal.  Cardiovascular:     Rate and Rhythm: Normal rate and regular rhythm.     Pulses: Normal pulses.     Heart sounds: Normal heart sounds, S1 normal and S2 normal. No murmur heard.    No friction rub. No gallop.  Pulmonary:     Effort: Pulmonary effort is normal. No respiratory distress.     Breath sounds: Normal breath sounds.  Abdominal:     General: Bowel sounds are normal.     Palpations: Abdomen is soft.     Tenderness: There is no abdominal tenderness. There is no guarding or rebound.     Hernia: No hernia is present.  Musculoskeletal:        General: No swelling.     Cervical back: Full passive range of motion without pain, normal range of motion and neck supple. No spinous process tenderness or  muscular tenderness. Normal range of motion.     Right lower leg: No edema.     Left lower leg: No edema.  Skin:    General: Skin is warm and dry.     Capillary Refill: Capillary refill takes less than 2 seconds.     Findings: No ecchymosis, erythema, rash or wound.  Neurological:     General: No focal deficit present.     Mental Status: She is alert and oriented to person, place, and time.     GCS: GCS eye subscore is 4. GCS verbal subscore is 5. GCS motor subscore is 6.     Cranial Nerves: Cranial nerves 2-12 are intact.     Sensory: Sensation is intact.     Motor: Motor function is intact.     Coordination: Coordination is intact.  Psychiatric:        Attention and Perception: Attention normal.        Mood and Affect: Mood normal.        Speech: Speech normal.        Behavior: Behavior normal.     ED Results / Procedures / Treatments   Labs (all labs ordered are listed, but only abnormal results are displayed) Labs Reviewed  COMPREHENSIVE METABOLIC PANEL - Abnormal; Notable for the following components:      Result Value   Glucose, Bld 209 (*)    BUN 39 (*)    Calcium 8.4 (*)    Albumin 3.2 (*)    Total Bilirubin 1.7 (*)    All other components within normal limits  CBC - Abnormal; Notable for the following components:   RBC 3.62 (*)    Hemoglobin 11.3 (*)    HCT 35.1 (*)    All other components within normal limits  PROTIME-INR - Abnormal; Notable for the following components:   Prothrombin Time 15.7 (*)    All other components within normal limits  LIPASE, BLOOD  POC OCCULT BLOOD, ED  TYPE AND SCREEN  ABO/RH    EKG None  Radiology No results found.  Procedures Procedures    Medications Ordered in ED Medications  pantoprazole (PROTONIX) injection 40 mg (40 mg Intravenous Given 04/01/23 0305)    Followed by  pantoprazole (PROTONIX) injection 40 mg (has no administration in time range)    Followed by  pantoprazole (PROTONIX) injection 40 mg (has no  administration in time range)  lactated ringers bolus 1,000 mL (0 mLs Intravenous Stopped 04/01/23 0339)  ondansetron (ZOFRAN) injection 4 mg (4 mg Intravenous Given 04/01/23 0305)    ED Course/ Medical Decision Making/ A&P  Medical Decision Making Amount and/or Complexity of Data Reviewed Labs: ordered.  Risk Prescription drug management.   Patient presents to the emergency department with hematemesis.  She reports that she got ill after eating dinner tonight and vomited several times.  She reports that she vomited outside, did not look at the vomit.  Later tonight she vomited multiple times in the bathroom and noticed that she was vomiting blood.  Here in the ED she has vomited several times, gross blood noted in the vomit.  Patient has not been hypotensive.  She was given a liter of fluid and has done well.  Patient placed on Protonix protocol.  IV Zofran has stopped her vomiting.  Hemoglobin has dropped from baseline of 13-11.  I suspect it will fall further.  BUN also evaded at 39 consistent with upper GI bleed.  Discussed with Dr. Tasia Catchings, on-call for GI.  Recommends admission to the hospitalist service, keep n.p.o.  Repeat CBC at 8 AM.  If no further bleeding, he will perform endoscopy today.  CRITICAL CARE Performed by: Gilda Crease   Total critical care time: 30 minutes  Critical care time was exclusive of separately billable procedures and treating other patients.  Critical care was necessary to treat or prevent imminent or life-threatening deterioration.  Critical care was time spent personally by me on the following activities: development of treatment plan with patient and/or surrogate as well as nursing, discussions with consultants, evaluation of patient's response to treatment, examination of patient, obtaining history from patient or surrogate, ordering and performing treatments and interventions, ordering and review of laboratory  studies, ordering and review of radiographic studies, pulse oximetry and re-evaluation of patient's condition.           Final Clinical Impression(s) / ED Diagnoses Final diagnoses:  UGI bleed    Rx / DC Orders ED Discharge Orders     None         Indea Dearman, Canary Brim, MD 04/01/23 [phone]

## 2023-04-01 NOTE — H&P (Signed)
 History and Physical  [facility]  VIBHA FERDIG UEA:[id number] DOB: 04/04/1950 DOA: 04/01/2023  PCP: Ofilia Neas, PA-C  Patient coming from: Home  Level of care: Med-Surg  I have personally briefly reviewed patient's old medical records in [facility]  Chief Complaint: Vomiting   HPI: Betty Gonzalez is a 73 year old female with controlled type 2 diabetes mellitus, hyperlipidemia with statin intolerance, allergic rhinitis, history of Lyme disease, Mnire's disease of left ear, adjustment disorder with anxiety started vomiting and feeling ill after having shrimp for supper.  She reportedly vomited multiple times at around 10 PM and started seeing blood in the emesis.  She is not having any abdominal pain.  She is having dizziness.  She denies chest pain or shortness of breath.  She is taking daily aspirin.  Patient was started on a pantoprazole protocol and given IV Zofran in the ED, hemoglobin noted to be 13 down to 11.  She also was noted to have an elevated BUN of 39.  GI was consulted and requested patient be kept n.p.o. and have a repeat CBC in the morning and likely endoscopy on 04/01/2023.   Past Medical History:  Diagnosis Date   Chronic kidney disease    Diabetes mellitus without complication (HCC)    Hyperlipidemia    Lyme disease     Past Surgical History:  Procedure Laterality Date   NASAL SEPTUM SURGERY       reports that she has quit smoking. Her smoking use included cigars and cigarettes. She has never used smokeless tobacco. She reports current drug use. Drug: Marijuana. She reports that she does not drink alcohol.  Allergies  Allergen Reactions   Codeine    Other     Thermasol - active ingredient in the flu shot -- made eyes swell up  Statins - affects muscles in legs   Sulfonamide Derivatives    Pedi-Pre Tape Spray [Wound Dressing Adhesive] Other (See Comments)    Adhesive Tape;  Blisters    Penicillins Rash   Tape Rash and Other (See  Comments)    Adhesive Tape;  Blisters  Blisters Adhesive Tape;  Blisters     Family History  Problem Relation Age of Onset   Dementia Mother    Healthy Father     Prior to Admission medications   Medication Sig Start Date End Date Taking? Authorizing Provider  aspirin 81 MG chewable tablet Chew 81 mg by mouth daily.    [provider]  azelastine (ASTELIN) 0.1 % nasal spray Place 1 spray into both nostrils 2 (two) times daily. Use in each nostril as directed 04/29/21   Particia Nearing, PA-C  cyclobenzaprine (FLEXERIL) 5 MG tablet Take 1 tablet (5 mg total) by mouth 3 (three) times daily as needed for muscle spasms. Do not drink alcohol or drive while taking this medication, may cause drowsiness 04/29/21   Particia Nearing, PA-C  doxycycline (VIBRAMYCIN) 100 MG capsule Take 1 capsule (100 mg total) by mouth 2 (two) times daily. 05/20/22   Raspet, Noberto Retort, PA-C  Echinacea 400 MG CAPS Take by mouth.    [provider]  escitalopram (LEXAPRO) 5 MG tablet Take 5 mg by mouth daily.    [provider]  fluticasone (FLONASE) 50 MCG/ACT nasal spray Place 2 sprays into both nostrils daily. 09/25/17   Ofilia Neas, PA-C  Glucosamine 500 MG CAPS Take by mouth.    [provider]  LORazepam (ATIVAN) 1 MG tablet Take  1 tablet (1 mg total) by mouth daily as needed for anxiety. No refills until 09/29/2017 09/25/17   Ofilia Neas, PA-C  metFORMIN (GLUCOPHAGE-XR) 500 MG 24 hr tablet TAKE 1 TABLET(500 MG) BY MOUTH DAILY WITH BREAKFAST 10/01/18   Collie Siad A, MD  omega-3 acid ethyl esters (LOVAZA) 1 g capsule Take 1 g by mouth 2 (two) times daily.    [provider]  traMADol (ULTRAM) 50 MG tablet Take 1 tablet (50 mg total) by mouth every 6 (six) hours as needed. 06/02/20   Moshe Cipro, FNP  Zinc Sulfate (ZINC-220 PO) Take by mouth.    [provider]    Physical Exam: Vitals:   04/01/23 0615 04/01/23 0630 04/01/23 0645  04/01/23 0649  BP: (!) 125/59 127/62 (!) 129/58   Pulse: 92 94 (!) 101 96  Resp: 18 18 (!) 21 17  Temp:      TempSrc:      SpO2: 95% 94% 94% 95%  Weight:      Height:       Constitutional: NAD, calm, comfortable Eyes: PERRL, lids and conjunctivae normal ENMT: Mucous membranes are moist. Posterior pharynx clear of any exudate or lesions.Normal dentition.  Neck: normal, supple, no masses, no thyromegaly Respiratory: clear to auscultation bilaterally, no wheezing, no crackles. Normal respiratory effort. No accessory muscle use.  Cardiovascular: normal s1, s2 sounds, no murmurs / rubs / gallops. No extremity edema. 2+ pedal pulses. No carotid bruits.  Abdomen: no tenderness, no masses palpated. No hepatosplenomegaly. Bowel sounds positive.  Musculoskeletal: no clubbing / cyanosis. No joint deformity upper and lower extremities. Good ROM, no contractures. Normal muscle tone.  Skin: no rashes, lesions, ulcers. No induration Neurologic: CN 2-12 grossly intact. Sensation intact, DTR normal. Strength 5/5 in all 4.  Psychiatric: Normal judgment and insight. Alert and oriented x 3. Normal mood.   Labs on Admission: I have personally reviewed following labs and imaging studies  CBC: Recent Labs  Lab 04/01/23 0249  WBC 9.3  HGB 11.3*  HCT 35.1*  MCV 97.0  PLT 176   Basic Metabolic Panel: Recent Labs  Lab 04/01/23 0249  NA 139  K 3.5  CL 105  CO2 24  GLUCOSE 209*  BUN 39*  CREATININE 0.76  CALCIUM 8.4*   GFR: Estimated Creatinine Clearance: 62.2 mL/min (by C-G formula based on SCr of 0.76 mg/dL). Liver Function Tests: Recent Labs  Lab 04/01/23 0249  AST 28  ALT 24  ALKPHOS 105  BILITOT 1.7*  PROT 6.7  ALBUMIN 3.2*   Recent Labs  Lab 04/01/23 0249  LIPASE 34   No results for input(s): "AMMONIA" in the last 168 hours. Coagulation Profile: Recent Labs  Lab 04/01/23 0249  INR 1.2   Cardiac Enzymes: No results for input(s): "CKTOTAL", "CKMB", "CKMBINDEX",  "TROPONINI" in the last 168 hours. BNP (last 3 results) No results for input(s): "PROBNP" in the last 8760 hours. HbA1C: No results for input(s): "HGBA1C" in the last 72 hours. CBG: No results for input(s): "GLUCAP" in the last 168 hours. Lipid Profile: No results for input(s): "CHOL", "HDL", "LDLCALC", "TRIG", "CHOLHDL", "LDLDIRECT" in the last 72 hours. Thyroid Function Tests: No results for input(s): "TSH", "T4TOTAL", "FREET4", "T3FREE", "THYROIDAB" in the last 72 hours. Anemia Panel: No results for input(s): "VITAMINB12", "FOLATE", "FERRITIN", "TIBC", "IRON", "RETICCTPCT" in the last 72 hours. Urine analysis:    Component Value Date/Time   BILIRUBINUR negative 07/21/2020 1053   BILIRUBINUR Negative 10/22/2014 1717   KETONESUR negative 07/21/2020 1053  PROTEINUR negative 07/21/2020 1053   PROTEINUR Negative 10/22/2014 1717   UROBILINOGEN 0.2 07/21/2020 1053   NITRITE Negative 07/21/2020 1053   NITRITE Negative 10/22/2014 1717   LEUKOCYTESUR Negative 07/21/2020 1053    Radiological Exams on Admission: No results found.  Assessment/Plan Principal Problem:   Upper GI bleed Active Problems:   Vomiting blood   Statin intolerance   Allergic rhinitis   Hyperlipemia   Meniere's disease of left ear   Type 2 diabetes mellitus (HCC)   Acute Upper GI Hemorrhage  - started shortly after meal containing shrimp - admit for further management  - IV pantoprazole as ordered - IV fluids for supportive care - monitor CBC - type and screen  - NPO per GI request - GI consultation pending - Endoscopy planned for later today   Type 2 DM controlled  - as evidenced by A1c of 6.8% Jan 2025 - SSI coverage with CBG monitoring ordered   DVT prophylaxis: SCDs  Code Status: Full   Family Communication:   Disposition Plan: anticipate home   Consults called: GI   Level of care: Med-Surg Standley Dakins MD [facility] How to contact the [facility] Attending or Consulting provider  7A - 7P or covering provider during after hours 7P -7A, for this patient?  Check the care team in [facility] and look for a) attending/consulting [facility] provider listed and b) the [facility] team listed Log into [email or web address] and use [place]'s universal password to access. If you do not have the password, please contact the hospital operator. Locate the [facility] provider you are looking for under [facility] and page to a number that you can be directly reached. If you still have difficulty reaching the provider, please page the [facility] (Director on Call) for the Hospitalists listed on amion for assistance.   If 7PM-7AM, please contact night-coverage [email or web address] Password [facility]  04/01/2023, 7:21 AM

## 2023-04-01 NOTE — Consult Note (Signed)
 Vista Lawman, M.D. Gastroenterology & Hepatology                                           Patient Name: Betty Gonzalez  MRN: [id number] Admission Date: 04/01/2023 Date of Evaluation:  04/01/2023 Time of Evaluation: 11:00 AM  Chief Complaint: Nausea and vomiting with blood  HPI:  This is a 73 y.o. female with history of diabetes, hyperlipidemia, history of Lyme disease presented with nausea and vomiting.  GI is consulted for episodes of hematemesis  Patient reports last night after 10 PM she started to have multiple episodes of vomiting after eating shrimp for dinner.  This was accompanied with some fresh blood in the vomitus.  Patient takes ibuprofen 3 times/week and  baby aspirin.  This morning patient is better without any subsequent vomiting or hematemesis Baseline hemoglobin 14.5 from 02/2023 which trended down to 10.2 Elevated BUN/creatinine 39/0.76 No prior upper endoscopy Fit test NEGATIVE 02/2023  Past Medical History: SEE CHRONIC ISSSUES: Past Medical History:  Diagnosis Date   Chronic kidney disease    Diabetes mellitus without complication (HCC)    Hyperlipidemia    Lyme disease    Past Surgical History:  Past Surgical History:  Procedure Laterality Date   NASAL SEPTUM SURGERY     Family History:  Family History  Problem Relation Age of Onset   Dementia Mother    Healthy Father    Social History:  Social History   Tobacco Use   Smoking status: Former    Types: Cigars, Cigarettes   Smokeless tobacco: Never   Tobacco comments:    1 cigar occasion  Vaping Use   Vaping status: Never Used  Substance Use Topics   Alcohol use: No    Alcohol/week: 0.0 standard drinks of alcohol   Drug use: Yes    Types: Marijuana    Home Medications:  Prior to Admission medications   Medication Sig Start Date End Date Taking? Authorizing Provider  aspirin 81 MG chewable tablet Chew 81 mg by mouth daily.    [provider]  azelastine (ASTELIN) 0.1 % nasal  spray Place 1 spray into both nostrils 2 (two) times daily. Use in each nostril as directed 04/29/21   Particia Nearing, PA-C  cyclobenzaprine (FLEXERIL) 5 MG tablet Take 1 tablet (5 mg total) by mouth 3 (three) times daily as needed for muscle spasms. Do not drink alcohol or drive while taking this medication, may cause drowsiness 04/29/21   Particia Nearing, PA-C  doxycycline (VIBRAMYCIN) 100 MG capsule Take 1 capsule (100 mg total) by mouth 2 (two) times daily. 05/20/22   Raspet, Noberto Retort, PA-C  Echinacea 400 MG CAPS Take by mouth.    [provider]  escitalopram (LEXAPRO) 5 MG tablet Take 5 mg by mouth daily.    [provider]  fluticasone (FLONASE) 50 MCG/ACT nasal spray Place 2 sprays into both nostrils daily. 09/25/17   Ofilia Neas, PA-C  Glucosamine 500 MG CAPS Take by mouth.    [provider]  LORazepam (ATIVAN) 1 MG tablet Take 1 tablet (1 mg total) by mouth daily as needed for anxiety. No refills until 09/29/2017 09/25/17   Ofilia Neas, PA-C  metFORMIN (GLUCOPHAGE-XR) 500 MG 24 hr tablet TAKE 1 TABLET(500 MG) BY MOUTH DAILY WITH BREAKFAST 10/01/18   Doristine Bosworth, MD  omega-3 acid ethyl  esters (LOVAZA) 1 g capsule Take 1 g by mouth 2 (two) times daily.    [provider]  traMADol (ULTRAM) 50 MG tablet Take 1 tablet (50 mg total) by mouth every 6 (six) hours as needed. 06/02/20   Moshe Cipro, FNP  Zinc Sulfate (ZINC-220 PO) Take by mouth.    [provider]    Inpatient Medications:  Current Facility-Administered Medications:    acetaminophen (TYLENOL) tablet 650 mg, 650 mg, Oral, Q6H PRN **OR** acetaminophen (TYLENOL) suppository 650 mg, 650 mg, Rectal, Q6H PRN, Johnson, Clanford L, MD   fentaNYL (SUBLIMAZE) injection 12.5-25 mcg, 12.5-25 mcg, Intravenous, Q2H PRN, Johnson, Clanford L, MD   insulin aspart (novoLOG) injection 0-6 Units, 0-6 Units, Subcutaneous, TID WC, Johnson, Clanford L, MD, 1 Units at 04/01/23 0758    lactated ringers infusion, , Intravenous, Continuous, Johnson, Clanford L, MD, Last Rate: 40 mL/hr at 04/01/23 0811, New Bag at 04/01/23 0811   ondansetron (ZOFRAN) tablet 4 mg, 4 mg, Oral, Q6H PRN **OR** ondansetron (ZOFRAN) injection 4 mg, 4 mg, Intravenous, Q6H PRN, Johnson, Clanford L, MD   [COMPLETED] pantoprazole (PROTONIX) injection 40 mg, 40 mg, Intravenous, Q5 min, 40 mg at 04/01/23 0305 **FOLLOWED BY** pantoprazole (PROTONIX) injection 40 mg, 40 mg, Intravenous, Q6H, 40 mg at 04/01/23 0752 **FOLLOWED BY** [START ON 04/04/2023] pantoprazole (PROTONIX) injection 40 mg, 40 mg, Intravenous, Q12H, Johnson, Clanford L, MD   prochlorperazine (COMPAZINE) injection 10 mg, 10 mg, Intravenous, Q4H PRN, Johnson, Clanford L, MD  Current Outpatient Medications:    aspirin 81 MG chewable tablet, Chew 81 mg by mouth daily., Disp: , Rfl:    azelastine (ASTELIN) 0.1 % nasal spray, Place 1 spray into both nostrils 2 (two) times daily. Use in each nostril as directed, Disp: 30 mL, Rfl: 2   cyclobenzaprine (FLEXERIL) 5 MG tablet, Take 1 tablet (5 mg total) by mouth 3 (three) times daily as needed for muscle spasms. Do not drink alcohol or drive while taking this medication, may cause drowsiness, Disp: 15 tablet, Rfl: 0   doxycycline (VIBRAMYCIN) 100 MG capsule, Take 1 capsule (100 mg total) by mouth 2 (two) times daily., Disp: 20 capsule, Rfl: 0   Echinacea 400 MG CAPS, Take by mouth., Disp: , Rfl:    escitalopram (LEXAPRO) 5 MG tablet, Take 5 mg by mouth daily., Disp: , Rfl:    fluticasone (FLONASE) 50 MCG/ACT nasal spray, Place 2 sprays into both nostrils daily., Disp: 16 g, Rfl: 6   Glucosamine 500 MG CAPS, Take by mouth., Disp: , Rfl:    LORazepam (ATIVAN) 1 MG tablet, Take 1 tablet (1 mg total) by mouth daily as needed for anxiety. No refills until 09/29/2017, Disp: 90 tablet, Rfl: 1   metFORMIN (GLUCOPHAGE-XR) 500 MG 24 hr tablet, TAKE 1 TABLET(500 MG) BY MOUTH DAILY WITH BREAKFAST, Disp: 30 tablet, Rfl:  0   omega-3 acid ethyl esters (LOVAZA) 1 g capsule, Take 1 g by mouth 2 (two) times daily., Disp: , Rfl:    traMADol (ULTRAM) 50 MG tablet, Take 1 tablet (50 mg total) by mouth every 6 (six) hours as needed., Disp: 15 tablet, Rfl: 0   Zinc Sulfate (ZINC-220 PO), Take by mouth., Disp: , Rfl:  Allergies: Codeine, Other, Sulfonamide derivatives, Pedi-pre tape spray [wound dressing adhesive], Penicillins, and Tape  Complete Review of Systems: GENERAL: negative for malaise, night sweats HEENT: No changes in hearing or vision, no nose bleeds or other nasal problems. NECK: Negative for lumps, goiter, pain and significant neck swelling RESPIRATORY:  Negative for cough, wheezing CARDIOVASCULAR: Negative for chest pain, leg swelling, palpitations, orthopnea GI: SEE HPI MUSCULOSKELETAL: Negative for joint pain or swelling, back pain, and muscle pain. SKIN: Negative for lesions, rash PSYCH: Negative for sleep disturbance, mood disorder and recent psychosocial stressors. HEMATOLOGY Negative for prolonged bleeding, bruising easily, and swollen nodes. ENDOCRINE: Negative for cold or heat intolerance, polyuria, polydipsia and goiter. NEURO: negative for tremor, gait imbalance, syncope and seizures. The remainder of the review of systems is noncontributory.  Physical Exam: BP 120/66   Pulse (!) 102   Temp 97.6 F (36.4 C) (Oral)   Resp 19   Ht 5\' 8"  (1.727 m)   Wt 76 kg   SpO2 95%   BMI 25.48 kg/m  GENERAL: The patient is AO x3, in no acute distress. HEENT: Head is normocephalic and atraumatic. EOMI are intact. Mouth is well hydrated and without lesions. NECK: Supple. No masses LUNGS: Clear to auscultation. No presence of rhonchi/wheezing/rales. Adequate chest expansion HEART: RRR, normal s1 and s2. ABDOMEN: Soft, nontender, no guarding, no peritoneal signs, and nondistended. BS +. No masses. EXTREMITIES: Without any cyanosis, clubbing, rash, lesions or edema. NEUROLOGIC: AOx3, no focal motor  deficit. SKIN: no jaundice, no rashes  Laboratory Data CBC:     Component Value Date/Time   WBC 8.8 04/01/2023 0753   RBC 3.14 (L) 04/01/2023 0753   HGB 10.2 (L) 04/01/2023 0753   HGB 13.3 08/28/2017 1007   HCT 30.3 (L) 04/01/2023 0753   HCT 38.6 08/28/2017 1007   PLT 142 (L) 04/01/2023 0753   PLT 260 08/28/2017 1007   MCV 96.5 04/01/2023 0753   MCV 87 08/28/2017 1007   MCH 32.5 04/01/2023 0753   MCHC 33.7 04/01/2023 0753   RDW 13.9 04/01/2023 0753   RDW 14.1 08/28/2017 1007   LYMPHSABS 1.7 08/28/2017 1007   MONOABS 671 08/22/2015 1549   EOSABS 0.2 08/28/2017 1007   BASOSABS 0.0 08/28/2017 1007   COAG:  Lab Results  Component Value Date   INR 1.2 04/01/2023    BMP:     Latest Ref Rng & Units 04/01/2023    2:49 AM 08/28/2017   10:07 AM 02/01/2016    5:27 PM  BMP  Glucose 70 - 99 mg/dL 846  962  952   BUN 8 - 23 mg/dL 39  16  15   Creatinine 0.44 - 1.00 mg/dL 8.41  3.24  4.01   BUN/Creat Ratio 12 - 28  17  17    Sodium 135 - 145 mmol/L 139  139  142   Potassium 3.5 - 5.1 mmol/L 3.5  4.5  3.7   Chloride 98 - 111 mmol/L 105  103  100   CO2 22 - 32 mmol/L 24  25  25    Calcium 8.9 - 10.3 mg/dL 8.4  9.6  8.9     HEPATIC:     Latest Ref Rng & Units 04/01/2023    2:49 AM 08/28/2017   10:07 AM 08/07/2017   11:04 AM  Hepatic Function  Total Protein 6.5 - 8.1 g/dL 6.7  7.2  7.5   Albumin 3.5 - 5.0 g/dL 3.2  4.4  4.6   AST 15 - 41 U/L 28  16  19    ALT 0 - 44 U/L 24  15  17    Alk Phosphatase 38 - 126 U/L 105  84  86   Total Bilirubin 0.0 - 1.2 mg/dL 1.7  0.3  0.3   Bilirubin, Direct 0.00 - 0.40 mg/dL  0.09  0.09     CARDIAC:  Lab Results  Component Value Date   CKTOTAL 168 08/22/2015     Imaging: I personally reviewed and interpreted the available imaging.  Assessment & Plan:   This is a 73 y.o. female with history of diabetes, hyperlipidemia, history of Lyme disease presented with nausea and vomiting.  GI is consulted for episodes of hematemesis  #Nausea  vomiting with Hematemesis  This is likely Mallory-Weiss tear in setting of vomiting and retching.  Could be esophagitis gastritis or peptic ulcer disease  Patient takes ibuprofen so could be NSAID induced PUD   Patient had witnessed hematemesis in hospital and also evident by her lab values for unit drop in hemoglobin with elevated BUN/creatinine ratio more than 30 consistent with upper GI bleed  Plan for upper endoscopy today with possible control of bleeding PPI IV twice daily Keep n.p.o. 2 large-bore IV lines and trend hemoglobin Transfuse to keep hemoglobin more than 7   Vista Lawman, MD Gastroenterology and Hepatology [facility] Gastroenterology  This chart has been completed using [facility] Dictation software, and while attempts have been made to ensure accuracy , certain words and phrases may not be transcribed as intended

## 2023-04-01 NOTE — H&P (View-Only) (Signed)
 Vista Lawman, M.D. Gastroenterology & Hepatology                                           Patient Name: Betty Gonzalez  MRN: [id number] Admission Date: 04/01/2023 Date of Evaluation:  04/01/2023 Time of Evaluation: 11:00 AM  Chief Complaint: Nausea and vomiting with blood  HPI:  This is a 73 y.o. female with history of diabetes, hyperlipidemia, history of Lyme disease presented with nausea and vomiting.  GI is consulted for episodes of hematemesis  Patient reports last night after 10 PM she started to have multiple episodes of vomiting after eating shrimp for dinner.  This was accompanied with some fresh blood in the vomitus.  Patient takes ibuprofen 3 times/week and  baby aspirin.  This morning patient is better without any subsequent vomiting or hematemesis Baseline hemoglobin 14.5 from 02/2023 which trended down to 10.2 Elevated BUN/creatinine 39/0.76 No prior upper endoscopy Fit test NEGATIVE 02/2023  Past Medical History: SEE CHRONIC ISSSUES: Past Medical History:  Diagnosis Date   Chronic kidney disease    Diabetes mellitus without complication (HCC)    Hyperlipidemia    Lyme disease    Past Surgical History:  Past Surgical History:  Procedure Laterality Date   NASAL SEPTUM SURGERY     Family History:  Family History  Problem Relation Age of Onset   Dementia Mother    Healthy Father    Social History:  Social History   Tobacco Use   Smoking status: Former    Types: Cigars, Cigarettes   Smokeless tobacco: Never   Tobacco comments:    1 cigar occasion  Vaping Use   Vaping status: Never Used  Substance Use Topics   Alcohol use: No    Alcohol/week: 0.0 standard drinks of alcohol   Drug use: Yes    Types: Marijuana    Home Medications:  Prior to Admission medications   Medication Sig Start Date End Date Taking? Authorizing Provider  aspirin 81 MG chewable tablet Chew 81 mg by mouth daily.    [provider]  azelastine (ASTELIN) 0.1 % nasal  spray Place 1 spray into both nostrils 2 (two) times daily. Use in each nostril as directed 04/29/21   Particia Nearing, PA-C  cyclobenzaprine (FLEXERIL) 5 MG tablet Take 1 tablet (5 mg total) by mouth 3 (three) times daily as needed for muscle spasms. Do not drink alcohol or drive while taking this medication, may cause drowsiness 04/29/21   Particia Nearing, PA-C  doxycycline (VIBRAMYCIN) 100 MG capsule Take 1 capsule (100 mg total) by mouth 2 (two) times daily. 05/20/22   Raspet, Noberto Retort, PA-C  Echinacea 400 MG CAPS Take by mouth.    [provider]  escitalopram (LEXAPRO) 5 MG tablet Take 5 mg by mouth daily.    [provider]  fluticasone (FLONASE) 50 MCG/ACT nasal spray Place 2 sprays into both nostrils daily. 09/25/17   Ofilia Neas, PA-C  Glucosamine 500 MG CAPS Take by mouth.    [provider]  LORazepam (ATIVAN) 1 MG tablet Take 1 tablet (1 mg total) by mouth daily as needed for anxiety. No refills until 09/29/2017 09/25/17   Ofilia Neas, PA-C  metFORMIN (GLUCOPHAGE-XR) 500 MG 24 hr tablet TAKE 1 TABLET(500 MG) BY MOUTH DAILY WITH BREAKFAST 10/01/18   Doristine Bosworth, MD  omega-3 acid ethyl  esters (LOVAZA) 1 g capsule Take 1 g by mouth 2 (two) times daily.    [provider]  traMADol (ULTRAM) 50 MG tablet Take 1 tablet (50 mg total) by mouth every 6 (six) hours as needed. 06/02/20   Moshe Cipro, FNP  Zinc Sulfate (ZINC-220 PO) Take by mouth.    [provider]    Inpatient Medications:  Current Facility-Administered Medications:    acetaminophen (TYLENOL) tablet 650 mg, 650 mg, Oral, Q6H PRN **OR** acetaminophen (TYLENOL) suppository 650 mg, 650 mg, Rectal, Q6H PRN, Johnson, Clanford L, MD   fentaNYL (SUBLIMAZE) injection 12.5-25 mcg, 12.5-25 mcg, Intravenous, Q2H PRN, Johnson, Clanford L, MD   insulin aspart (novoLOG) injection 0-6 Units, 0-6 Units, Subcutaneous, TID WC, Johnson, Clanford L, MD, 1 Units at 04/01/23 0758    lactated ringers infusion, , Intravenous, Continuous, Johnson, Clanford L, MD, Last Rate: 40 mL/hr at 04/01/23 0811, New Bag at 04/01/23 0811   ondansetron (ZOFRAN) tablet 4 mg, 4 mg, Oral, Q6H PRN **OR** ondansetron (ZOFRAN) injection 4 mg, 4 mg, Intravenous, Q6H PRN, Johnson, Clanford L, MD   [COMPLETED] pantoprazole (PROTONIX) injection 40 mg, 40 mg, Intravenous, Q5 min, 40 mg at 04/01/23 0305 **FOLLOWED BY** pantoprazole (PROTONIX) injection 40 mg, 40 mg, Intravenous, Q6H, 40 mg at 04/01/23 0752 **FOLLOWED BY** [START ON 04/04/2023] pantoprazole (PROTONIX) injection 40 mg, 40 mg, Intravenous, Q12H, Johnson, Clanford L, MD   prochlorperazine (COMPAZINE) injection 10 mg, 10 mg, Intravenous, Q4H PRN, Johnson, Clanford L, MD  Current Outpatient Medications:    aspirin 81 MG chewable tablet, Chew 81 mg by mouth daily., Disp: , Rfl:    azelastine (ASTELIN) 0.1 % nasal spray, Place 1 spray into both nostrils 2 (two) times daily. Use in each nostril as directed, Disp: 30 mL, Rfl: 2   cyclobenzaprine (FLEXERIL) 5 MG tablet, Take 1 tablet (5 mg total) by mouth 3 (three) times daily as needed for muscle spasms. Do not drink alcohol or drive while taking this medication, may cause drowsiness, Disp: 15 tablet, Rfl: 0   doxycycline (VIBRAMYCIN) 100 MG capsule, Take 1 capsule (100 mg total) by mouth 2 (two) times daily., Disp: 20 capsule, Rfl: 0   Echinacea 400 MG CAPS, Take by mouth., Disp: , Rfl:    escitalopram (LEXAPRO) 5 MG tablet, Take 5 mg by mouth daily., Disp: , Rfl:    fluticasone (FLONASE) 50 MCG/ACT nasal spray, Place 2 sprays into both nostrils daily., Disp: 16 g, Rfl: 6   Glucosamine 500 MG CAPS, Take by mouth., Disp: , Rfl:    LORazepam (ATIVAN) 1 MG tablet, Take 1 tablet (1 mg total) by mouth daily as needed for anxiety. No refills until 09/29/2017, Disp: 90 tablet, Rfl: 1   metFORMIN (GLUCOPHAGE-XR) 500 MG 24 hr tablet, TAKE 1 TABLET(500 MG) BY MOUTH DAILY WITH BREAKFAST, Disp: 30 tablet, Rfl:  0   omega-3 acid ethyl esters (LOVAZA) 1 g capsule, Take 1 g by mouth 2 (two) times daily., Disp: , Rfl:    traMADol (ULTRAM) 50 MG tablet, Take 1 tablet (50 mg total) by mouth every 6 (six) hours as needed., Disp: 15 tablet, Rfl: 0   Zinc Sulfate (ZINC-220 PO), Take by mouth., Disp: , Rfl:  Allergies: Codeine, Other, Sulfonamide derivatives, Pedi-pre tape spray [wound dressing adhesive], Penicillins, and Tape  Complete Review of Systems: GENERAL: negative for malaise, night sweats HEENT: No changes in hearing or vision, no nose bleeds or other nasal problems. NECK: Negative for lumps, goiter, pain and significant neck swelling RESPIRATORY:  Negative for cough, wheezing CARDIOVASCULAR: Negative for chest pain, leg swelling, palpitations, orthopnea GI: SEE HPI MUSCULOSKELETAL: Negative for joint pain or swelling, back pain, and muscle pain. SKIN: Negative for lesions, rash PSYCH: Negative for sleep disturbance, mood disorder and recent psychosocial stressors. HEMATOLOGY Negative for prolonged bleeding, bruising easily, and swollen nodes. ENDOCRINE: Negative for cold or heat intolerance, polyuria, polydipsia and goiter. NEURO: negative for tremor, gait imbalance, syncope and seizures. The remainder of the review of systems is noncontributory.  Physical Exam: BP 120/66   Pulse (!) 102   Temp 97.6 F (36.4 C) (Oral)   Resp 19   Ht 5\' 8"  (1.727 m)   Wt 76 kg   SpO2 95%   BMI 25.48 kg/m  GENERAL: The patient is AO x3, in no acute distress. HEENT: Head is normocephalic and atraumatic. EOMI are intact. Mouth is well hydrated and without lesions. NECK: Supple. No masses LUNGS: Clear to auscultation. No presence of rhonchi/wheezing/rales. Adequate chest expansion HEART: RRR, normal s1 and s2. ABDOMEN: Soft, nontender, no guarding, no peritoneal signs, and nondistended. BS +. No masses. EXTREMITIES: Without any cyanosis, clubbing, rash, lesions or edema. NEUROLOGIC: AOx3, no focal motor  deficit. SKIN: no jaundice, no rashes  Laboratory Data CBC:     Component Value Date/Time   WBC 8.8 04/01/2023 0753   RBC 3.14 (L) 04/01/2023 0753   HGB 10.2 (L) 04/01/2023 0753   HGB 13.3 08/28/2017 1007   HCT 30.3 (L) 04/01/2023 0753   HCT 38.6 08/28/2017 1007   PLT 142 (L) 04/01/2023 0753   PLT 260 08/28/2017 1007   MCV 96.5 04/01/2023 0753   MCV 87 08/28/2017 1007   MCH 32.5 04/01/2023 0753   MCHC 33.7 04/01/2023 0753   RDW 13.9 04/01/2023 0753   RDW 14.1 08/28/2017 1007   LYMPHSABS 1.7 08/28/2017 1007   MONOABS 671 08/22/2015 1549   EOSABS 0.2 08/28/2017 1007   BASOSABS 0.0 08/28/2017 1007   COAG:  Lab Results  Component Value Date   INR 1.2 04/01/2023    BMP:     Latest Ref Rng & Units 04/01/2023    2:49 AM 08/28/2017   10:07 AM 02/01/2016    5:27 PM  BMP  Glucose 70 - 99 mg/dL 846  962  952   BUN 8 - 23 mg/dL 39  16  15   Creatinine 0.44 - 1.00 mg/dL 8.41  3.24  4.01   BUN/Creat Ratio 12 - 28  17  17    Sodium 135 - 145 mmol/L 139  139  142   Potassium 3.5 - 5.1 mmol/L 3.5  4.5  3.7   Chloride 98 - 111 mmol/L 105  103  100   CO2 22 - 32 mmol/L 24  25  25    Calcium 8.9 - 10.3 mg/dL 8.4  9.6  8.9     HEPATIC:     Latest Ref Rng & Units 04/01/2023    2:49 AM 08/28/2017   10:07 AM 08/07/2017   11:04 AM  Hepatic Function  Total Protein 6.5 - 8.1 g/dL 6.7  7.2  7.5   Albumin 3.5 - 5.0 g/dL 3.2  4.4  4.6   AST 15 - 41 U/L 28  16  19    ALT 0 - 44 U/L 24  15  17    Alk Phosphatase 38 - 126 U/L 105  84  86   Total Bilirubin 0.0 - 1.2 mg/dL 1.7  0.3  0.3   Bilirubin, Direct 0.00 - 0.40 mg/dL  0.09  0.09     CARDIAC:  Lab Results  Component Value Date   CKTOTAL 168 08/22/2015     Imaging: I personally reviewed and interpreted the available imaging.  Assessment & Plan:   This is a 73 y.o. female with history of diabetes, hyperlipidemia, history of Lyme disease presented with nausea and vomiting.  GI is consulted for episodes of hematemesis  #Nausea  vomiting with Hematemesis  This is likely Mallory-Weiss tear in setting of vomiting and retching.  Could be esophagitis gastritis or peptic ulcer disease  Patient takes ibuprofen so could be NSAID induced PUD   Patient had witnessed hematemesis in hospital and also evident by her lab values for unit drop in hemoglobin with elevated BUN/creatinine ratio more than 30 consistent with upper GI bleed  Plan for upper endoscopy today with possible control of bleeding PPI IV twice daily Keep n.p.o. 2 large-bore IV lines and trend hemoglobin Transfuse to keep hemoglobin more than 7   Vista Lawman, MD Gastroenterology and Hepatology [facility] Gastroenterology  This chart has been completed using [facility] Dictation software, and while attempts have been made to ensure accuracy , certain words and phrases may not be transcribed as intended

## 2023-04-01 NOTE — Anesthesia Preprocedure Evaluation (Signed)
 Anesthesia Evaluation  Patient identified by MRN, date of birth, ID band Patient awake    Reviewed: Allergy & Precautions, H&P , NPO status , Patient's Chart, lab work & pertinent test results  Airway Mallampati: II  TM Distance: >3 FB Neck ROM: Full    Dental no notable dental hx.    Pulmonary neg pulmonary ROS, former smoker   Pulmonary exam normal breath sounds clear to auscultation       Cardiovascular negative cardio ROS Normal cardiovascular exam Rhythm:Regular Rate:Normal     Neuro/Psych negative neurological ROS  negative psych ROS   GI/Hepatic negative GI ROS, Neg liver ROS,,,  Endo/Other  negative endocrine ROSdiabetes    Renal/GU negative Renal ROS  negative genitourinary   Musculoskeletal negative musculoskeletal ROS (+)    Abdominal   Peds negative pediatric ROS (+)  Hematology negative hematology ROS (+)   Anesthesia Other Findings   Reproductive/Obstetrics negative OB ROS                             Anesthesia Physical Anesthesia Plan  ASA: 2  Anesthesia Plan: General   Post-op Pain Management:    Induction: Intravenous  PONV Risk Score and Plan:   Airway Management Planned:   Additional Equipment:   Intra-op Plan:   Post-operative Plan: Extubation in OR  Informed Consent: I have reviewed the patients History and Physical, chart, labs and discussed the procedure including the risks, benefits and alternatives for the proposed anesthesia with the patient or authorized representative who has indicated his/her understanding and acceptance.     Dental advisory given  Plan Discussed with: CRNA  Anesthesia Plan Comments:        Anesthesia Quick Evaluation

## 2023-04-01 NOTE — Anesthesia Postprocedure Evaluation (Signed)
 Anesthesia Post Note  Patient: DEB LOUDIN  Procedure(s) Performed: ESOPHAGOGASTRODUODENOSCOPY (EGD) WITH PROPOFOL  Patient location during evaluation: PACU Anesthesia Type: General Level of consciousness: awake and alert Pain management: pain level controlled Vital Signs Assessment: post-procedure vital signs reviewed and stable Respiratory status: spontaneous breathing, nonlabored ventilation and respiratory function stable Cardiovascular status: blood pressure returned to baseline and stable Postop Assessment: no apparent nausea or vomiting Anesthetic complications: no  No notable events documented.   Last Vitals:  Vitals:   04/01/23 1422 04/01/23 1430  BP: (!) 151/89 (!) 143/70  Pulse: 100 99  Resp: 18 18  Temp: 37.2 C   SpO2: 97% 98%    Last Pain:  Vitals:   04/01/23 1430  TempSrc:   PainSc: 0-No pain                 Roslynn Amble

## 2023-04-01 NOTE — Plan of Care (Signed)

## 2023-04-01 NOTE — ED Triage Notes (Signed)
 Pt states she is here for vomiting blood. States it is dark in color. Reports it started around 2200 last night and that she has had approximately 6 episodes. Reports feeling dizzy since arrival to ED.

## 2023-04-02 DIAGNOSIS — D696 Thrombocytopenia, unspecified: Secondary | ICD-10-CM | POA: Diagnosis not present

## 2023-04-02 DIAGNOSIS — I8501 Esophageal varices with bleeding: Secondary | ICD-10-CM | POA: Diagnosis not present

## 2023-04-02 DIAGNOSIS — D62 Acute posthemorrhagic anemia: Secondary | ICD-10-CM | POA: Diagnosis not present

## 2023-04-02 DIAGNOSIS — E119 Type 2 diabetes mellitus without complications: Secondary | ICD-10-CM | POA: Diagnosis not present

## 2023-04-02 DIAGNOSIS — R933 Abnormal findings on diagnostic imaging of other parts of digestive tract: Secondary | ICD-10-CM | POA: Diagnosis not present

## 2023-04-02 DIAGNOSIS — I8511 Secondary esophageal varices with bleeding: Secondary | ICD-10-CM | POA: Diagnosis not present

## 2023-04-02 DIAGNOSIS — K7460 Unspecified cirrhosis of liver: Secondary | ICD-10-CM | POA: Diagnosis not present

## 2023-04-02 LAB — CBC
HCT: 26.2 % — ABNORMAL LOW (ref 36.0–46.0)
HCT: 26.8 % — ABNORMAL LOW (ref 36.0–46.0)
Hemoglobin: 8.5 g/dL — ABNORMAL LOW (ref 12.0–15.0)
Hemoglobin: 8.8 g/dL — ABNORMAL LOW (ref 12.0–15.0)
MCH: 31.6 pg (ref 26.0–34.0)
MCH: 32.6 pg (ref 26.0–34.0)
MCHC: 32.4 g/dL (ref 30.0–36.0)
MCHC: 32.8 g/dL (ref 30.0–36.0)
MCV: 97.4 fL (ref 80.0–100.0)
MCV: 99.3 fL (ref 80.0–100.0)
Platelets: 124 10*3/uL — ABNORMAL LOW (ref 150–400)
Platelets: 143 10*3/uL — ABNORMAL LOW (ref 150–400)
RBC: 2.69 MIL/uL — ABNORMAL LOW (ref 3.87–5.11)
RBC: 2.7 MIL/uL — ABNORMAL LOW (ref 3.87–5.11)
RDW: 14.2 % (ref 11.5–15.5)
RDW: 14.3 % (ref 11.5–15.5)
WBC: 6.5 10*3/uL (ref 4.0–10.5)
WBC: 7.3 10*3/uL (ref 4.0–10.5)
nRBC: 0 % (ref 0.0–0.2)
nRBC: 0 % (ref 0.0–0.2)

## 2023-04-02 LAB — COMPREHENSIVE METABOLIC PANEL
ALT: 23 U/L (ref 0–44)
AST: 30 U/L (ref 15–41)
Albumin: 3 g/dL — ABNORMAL LOW (ref 3.5–5.0)
Alkaline Phosphatase: 84 U/L (ref 38–126)
Anion gap: 6 (ref 5–15)
BUN: 33 mg/dL — ABNORMAL HIGH (ref 8–23)
CO2: 25 mmol/L (ref 22–32)
Calcium: 8 mg/dL — ABNORMAL LOW (ref 8.9–10.3)
Chloride: 109 mmol/L (ref 98–111)
Creatinine, Ser: 0.74 mg/dL (ref 0.44–1.00)
GFR, Estimated: >60 mL/min (ref >60)
Glucose, Bld: 157 mg/dL — ABNORMAL HIGH (ref 70–99)
Potassium: 3.8 mmol/L (ref 3.5–5.1)
Sodium: 140 mmol/L (ref 135–145)
Total Bilirubin: 0.9 mg/dL (ref 0.0–1.2)
Total Protein: 5.6 g/dL — ABNORMAL LOW (ref 6.5–8.1)

## 2023-04-02 LAB — GLUCOSE, CAPILLARY
Glucose-Capillary: 129 mg/dL — ABNORMAL HIGH (ref 70–99)
Glucose-Capillary: 134 mg/dL — ABNORMAL HIGH (ref 70–99)
Glucose-Capillary: 145 mg/dL — ABNORMAL HIGH (ref 70–99)
Glucose-Capillary: 149 mg/dL — ABNORMAL HIGH (ref 70–99)
Glucose-Capillary: 98 mg/dL (ref 70–99)

## 2023-04-02 LAB — HEPATITIS PANEL, ACUTE
HCV Ab: NONREACTIVE
Hep A IgM: NONREACTIVE
Hep B C IgM: NONREACTIVE
Hepatitis B Surface Ag: NONREACTIVE

## 2023-04-02 LAB — PROTIME-INR
INR: 1.3 — ABNORMAL HIGH (ref 0.8–1.2)
Prothrombin Time: 16.6 s — ABNORMAL HIGH (ref 11.4–15.2)

## 2023-04-02 LAB — MAGNESIUM: Magnesium: 1.9 mg/dL (ref 1.7–2.4)

## 2023-04-02 MED ORDER — LORAZEPAM 0.5 MG PO TABS
0.5000 mg | ORAL_TABLET | ORAL | Status: DC | PRN
  Administered 2023-04-02 (×2): 0.5 mg via ORAL
  Filled 2023-04-02 (×2): qty 1

## 2023-04-02 MED ORDER — EMPAGLIFLOZIN 10 MG PO TABS
10.0000 mg | ORAL_TABLET | Freq: Every day | ORAL | Status: DC
Start: 2023-04-02 — End: 2023-04-04
  Administered 2023-04-02 – 2023-04-04 (×3): 10 mg via ORAL
  Filled 2023-04-02 (×3): qty 1

## 2023-04-02 MED ORDER — VITAMIN B-12 100 MCG PO TABS
500.0000 ug | ORAL_TABLET | Freq: Every day | ORAL | Status: DC
  Administered 2023-04-02 – 2023-04-04 (×3): 500 ug via ORAL
  Filled 2023-04-02 (×3): qty 5

## 2023-04-02 MED ORDER — ADULT MULTIVITAMIN W/MINERALS CH
1.0000 | ORAL_TABLET | Freq: Every day | ORAL | Status: DC
Start: 2023-04-02 — End: 2023-04-04
  Administered 2023-04-02 – 2023-04-04 (×3): 1 via ORAL
  Filled 2023-04-02 (×3): qty 1

## 2023-04-02 NOTE — Progress Notes (Signed)
 PROGRESS NOTE   VIVI PICCIRILLI  QIH:[id number] DOB: [birth year] DOA: 04/01/2023 PCP: Ofilia Neas, PA-C   Chief Complaint  Patient presents with   Hematemesis   Level of care: Stepdown  Brief Admission History:  73 year old female with controlled type 2 diabetes mellitus, hyperlipidemia with statin intolerance, allergic rhinitis, history of Lyme disease, Mnire's disease of left ear, adjustment disorder with anxiety started vomiting and feeling ill after having shrimp for supper.  She reportedly vomited multiple times at around 10 PM and started seeing blood in the emesis.  She is not having any abdominal pain.  She is having dizziness.  She denies chest pain or shortness of breath.  She is taking daily aspirin.  Patient was started on a pantoprazole protocol and given IV Zofran in the ED, hemoglobin noted to be 13 down to 11.  She also was noted to have an elevated BUN of 39.  GI was consulted and requested patient be kept n.p.o. and have a repeat CBC in the morning and likely endoscopy on 04/01/2023.   Assessment and Plan:  Acute esophageal variceal bleed  - pt was taken for EGD on 2/15 and bleeding varices was banded  - pt remains on 72 hours of IV octreotide in the ICU - follow CBC every 8 hours  - IV pantoprazole ordered - IV ceftriaxone 1 gram daily ordered - Hg down to 8.5 this morning - starting clear liquid diet this morning per GI recommendation - CT abdomen pelvis with findings of liver cirrhosis, no portal vein thrombus - BUN elevated but trending down, recheck in AM  - hepatitis serologies pending - GI planning to DC home on carvedilol  Type 2 DM controlled  - as evidenced by A1c 6.8%  - resume SSI coverage and CBG monitoring - continue daily home empagliflozin 10 mg   CBG (last 3)  Recent Labs    04/02/23 0002 04/02/23 0430 04/02/23 0738  GLUCAP 134* 129* 149*   DVT prophylaxis: scds Code Status: Full  Family Communication: husband bedside 2/15, 2/16   Disposition: anticipate home with husband   Consultants:  GI  Procedures:  EGD 2/15 with Dr. Tasia Catchings   Antimicrobials:    Subjective: Pt reports she is tolerating clears but having some pain in epigastrium. No vomiting.   Objective: Vitals:   04/02/23 0700 04/02/23 0800 04/02/23 0900 04/02/23 1000  BP: 128/60   130/65  Pulse: 89 99 91 95  Resp: 14 15 (!) 22 19  Temp:      TempSrc:      SpO2: 91% 95% 94% 96%  Weight:      Height:        Intake/Output Summary (Last 24 hours) at 04/02/2023 1138 Last data filed at 04/02/2023 0300 Gross per 24 hour  Intake 1987.46 ml  Output 300 ml  Net 1687.46 ml   Filed Weights   04/01/23 0247 04/01/23 1514  Weight: 76 kg 75.3 kg   Examination:  General exam: Appears calm and comfortable  Respiratory system: Clear to auscultation. Respiratory effort normal. Cardiovascular system: normal S1 & S2 heard. No JVD, murmurs, rubs, gallops or clicks. No pedal edema. Gastrointestinal system: Abdomen is nondistended, soft and nontender. No organomegaly or masses felt. Normal bowel sounds heard. Central nervous system: Alert and oriented. No focal neurological deficits. Extremities: Symmetric 5 x 5 power. Skin: No rashes, lesions or ulcers. Psychiatry: Judgement and insight appear normal. Mood & affect appropriate.   Data Reviewed: I have personally reviewed following labs and  imaging studies  CBC: Recent Labs  Lab 04/01/23 0249 04/01/23 0753 04/01/23 1659 04/02/23 0615  WBC 9.3 8.8 9.8 6.5  HGB 11.3* 10.2* 9.7* 8.5*  HCT 35.1* 30.3* 30.3* 26.2*  MCV 97.0 96.5 98.4 97.4  PLT 176 142* 138* 124*    Basic Metabolic Panel: Recent Labs  Lab 04/01/23 0249 04/02/23 0615  NA 139 140  K 3.5 3.8  CL 105 109  CO2 24 25  GLUCOSE 209* 157*  BUN 39* 33*  CREATININE 0.76 0.74  CALCIUM 8.4* 8.0*  MG  --  1.9    CBG: Recent Labs  Lab 04/01/23 1538 04/01/23 2049 04/02/23 0002 04/02/23 0430 04/02/23 0738  GLUCAP 147* 142* 134* 129*  149*    Recent Results (from the past 240 hours)  MRSA Next Gen by PCR, Nasal     Status: None   Collection Time: 04/01/23  3:00 PM   Specimen: Nasal Mucosa; Nasal Swab  Result Value Ref Range Status   MRSA by PCR Next Gen NOT DETECTED NOT DETECTED Final    Comment: (NOTE) The GeneXpert MRSA Assay (FDA approved for NASAL specimens only), is one component of a comprehensive MRSA colonization surveillance program. It is not intended to diagnose MRSA infection nor to guide or monitor treatment for MRSA infections. Test performance is not FDA approved in patients less than 71 years old. Performed at [facility], [address] [place], [place] [address]      Radiology Studies: CT ABDOMEN PELVIS W CONTRAST Result Date: 04/01/2023 CLINICAL DATA:  Portal hypertension and cirrhosis. Evaluate for portal vein thrombosis. Patient reports hematemesis. EXAM: CT ABDOMEN AND PELVIS WITH CONTRAST TECHNIQUE: Multidetector CT imaging of the abdomen and pelvis was performed using the standard protocol following bolus administration of intravenous contrast. RADIATION DOSE REDUCTION: This exam was performed according to the departmental dose-optimization program which includes automated exposure control, adjustment of the mA and/or kV according to patient size and/or use of iterative reconstruction technique. CONTRAST:  OMNIPAQUE IOHEXOL 300 MG/ML  SOLN COMPARISON:  Ultrasound 01/21/2021 FINDINGS: Lower chest: Wall thickening of the distal esophagus with areas of esophageal enhancement. No pleural effusion. Hepatobiliary: Cirrhotic hepatic morphology with nodular contours. Decreased hepatic density. No discrete focal hepatic lesion. Mild diffuse gallbladder wall thickening without abnormal gallbladder distension. No calcified gallstone. No biliary dilatation. The portal, splenic, and mesenteric veins are patent. Pancreas: No ductal dilatation or inflammation. Spleen: Normal in size without focal abnormality.  Adrenals/Urinary Tract: No adrenal nodule. Cortical scarring in the upper and lower right kidney. Nonobstructing upper right renal stone. Right upper pole calyceal diverticulum. There is homogeneous renal enhancement with symmetric excretion on delayed phase imaging. Partially distended urinary bladder, normal for degree of distension. Stomach/Bowel: Distal esophageal wall thickening with areas of enhancement. The stomach is nondistended. No small bowel obstruction. Equivocal areas of bowel wall thickening within the distal small bowel, series 2, image 72. Normal appendix. Colonic diverticulosis without focal diverticulitis. There is mild wall thickening at the splenic flexure of the colon, series 2, image 22 in descending colon series 2 image 45. Vascular/Lymphatic: The portal, splenic, and mesenteric veins are patent. No portal vein thrombus. Aortic atherosclerosis without aneurysm. 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 Prominent 13 mm porta hepatis node series 2, image 23. Prominent 11 mm portal caval node series 2, image 27. Reproductive: Calcifications in the lower uterus may represent lower uterine fibroids. No adnexal mass. Other: No ascites.  Diminutive fat containing umbilical hernia. Musculoskeletal:  Prominent Schmorl's node inferior endplate of L4. Lower lumbar facet hypertrophy. The bones are under mineralized. There are no acute or suspicious osseous abnormalities. IMPRESSION: 1. Cirrhosis. No portal vein thrombus. 2. Wall thickening of the distal esophagus with areas of enhancement. This may represent esophageal varices. 3. Equivocal areas of bowel wall thickening within the distal small bowel, splenic flexure, and descending colon. Recommend correlation with any symptoms of enterocolitis. 4. Colonic diverticulosis without focal diverticulitis. 5. Nonobstructing right renal stone. Aortic Atherosclerosis (ICD10-I70.0). Electronically Signed   By: Narda Rutherford M.D.   On:  04/01/2023 16:55    Scheduled Meds:  Chlorhexidine Gluconate Cloth  6 each Topical Daily   empagliflozin  10 mg Oral Daily   insulin aspart  0-6 Units Subcutaneous TID WC   multivitamin with minerals  1 tablet Oral Daily   pantoprazole (PROTONIX) IV  40 mg Intravenous Q12H   cyanocobalamin  500 mcg Oral Daily   Continuous Infusions:  cefTRIAXone (ROCEPHIN)  IV 1 g (04/01/23 1521)   octreotide (SANDOSTATIN) 500 mcg in sodium chloride 0.9 % 250 mL (2 mcg/mL) infusion 50 mcg/hr (04/01/23 1541)    LOS: 1 day   Critical Care Procedure Note Authorized and Performed by: Maryln Manuel MD  Total Critical Care time:  50 mins Due to a high probability of clinically significant, life threatening deterioration, the patient required my highest level of preparedness to intervene emergently and I personally spent this critical care time directly and personally managing the patient.  This critical care time included obtaining a history; examining the patient, pulse oximetry; ordering and review of studies; arranging urgent treatment with development of a management plan; evaluation of patient's response of treatment; frequent reassessment; and discussions with other providers.  This critical care time was performed to assess and manage the high probability of imminent and life threatening deterioration that could result in multi-organ failure.  It was exclusive of separately billable procedures and treating other patients and teaching time.   Standley Dakins, MD How to contact the [facility] Attending or Consulting provider 7A - 7P or covering provider during after hours 7P -7A, for this patient?  Check the care team in [facility] and look for a) attending/consulting [facility] provider listed and b) the [facility] team listed Log into [email or web address] to find provider on call.  Locate the [facility] provider you are looking for under [facility] and page to a number that you can be directly reached. If you still have difficulty reaching  the provider, please page the [facility] (Director on Call) for the Hospitalists listed on amion for assistance.  04/02/2023, 11:38 AM

## 2023-04-02 NOTE — Progress Notes (Addendum)
 Gastroenterology & Hepatology   Interval History: Patient is doing well after EGD with banding, denies any further hematemesis and have not had any bowel movement.  Is able to tolerate clear liquid diet without any nausea vomiting  Patient denies any heavy alcohol use previously but is concerned if history of Lyme disease could have caused cirrhosis which I explained is unlikely  Inpatient Medications:  Current Facility-Administered Medications:    acetaminophen (TYLENOL) tablet 650 mg, 650 mg, Oral, Q6H PRN **OR** acetaminophen (TYLENOL) suppository 650 mg, 650 mg, Rectal, Q6H PRN, Johnson, Clanford L, MD   cefTRIAXone (ROCEPHIN) 1 g in sodium chloride 0.9 % 100 mL IVPB, 1 g, Intravenous, Q24H, Johnson, Clanford L, MD, Last Rate: 200 mL/hr at 04/01/23 1521, 1 g at 04/01/23 1521   Chlorhexidine Gluconate Cloth 2 % PADS 6 each, 6 each, Topical, Daily, Laural Benes, Clanford L, MD, 6 each at 04/02/23 0912   empagliflozin (JARDIANCE) tablet 10 mg, 10 mg, Oral, Daily, Johnson, Clanford L, MD, 10 mg at 04/02/23 1036   fentaNYL (SUBLIMAZE) injection 12.5-25 mcg, 12.5-25 mcg, Intravenous, Q2H PRN, Laural Benes, Clanford L, MD, 25 mcg at 04/01/23 2050   insulin aspart (novoLOG) injection 0-6 Units, 0-6 Units, Subcutaneous, TID WC, Johnson, Clanford L, MD, 1 Units at 04/01/23 0758   LORazepam (ATIVAN) tablet 0.5 mg, 0.5 mg, Oral, Q4H PRN, Laural Benes, Clanford L, MD, 0.5 mg at 04/02/23 1036   multivitamin with minerals tablet 1 tablet, 1 tablet, Oral, Daily, Johnson, Clanford L, MD, 1 tablet at 04/02/23 1036   octreotide (SANDOSTATIN) 500 mcg in sodium chloride 0.9 % 250 mL (2 mcg/mL) infusion, 50 mcg/hr, Intravenous, Continuous, Johnson, Clanford L, MD, Last Rate: 25 mL/hr at 04/02/23 1229, 50 mcg/hr at 04/02/23 1229   ondansetron (ZOFRAN) tablet 4 mg, 4 mg, Oral, Q6H PRN **OR** ondansetron (ZOFRAN) injection 4 mg, 4 mg, Intravenous, Q6H PRN, Laural Benes, Clanford L, MD, 4 mg at 04/01/23 1409   pantoprazole (PROTONIX)  injection 40 mg, 40 mg, Intravenous, Q12H, Johnson, Clanford L, MD, 40 mg at 04/02/23 6295   prochlorperazine (COMPAZINE) injection 10 mg, 10 mg, Intravenous, Q4H PRN, Laural Benes, Clanford L, MD, 10 mg at 04/01/23 1644   vitamin B-12 (CYANOCOBALAMIN) tablet 500 mcg, 500 mcg, Oral, Daily, Johnson, Clanford L, MD, 500 mcg at 04/02/23 1035   I/O    Intake/Output Summary (Last 24 hours) at 04/02/2023 1500 Last data filed at 04/02/2023 0300 Gross per 24 hour  Intake 729.66 ml  Output 300 ml  Net 429.66 ml     Physical Exam: Temp:  [98.4 F (36.9 C)-98.7 F (37.1 C)] 98.6 F (37 C) (02/16 0500) Pulse Rate:  [86-106] 87 (02/16 1200) Resp:  [11-23] 16 (02/16 1200) BP: (111-158)/(43-97) 132/97 (02/16 1200) SpO2:  [91 %-100 %] 97 % (02/16 1200) Weight:  [75.3 kg] 75.3 kg (02/15 1514)  Temp (24hrs), Avg:98.6 F (37 C), Min:98.4 F (36.9 C), Max:98.7 F (37.1 C)  GENERAL: The patient is AO x3, in no acute distress. HEENT: Head is normocephalic and atraumatic. EOMI are intact. Mouth is well hydrated and without lesions. NECK: Supple. No masses LUNGS: Clear to auscultation. No presence of rhonchi/wheezing/rales. Adequate chest expansion HEART: RRR, normal s1 and s2. ABDOMEN: Soft, nontender, no guarding, no peritoneal signs, and nondistended. BS +. No masses.   Laboratory Data: CBC:     Component Value Date/Time   WBC 6.5 04/02/2023 0615   RBC 2.69 (L) 04/02/2023 0615   HGB 8.5 (L) 04/02/2023 0615   HGB 13.3 08/28/2017 1007  HCT 26.2 (L) 04/02/2023 0615   HCT 38.6 08/28/2017 1007   PLT 124 (L) 04/02/2023 0615   PLT 260 08/28/2017 1007   MCV 97.4 04/02/2023 0615   MCV 87 08/28/2017 1007   MCH 31.6 04/02/2023 0615   MCHC 32.4 04/02/2023 0615   RDW 14.2 04/02/2023 0615   RDW 14.1 08/28/2017 1007   LYMPHSABS 1.7 08/28/2017 1007   MONOABS 671 08/22/2015 1549   EOSABS 0.2 08/28/2017 1007   BASOSABS 0.0 08/28/2017 1007   COAG:  Lab Results  Component Value Date   INR 1.3 (H)  04/02/2023   INR 1.2 04/01/2023    BMP:     Latest Ref Rng & Units 04/02/2023    6:15 AM 04/01/2023    2:49 AM 08/28/2017   10:07 AM  BMP  Glucose 70 - 99 mg/dL 440  102  725   BUN 8 - 23 mg/dL 33  39  16   Creatinine 0.44 - 1.00 mg/dL 3.66  4.40  3.47   BUN/Creat Ratio 12 - 28   17   Sodium 135 - 145 mmol/L 140  139  139   Potassium 3.5 - 5.1 mmol/L 3.8  3.5  4.5   Chloride 98 - 111 mmol/L 109  105  103   CO2 22 - 32 mmol/L 25  24  25    Calcium 8.9 - 10.3 mg/dL 8.0  8.4  9.6     HEPATIC:     Latest Ref Rng & Units 04/02/2023    6:15 AM 04/01/2023    2:49 AM 08/28/2017   10:07 AM  Hepatic Function  Total Protein 6.5 - 8.1 g/dL 5.6  6.7  7.2   Albumin 3.5 - 5.0 g/dL 3.0  3.2  4.4   AST 15 - 41 U/L 30  28  16    ALT 0 - 44 U/L 23  24  15    Alk Phosphatase 38 - 126 U/L 84  105  84   Total Bilirubin 0.0 - 1.2 mg/dL 0.9  1.7  0.3   Bilirubin, Direct 0.00 - 0.40 mg/dL   4.25     CARDIAC:  Lab Results  Component Value Date   CKTOTAL 168 08/22/2015      Imaging: I personally reviewed and interpreted the available labs, imaging and endoscopic files.     IMPRESSION: 1. Cirrhosis. No portal vein thrombus. 2. Wall thickening of the distal esophagus with areas of enhancement. This may represent esophageal varices. 3. Equivocal areas of bowel wall thickening within the distal small bowel, splenic flexure, and descending colon. Recommend correlation with any symptoms of enterocolitis. 4. Colonic diverticulosis without focal diverticulitis. 5. Nonobstructing right renal stone.   Assessment/Plan:  This is a 73 y.o. female with history of diabetes, hyperlipidemia, history of Lyme disease presented with nausea and vomiting.  GI is consulted for episodes of hematemesis .  Patient underwent EGD found to have large varices with stigmata of recent bleed status post banding.  Patient found to have new diagnosis of cirrhosis  #Decompensated cirrhosis with EV bleed MELD 3.0: 11 at  04/02/2023  6:15 AM MELD-Na: 9 at 04/02/2023  6:15 AM Calculated from: Serum Creatinine: 0.74 mg/dL (Using min of 1 mg/dL) at 9/56/3875  6:43 AM Serum Sodium: 140 mmol/L (Using max of 137 mmol/L) at 04/02/2023  6:15 AM Total Bilirubin: 0.9 mg/dL (Using min of 1 mg/dL) at 05/13/5186  4:16 AM Serum Albumin: 3 g/dL at 07/21/3014  0:10 AM INR(ratio): 1.3 at 04/02/2023  6:15 AM  Age at listing (hypothetical): 83 years Sex: Female at 04/02/2023  6:15 AM  #Eitology : Unknown but could be due to fatty liver disease as seen on ultrasound in previous years.  Hep B nonimmune, hep B surface antigen negative hep B surface core antibody negative hep C negative  # Hepatic encephalopathy -None - Avoid opiates or benzodiazepines   # Ascites: None on exam and imaging   # Esophageal varices: Large 4 columns banded (2/15)   # HCC screening - No focal lesion on imaging. Would need lifelong HCC Screening protocol with [place] + AFP q 6 months  Recommendation :  -PPI IV BID for 3 days than OD oral for 4- 6 weeks  -Send hep B surface antibody, ASMA, AMA, ANA, HIV, ferritin -AFP - Can advance diet tomorrow -Continue octreotide for total of 72 hours -Ceftriaxone for 7 days , or equivalent and upon discharge -Would need Carvedilol (NSBB) upon discharge to uptitrate to a goal of HR: 55- 60 bpm and SBP>  -Repeat Upper endoscopy in 4- 6 weeks for surveillance of bleeding varices  -If Significant rebleeding is encountered would obtain IR consult for TIPS   #Abnormal CT   Patient had large bowel and small bowel thickening which could be underdistention or edema.  Can discuss colonoscopy as outpatient  Vista Lawman, MD Gastroenterology and Hepatology [facility] Gastroenterology   This chart has been completed using [facility] Dictation software, and while attempts have been made to ensure accuracy , certain words and phrases may not be transcribed as intended

## 2023-04-02 NOTE — Plan of Care (Signed)

## 2023-04-03 ENCOUNTER — Encounter (HOSPITAL_COMMUNITY): Payer: Self-pay | Admitting: Gastroenterology

## 2023-04-03 DIAGNOSIS — K7469 Other cirrhosis of liver: Secondary | ICD-10-CM

## 2023-04-03 DIAGNOSIS — D62 Acute posthemorrhagic anemia: Secondary | ICD-10-CM | POA: Diagnosis not present

## 2023-04-03 DIAGNOSIS — I8511 Secondary esophageal varices with bleeding: Secondary | ICD-10-CM | POA: Diagnosis not present

## 2023-04-03 DIAGNOSIS — I8501 Esophageal varices with bleeding: Secondary | ICD-10-CM | POA: Diagnosis not present

## 2023-04-03 DIAGNOSIS — K7460 Unspecified cirrhosis of liver: Secondary | ICD-10-CM

## 2023-04-03 DIAGNOSIS — K922 Gastrointestinal hemorrhage, unspecified: Secondary | ICD-10-CM | POA: Diagnosis not present

## 2023-04-03 LAB — COMPREHENSIVE METABOLIC PANEL
ALT: 25 U/L (ref 0–44)
AST: 36 U/L (ref 15–41)
Albumin: 2.8 g/dL — ABNORMAL LOW (ref 3.5–5.0)
Alkaline Phosphatase: 77 U/L (ref 38–126)
Anion gap: 5 (ref 5–15)
BUN: 19 mg/dL (ref 8–23)
CO2: 24 mmol/L (ref 22–32)
Calcium: 7.7 mg/dL — ABNORMAL LOW (ref 8.9–10.3)
Chloride: 108 mmol/L (ref 98–111)
Creatinine, Ser: 0.76 mg/dL (ref 0.44–1.00)
GFR, Estimated: >60 mL/min (ref >60)
Glucose, Bld: 130 mg/dL — ABNORMAL HIGH (ref 70–99)
Potassium: 3.5 mmol/L (ref 3.5–5.1)
Sodium: 137 mmol/L (ref 135–145)
Total Bilirubin: 0.9 mg/dL (ref 0.0–1.2)
Total Protein: 5.3 g/dL — ABNORMAL LOW (ref 6.5–8.1)

## 2023-04-03 LAB — GLUCOSE, CAPILLARY
Glucose-Capillary: 101 mg/dL — ABNORMAL HIGH (ref 70–99)
Glucose-Capillary: 117 mg/dL — ABNORMAL HIGH (ref 70–99)
Glucose-Capillary: 131 mg/dL — ABNORMAL HIGH (ref 70–99)
Glucose-Capillary: 142 mg/dL — ABNORMAL HIGH (ref 70–99)
Glucose-Capillary: 145 mg/dL — ABNORMAL HIGH (ref 70–99)

## 2023-04-03 LAB — CBC WITH DIFFERENTIAL/PLATELET
Abs Immature Granulocytes: 0.01 10*3/uL (ref 0.00–0.07)
Basophils Absolute: 0 10*3/uL (ref 0.0–0.1)
Basophils Relative: 1 %
Eosinophils Absolute: 0.3 10*3/uL (ref 0.0–0.5)
Eosinophils Relative: 5 %
HCT: 24.9 % — ABNORMAL LOW (ref 36.0–46.0)
Hemoglobin: 8.2 g/dL — ABNORMAL LOW (ref 12.0–15.0)
Immature Granulocytes: 0 %
Lymphocytes Relative: 41 %
Lymphs Abs: 2.3 10*3/uL (ref 0.7–4.0)
MCH: 32.5 pg (ref 26.0–34.0)
MCHC: 32.9 g/dL (ref 30.0–36.0)
MCV: 98.8 fL (ref 80.0–100.0)
Monocytes Absolute: 0.7 10*3/uL (ref 0.1–1.0)
Monocytes Relative: 13 %
Neutro Abs: 2.2 10*3/uL (ref 1.7–7.7)
Neutrophils Relative %: 40 %
Platelets: 121 10*3/uL — ABNORMAL LOW (ref 150–400)
RBC: 2.52 MIL/uL — ABNORMAL LOW (ref 3.87–5.11)
RDW: 14.2 % (ref 11.5–15.5)
WBC: 5.5 10*3/uL (ref 4.0–10.5)
nRBC: 0 % (ref 0.0–0.2)

## 2023-04-03 LAB — PROTIME-INR
INR: 1.2 (ref 0.8–1.2)
Prothrombin Time: 15.5 s — ABNORMAL HIGH (ref 11.4–15.2)

## 2023-04-03 LAB — IRON AND TIBC
Iron: 37 ug/dL (ref 28–170)
Saturation Ratios: 13 % (ref 10.4–31.8)
TIBC: 294 ug/dL (ref 250–450)
UIBC: 257 ug/dL

## 2023-04-03 LAB — FERRITIN: Ferritin: 80 ng/mL (ref 11–307)

## 2023-04-03 LAB — OCCULT BLOOD, POC DEVICE: Fecal Occult Bld: POSITIVE — AB

## 2023-04-03 NOTE — Progress Notes (Signed)
 PROGRESS NOTE   Betty Gonzalez  YNW:[id number] DOB: 02-07-1951 DOA: 04/01/2023 PCP: Ofilia Neas, PA-C   Chief Complaint  Patient presents with   Hematemesis   Level of care: Stepdown  Brief Admission History:  73 year old female with controlled type 2 diabetes mellitus, hyperlipidemia with statin intolerance, allergic rhinitis, history of Lyme disease, Mnire's disease of left ear, adjustment disorder with anxiety started vomiting and feeling ill after having shrimp for supper.  She reportedly vomited multiple times at around 10 PM and started seeing blood in the emesis.  She is not having any abdominal pain.  She is having dizziness.  She denies chest pain or shortness of breath.  She is taking daily aspirin.  Patient was started on a pantoprazole protocol and given IV Zofran in the ED, hemoglobin noted to be 13 down to 11.  She also was noted to have an elevated BUN of 39.  GI was consulted and requested patient be kept n.p.o. and have a repeat CBC in the morning and likely endoscopy on 04/01/2023.   Assessment and Plan:  Acute esophageal variceal bleed  - pt was taken for EGD on 2/15 and bleeding varices was banded  - pt remains on 72 hours of IV octreotide in the ICU should complete 2/18 - follow CBC every 8 hours stable, now checking daily - IV pantoprazole ordered per GI recommendations - IV ceftriaxone 1 gram daily ordered but planning for full 7 day antibiotic course - Hg down to 8.2 this morning - advanced to soft diet per GI recommendation - CT abdomen pelvis with findings of liver cirrhosis, no portal vein thrombus - BUN down to normal at 24  - hepatitis serologies pending - GI ordered and will follow up - GI planning to DC home on carvedilol  Elevated BUN  - secondary to upper GI hemorrhage - trended back down to normal which is reassuring that bleeding has stopped  Type 2 DM controlled  - as evidenced by A1c 6.8%  - SSI coverage and CBG monitoring - continue  daily home empagliflozin 10 mg   CBG (last 3)  Recent Labs    04/02/23 1615 04/03/23 0009 04/03/23 0727  GLUCAP 98 101* 142*   DVT prophylaxis: scds Code Status: Full  Family Communication: husband bedside 2/15, 2/16, 2/17  Disposition: anticipate home with husband on 2/18 if ok with GI service    Consultants:  GI  Procedures:  EGD 2/15 with Dr. Tasia Catchings   Antimicrobials:    Subjective: Pt tolerating octreotide infusion well; no specific complaints, eager to get home.    Objective: Vitals:   04/03/23 0716 04/03/23 0717 04/03/23 0721 04/03/23 0935  BP: (!) 93/45   (!) 110/44  Pulse:      Resp: 19 14 16 19   Temp:   98.2 F (36.8 C)   TempSrc:   Oral   SpO2: 95% 94%  97%  Weight:      Height:        Intake/Output Summary (Last 24 hours) at 04/03/2023 1114 Last data filed at 04/02/2023 1738 Gross per 24 hour  Intake 464.83 ml  Output --  Net 464.83 ml   Filed Weights   04/01/23 0247 04/01/23 1514  Weight: 76 kg 75.3 kg   Examination:  General exam: Appears calm and comfortable  Respiratory system: Clear to auscultation. Respiratory effort normal. Cardiovascular system: normal S1 & S2 heard. No JVD, murmurs, rubs, gallops or clicks. No pedal edema. Gastrointestinal system: Abdomen is nondistended, soft and  nontender. No organomegaly or masses felt. Normal bowel sounds heard. Central nervous system: Alert and oriented. No focal neurological deficits. Extremities: Symmetric 5 x 5 power. Skin: No rashes, lesions or ulcers. Psychiatry: Judgement and insight appear normal. Mood & affect appropriate.   Data Reviewed: I have personally reviewed following labs and imaging studies  CBC: Recent Labs  Lab 04/01/23 0753 04/01/23 1659 04/02/23 0615 04/02/23 1651 04/03/23 0822  WBC 8.8 9.8 6.5 7.3 5.5  NEUTROABS  --   --   --   --  2.2  HGB 10.2* 9.7* 8.5* 8.8* 8.2*  HCT 30.3* 30.3* 26.2* 26.8* 24.9*  MCV 96.5 98.4 97.4 99.3 98.8  PLT 142* 138* 124* 143* 121*     Basic Metabolic Panel: Recent Labs  Lab 04/01/23 0249 04/02/23 0615 04/03/23 0511  NA 139 140 137  K 3.5 3.8 3.5  CL 105 109 108  CO2 24 25 24   GLUCOSE 209* 157* 130*  BUN 39* 33* 19  CREATININE 0.76 0.74 0.76  CALCIUM 8.4* 8.0* 7.7*  MG  --  1.9  --     CBG: Recent Labs  Lab 04/02/23 0738 04/02/23 1137 04/02/23 1615 04/03/23 0009 04/03/23 0727  GLUCAP 149* 145* 98 101* 142*    Recent Results (from the past 240 hours)  MRSA Next Gen by PCR, Nasal     Status: None   Collection Time: 04/01/23  3:00 PM   Specimen: Nasal Mucosa; Nasal Swab  Result Value Ref Range Status   MRSA by PCR Next Gen NOT DETECTED NOT DETECTED Final    Comment: (NOTE) The GeneXpert MRSA Assay (FDA approved for NASAL specimens only), is one component of a comprehensive MRSA colonization surveillance program. It is not intended to diagnose MRSA infection nor to guide or monitor treatment for MRSA infections. Test performance is not FDA approved in patients less than 46 years old. Performed at [facility], [address] [place], [place] [address]      Radiology Studies: CT ABDOMEN PELVIS W CONTRAST Result Date: 04/01/2023 CLINICAL DATA:  Portal hypertension and cirrhosis. Evaluate for portal vein thrombosis. Patient reports hematemesis. EXAM: CT ABDOMEN AND PELVIS WITH CONTRAST TECHNIQUE: Multidetector CT imaging of the abdomen and pelvis was performed using the standard protocol following bolus administration of intravenous contrast. RADIATION DOSE REDUCTION: This exam was performed according to the departmental dose-optimization program which includes automated exposure control, adjustment of the mA and/or kV according to patient size and/or use of iterative reconstruction technique. CONTRAST:  OMNIPAQUE IOHEXOL 300 MG/ML  SOLN COMPARISON:  Ultrasound 01/21/2021 FINDINGS: Lower chest: Wall thickening of the distal esophagus with areas of esophageal enhancement. No pleural effusion.  Hepatobiliary: Cirrhotic hepatic morphology with nodular contours. Decreased hepatic density. No discrete focal hepatic lesion. Mild diffuse gallbladder wall thickening without abnormal gallbladder distension. No calcified gallstone. No biliary dilatation. The portal, splenic, and mesenteric veins are patent. Pancreas: No ductal dilatation or inflammation. Spleen: Normal in size without focal abnormality. Adrenals/Urinary Tract: No adrenal nodule. Cortical scarring in the upper and lower right kidney. Nonobstructing upper right renal stone. Right upper pole calyceal diverticulum. There is homogeneous renal enhancement with symmetric excretion on delayed phase imaging. Partially distended urinary bladder, normal for degree of distension. Stomach/Bowel: Distal esophageal wall thickening with areas of enhancement. The stomach is nondistended. No small bowel obstruction. Equivocal areas of bowel wall thickening within the distal small bowel, series 2, image 72. Normal appendix. Colonic diverticulosis without focal diverticulitis. There is mild wall thickening at the splenic  flexure of the colon, series 2, image 22 in descending colon series 2 image 45. Vascular/Lymphatic: The portal, splenic, and mesenteric veins are patent. No portal vein thrombus. Aortic atherosclerosis without aneurysm. 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 Prominent 13 mm porta hepatis node series 2, image 23. Prominent 11 mm portal caval node series 2, image 27. Reproductive: Calcifications in the lower uterus may represent lower uterine fibroids. No adnexal mass. Other: No ascites.  Diminutive fat containing umbilical hernia. Musculoskeletal: Prominent Schmorl's node inferior endplate of L4. Lower lumbar facet hypertrophy. The bones are under mineralized. There are no acute or suspicious osseous abnormalities. IMPRESSION: 1. Cirrhosis. No portal vein thrombus. 2. Wall thickening of the distal esophagus with areas of  enhancement. This may represent esophageal varices. 3. Equivocal areas of bowel wall thickening within the distal small bowel, splenic flexure, and descending colon. Recommend correlation with any symptoms of enterocolitis. 4. Colonic diverticulosis without focal diverticulitis. 5. Nonobstructing right renal stone. Aortic Atherosclerosis (ICD10-I70.0). Electronically Signed   By: Narda Rutherford M.D.   On: 04/01/2023 16:55    Scheduled Meds:  Chlorhexidine Gluconate Cloth  6 each Topical Daily   empagliflozin  10 mg Oral Daily   insulin aspart  0-6 Units Subcutaneous TID WC   multivitamin with minerals  1 tablet Oral Daily   pantoprazole (PROTONIX) IV  40 mg Intravenous Q12H   cyanocobalamin  500 mcg Oral Daily   Continuous Infusions:  cefTRIAXone (ROCEPHIN)  IV Stopped (04/02/23 1700)   octreotide (SANDOSTATIN) 500 mcg in sodium chloride 0.9 % 250 mL (2 mcg/mL) infusion 50 mcg/hr (04/03/23 1028)    LOS: 2 days   Critical Care Procedure Note Authorized and Performed by: Maryln Manuel MD  Total Critical Care time:  53 mins Due to a high probability of clinically significant, life threatening deterioration, the patient required my highest level of preparedness to intervene emergently and I personally spent this critical care time directly and personally managing the patient.  This critical care time included obtaining a history; examining the patient, pulse oximetry; ordering and review of studies; arranging urgent treatment with development of a management plan; evaluation of patient's response of treatment; frequent reassessment; and discussions with other providers.  This critical care time was performed to assess and manage the high probability of imminent and life threatening deterioration that could result in multi-organ failure.  It was exclusive of separately billable procedures and treating other patients and teaching time.   Standley Dakins, MD How to contact the [facility] Attending or  Consulting provider 7A - 7P or covering provider during after hours 7P -7A, for this patient?  Check the care team in [facility] and look for a) attending/consulting [facility] provider listed and b) the [facility] team listed Log into [email or web address] to find provider on call.  Locate the [facility] provider you are looking for under [facility] and page to a number that you can be directly reached. If you still have difficulty reaching the provider, please page the [facility] (Director on Call) for the Hospitalists listed on amion for assistance.  04/03/2023, 11:14 AM

## 2023-04-03 NOTE — Progress Notes (Addendum)
 Gastroenterology Progress Note    Primary Gastroenterologist:  Dr. Tasia Catchings   Patient ID: Baird Cancer; [id number]; Jan 10, 1951    Subjective   No overt GI bleeding. Denies mental status changes or confusion. No abdominal pain. On clear liquids. Wants to advance diet.    Objective   Vital signs in last 24 hours Temp:  [97.9 F (36.6 C)-98.2 F (36.8 C)] 98.2 F (36.8 C) (02/17 0721) Pulse Rate:  [79-95] (P) 81 (02/17 0600) Resp:  [14-23] 16 (02/17 0721) BP: (93-141)/(39-97) 93/45 (02/17 0716) SpO2:  [92 %-97 %] 94 % (02/17 0717) Last BM Date : 04/02/23  Physical Exam General:   Alert and oriented, pleasant Eyes:  No icterus, sclera clear. Conjuctiva pink.  Abdomen:  Bowel sounds present, soft, non-tender, non-distended. No HSM or hernias noted. No rebound or guarding. No masses appreciated  Extremities:  Without edema. Neurologic:  Alert and  oriented x4    Intake/Output from previous day: 02/16 0701 - 02/17 0700 In: 464.8 [I.V.:364.8; IV Piggyback:100] Out: -  Intake/Output this shift: No intake/output data recorded.  Lab Results  Recent Labs    04/02/23 0615 04/02/23 1651 04/03/23 0822  WBC 6.5 7.3 5.5  HGB 8.5* 8.8* 8.2*  HCT 26.2* 26.8* 24.9*  PLT 124* 143* 121*   BMET Recent Labs    04/01/23 0249 04/02/23 0615 04/03/23 0511  NA 139 140 137  K 3.5 3.8 3.5  CL 105 109 108  CO2 24 25 24   GLUCOSE 209* 157* 130*  BUN 39* 33* 19  CREATININE 0.76 0.74 0.76  CALCIUM 8.4* 8.0* 7.7*   LFT Recent Labs    04/01/23 0249 04/02/23 0615 04/03/23 0511  PROT 6.7 5.6* 5.3*  ALBUMIN 3.2* 3.0* 2.8*  AST 28 30 36  ALT 24 23 25   ALKPHOS 105 84 77  BILITOT 1.7* 0.9 0.9   PT/INR Recent Labs    04/02/23 0615 04/03/23 0839  LABPROT 16.6* 15.5*  INR 1.3* 1.2   Hepatitis Panel Recent Labs    04/02/23 0615  HEPBSAG NON REACTIVE  HCVAB NON REACTIVE  HEPAIGM NON REACTIVE  HEPBIGM NON REACTIVE     Studies/Results CT ABDOMEN PELVIS W  CONTRAST Result Date: 04/01/2023 CLINICAL DATA:  Portal hypertension and cirrhosis. Evaluate for portal vein thrombosis. Patient reports hematemesis. EXAM: CT ABDOMEN AND PELVIS WITH CONTRAST TECHNIQUE: Multidetector CT imaging of the abdomen and pelvis was performed using the standard protocol following bolus administration of intravenous contrast. RADIATION DOSE REDUCTION: This exam was performed according to the departmental dose-optimization program which includes automated exposure control, adjustment of the mA and/or kV according to patient size and/or use of iterative reconstruction technique. CONTRAST:  OMNIPAQUE IOHEXOL 300 MG/ML  SOLN COMPARISON:  Ultrasound 01/21/2021 FINDINGS: Lower chest: Wall thickening of the distal esophagus with areas of esophageal enhancement. No pleural effusion. Hepatobiliary: Cirrhotic hepatic morphology with nodular contours. Decreased hepatic density. No discrete focal hepatic lesion. Mild diffuse gallbladder wall thickening without abnormal gallbladder distension. No calcified gallstone. No biliary dilatation. The portal, splenic, and mesenteric veins are patent. Pancreas: No ductal dilatation or inflammation. Spleen: Normal in size without focal abnormality. Adrenals/Urinary Tract: No adrenal nodule. Cortical scarring in the upper and lower right kidney. Nonobstructing upper right renal stone. Right upper pole calyceal diverticulum. There is homogeneous renal enhancement with symmetric excretion on delayed phase imaging. Partially distended urinary bladder, normal for degree of distension. Stomach/Bowel: Distal esophageal wall thickening with areas of enhancement. The stomach is nondistended. No small bowel obstruction. Equivocal  areas of bowel wall thickening within the distal small bowel, series 2, image 72. Normal appendix. Colonic diverticulosis without focal diverticulitis. There is mild wall thickening at the splenic flexure of the colon, series 2, image 22 in  descending colon series 2 image 45. Vascular/Lymphatic: The portal, splenic, and mesenteric veins are patent. No portal vein thrombus. Aortic atherosclerosis without aneurysm. 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 Prominent 13 mm porta hepatis node series 2, image 23. Prominent 11 mm portal caval node series 2, image 27. Reproductive: Calcifications in the lower uterus may represent lower uterine fibroids. No adnexal mass. Other: No ascites.  Diminutive fat containing umbilical hernia. Musculoskeletal: Prominent Schmorl's node inferior endplate of L4. Lower lumbar facet hypertrophy. The bones are under mineralized. There are no acute or suspicious osseous abnormalities. IMPRESSION: 1. Cirrhosis. No portal vein thrombus. 2. Wall thickening of the distal esophagus with areas of enhancement. This may represent esophageal varices. 3. Equivocal areas of bowel wall thickening within the distal small bowel, splenic flexure, and descending colon. Recommend correlation with any symptoms of enterocolitis. 4. Colonic diverticulosis without focal diverticulitis. 5. Nonobstructing right renal stone. Aortic Atherosclerosis (ICD10-I70.0). Electronically Signed   By: Narda Rutherford M.D.   On: 04/01/2023 16:55    Assessment  73 y.o. female with a history of DM, hyperlipidemia, Lyme disease, hematemesis due to upper GI bleed and new diagnosis of cirrhosis.   Cirrhosis: decompensated with variceal bleed. MELD 3.0 today with today's labs is 10. Acute hepatitis panel negative. Unclear etiology. platelets normal up until 2024, no chronically elevated LFTs, [place] normal in Dec 2022. Will obtain thorough serologies. She has had no further overt GI bleeding and Hgb remaining stable. No signs/symptoms of encephalopathy, ascites.   Bowel wall thickening: suspected due to underdistension or edema. Can pursue colonoscopy as outpatient.     Plan / Recommendations  Advance to soft diet Further extensive  serologies, add AFP MELD labs daily Octreotide through 2/18 and can then discontinue (Total 72 hours) Empiric antibiotics for total of 7 days. If improved enough for discharge in the future, can switch to oral to complete course PPI IV BID for total of 3 days, transition to oral tomorrow and d/c on BID PPI Carvedilol at discharge for secondary variceal bleeding prophylaxis Needs outpatient [place], further cirrhosis care Repeat EGD in 4-6 weeks for surveillance of varices. Can consider colonoscopy at that time.     LOS: 2 days    04/03/2023, 9:34 AM  Gelene Mink, PhD, [facility] Gastroenterology

## 2023-04-03 NOTE — Plan of Care (Signed)

## 2023-04-04 ENCOUNTER — Encounter (INDEPENDENT_AMBULATORY_CARE_PROVIDER_SITE_OTHER): Payer: Self-pay

## 2023-04-04 ENCOUNTER — Other Ambulatory Visit (INDEPENDENT_AMBULATORY_CARE_PROVIDER_SITE_OTHER): Payer: Self-pay

## 2023-04-04 ENCOUNTER — Telehealth: Payer: Self-pay | Admitting: Gastroenterology

## 2023-04-04 DIAGNOSIS — K7460 Unspecified cirrhosis of liver: Secondary | ICD-10-CM | POA: Diagnosis not present

## 2023-04-04 DIAGNOSIS — K7469 Other cirrhosis of liver: Secondary | ICD-10-CM | POA: Diagnosis not present

## 2023-04-04 DIAGNOSIS — I8501 Esophageal varices with bleeding: Secondary | ICD-10-CM

## 2023-04-04 DIAGNOSIS — D62 Acute posthemorrhagic anemia: Secondary | ICD-10-CM | POA: Diagnosis not present

## 2023-04-04 DIAGNOSIS — K7290 Hepatic failure, unspecified without coma: Secondary | ICD-10-CM

## 2023-04-04 DIAGNOSIS — K922 Gastrointestinal hemorrhage, unspecified: Secondary | ICD-10-CM

## 2023-04-04 DIAGNOSIS — K5900 Constipation, unspecified: Secondary | ICD-10-CM | POA: Diagnosis not present

## 2023-04-04 DIAGNOSIS — I8511 Secondary esophageal varices with bleeding: Secondary | ICD-10-CM | POA: Diagnosis not present

## 2023-04-04 DIAGNOSIS — K920 Hematemesis: Secondary | ICD-10-CM

## 2023-04-04 LAB — CBC
HCT: 25 % — ABNORMAL LOW (ref 36.0–46.0)
Hemoglobin: 8 g/dL — ABNORMAL LOW (ref 12.0–15.0)
MCH: 31.4 pg (ref 26.0–34.0)
MCHC: 32 g/dL (ref 30.0–36.0)
MCV: 98 fL (ref 80.0–100.0)
Platelets: 133 10*3/uL — ABNORMAL LOW (ref 150–400)
RBC: 2.55 MIL/uL — ABNORMAL LOW (ref 3.87–5.11)
RDW: 13.8 % (ref 11.5–15.5)
WBC: 5.5 10*3/uL (ref 4.0–10.5)
nRBC: 0 % (ref 0.0–0.2)

## 2023-04-04 LAB — GLUCOSE, CAPILLARY
Glucose-Capillary: 124 mg/dL — ABNORMAL HIGH (ref 70–99)
Glucose-Capillary: 126 mg/dL — ABNORMAL HIGH (ref 70–99)
Glucose-Capillary: 138 mg/dL — ABNORMAL HIGH (ref 70–99)
Glucose-Capillary: 155 mg/dL — ABNORMAL HIGH (ref 70–99)

## 2023-04-04 LAB — HIV ANTIBODY (ROUTINE TESTING W REFLEX): HIV Screen 4th Generation wRfx: NONREACTIVE

## 2023-04-04 LAB — IGG, IGA, IGM
IgA: 394 mg/dL (ref 64–422)
IgG (Immunoglobin G), Serum: 1070 mg/dL (ref 586–1602)
IgM (Immunoglobulin M), Srm: 336 mg/dL — ABNORMAL HIGH (ref 26–217)

## 2023-04-04 LAB — CERULOPLASMIN: Ceruloplasmin: 21.8 mg/dL (ref 19.0–39.0)

## 2023-04-04 LAB — MITOCHONDRIAL ANTIBODIES: Mitochondrial M2 Ab, IgG: <20 U (ref 0.0–20.0)

## 2023-04-04 LAB — ANTI-SMOOTH MUSCLE ANTIBODY, IGG: F-Actin IgG: 4 U (ref 0–19)

## 2023-04-04 LAB — ANA W/REFLEX IF POSITIVE: Anti Nuclear Antibody (ANA): NEGATIVE

## 2023-04-04 MED ORDER — PANTOPRAZOLE SODIUM 40 MG PO TBEC
40.0000 mg | DELAYED_RELEASE_TABLET | Freq: Two times a day (BID) | ORAL | Status: DC
  Administered 2023-04-04: 40 mg via ORAL
  Filled 2023-04-04: qty 1

## 2023-04-04 MED ORDER — PANTOPRAZOLE SODIUM 40 MG PO TBEC: 40.0000 mg | DELAYED_RELEASE_TABLET | Freq: Every day | ORAL | 2 refills | Status: DC

## 2023-04-04 MED ORDER — CEFDINIR 300 MG PO CAPS
300.0000 mg | ORAL_CAPSULE | Freq: Every day | ORAL | 0 refills | Status: AC
Start: 2023-04-04 — End: 2023-04-08

## 2023-04-04 MED ORDER — POLYETHYLENE GLYCOL 3350 17 G PO PACK: 17.0000 g | PACK | Freq: Every day | ORAL | 2 refills | Status: DC

## 2023-04-04 MED ORDER — CARVEDILOL 3.125 MG PO TABS: ORAL_TABLET | ORAL | 0 refills | Status: DC

## 2023-04-04 MED ORDER — POLYETHYLENE GLYCOL 3350 17 G PO PACK
17.0000 g | PACK | Freq: Every day | ORAL | Status: DC
  Administered 2023-04-04: 17 g via ORAL
  Filled 2023-04-04: qty 1

## 2023-04-04 NOTE — Telephone Encounter (Signed)
 Mitzie:  Please arrange follow-up appt with Dr. Tasia Catchings in about 2 weeks.    Wendy/Crystal: can we have patient repeat a CBC on Thursday or Friday this week? Recently inpatient with variceal bleed. Thanks!

## 2023-04-04 NOTE — Discharge Instructions (Signed)
 IMPORTANT INFORMATION: PAY CLOSE ATTENTION   PHYSICIAN DISCHARGE INSTRUCTIONS  Follow with Primary care provider  Ofilia Neas, PA-C  and other consultants as instructed by your Hospitalist Physician  SEEK MEDICAL CARE OR RETURN TO EMERGENCY ROOM IF SYMPTOMS COME BACK, WORSEN OR NEW PROBLEM DEVELOPS   Please note: You were cared for by a hospitalist during your hospital stay. Every effort will be made to forward records to your primary care provider.  You can request that your primary care provider send for your hospital records if they have not received them.  Once you are discharged, your primary care physician will handle any further medical issues. Please note that NO REFILLS for any discharge medications will be authorized once you are discharged, as it is imperative that you return to your primary care physician (or establish a relationship with a primary care physician if you do not have one) for your post hospital discharge needs so that they can reassess your need for medications and monitor your lab values.  Please get a complete blood count and chemistry panel checked by your Primary MD at your next visit, and again as instructed by your Primary MD.  Get Medicines reviewed and adjusted: Please take all your medications with you for your next visit with your Primary MD  Laboratory/radiological data: Please request your Primary MD to go over all hospital tests and procedure/radiological results at the follow up, please ask your primary care provider to get all Hospital records sent to his/her office.  In some cases, they will be blood work, cultures and biopsy results pending at the time of your discharge. Please request that your primary care provider follow up on these results.  If you are diabetic, please bring your blood sugar readings with you to your follow up appointment with primary care.    Please call and make your follow up appointments as soon as possible.    Also  Note the following: If you experience worsening of your admission symptoms, develop shortness of breath, life threatening emergency, suicidal or homicidal thoughts you must seek medical attention immediately by calling 911 or calling your MD immediately  if symptoms less severe.  You must read complete instructions/literature along with all the possible adverse reactions/side effects for all the Medicines you take and that have been prescribed to you. Take any new Medicines after you have completely understood and accpet all the possible adverse reactions/side effects.   Do not drive when taking Pain medications or sleeping medications (Benzodiazepines)  Do not take more than prescribed Pain, Sleep and Anxiety Medications. It is not advisable to combine anxiety,sleep and pain medications without talking with your primary care practitioner  Special Instructions: If you have smoked or chewed Tobacco  in the last 2 yrs please stop smoking, stop any regular Alcohol  and or any Recreational drug use.  Wear Seat belts while driving.  Do not drive if taking any narcotic, mind altering or controlled substances or recreational drugs or alcohol.

## 2023-04-04 NOTE — Care Management Important Message (Signed)
 Important Message  Patient Details  Name: Betty Gonzalez MRN: [id number] Date of Birth: 1951/01/16   Important Message Given:  Yes - Medicare IM     Corey Harold 04/04/2023, 3:34 PM

## 2023-04-04 NOTE — Discharge Summary (Addendum)
 Physician Discharge Summary  Betty Gonzalez WJX:[id number] DOB: [birth year] DOA: 04/01/2023  PCP: Ofilia Neas, PA-C  Admit date: 04/01/2023 Discharge date: 04/04/2023  Admitted From:  HOME  Disposition: HOME   Recommendations for Outpatient Follow-up:  Follow up with [facility] in 2 weeks  Follow up with PCP in 1-2 weeks  Please obtain BMP/CBC in 1 weeks  Discharge Condition: STABLE   CODE STATUS: FULL DIET: low sodium soft foods recommended    Brief Hospitalization Summary: Please see all hospital notes, images, labs for full details of the hospitalization. 73 year old female with controlled type 2 diabetes mellitus, hyperlipidemia with statin intolerance, allergic rhinitis, history of Lyme disease, Mnire's disease of left ear, adjustment disorder with anxiety started vomiting and feeling ill after having shrimp for supper.  She reportedly vomited multiple times at around 10 PM and started seeing blood in the emesis.  She is not having any abdominal pain.  She is having dizziness.  She denies chest pain or shortness of breath.  She is taking daily aspirin.  Patient was started on a pantoprazole protocol and given IV Zofran in the ED, hemoglobin noted to be 13 down to 11.  She also was noted to have an elevated BUN of 39.  GI was consulted and requested patient be kept n.p.o. and have a repeat CBC in the morning and likely endoscopy on 04/01/2023.  Hospital Course by problem list   Acute esophageal variceal bleed  - pt was taken for EGD on 2/15 and bleeding varices was banded  - pt completed 72 hours of IV octreotide in the ICU and no further bleeding and hg stable  - followed CBC every 8 hours stable, Hg remained stable at 8.  - IV pantoprazole ordered per GI recommendations and discharged on daily pantoprazole 40 mg - IV ceftriaxone 1 gram daily ordered but planning for full 7 day antibiotic course, cefdinir x 4 more days at discharge  - Hg 8.0 this morning - advanced to soft  diet per GI recommendation - CT abdomen pelvis with findings of liver cirrhosis, no portal vein thrombus - BUN down to normal at 24  - hepatitis serologies pending - GI ordered and will follow up outpatient  - GI recommending to DC home on carvedilol 3.125 mg daily for 1 week, monitor BP at home and if tolerates, increase to 1 tablet twice daily.     Elevated BUN - RESOLVED  - secondary to upper GI hemorrhage - trended back down to normal which is reassuring that bleeding has stopped   Type 2 DM controlled  - as evidenced by A1c 6.8%  - SSI coverage and CBG monitoring - continue daily home empagliflozin 10 mg   CBG (last 3)  Recent Labs    04/04/23 0422 04/04/23 0753 04/04/23 1149  GLUCAP 124* 126* 155*    Discharge Diagnoses:  Principal Problem:   Bleeding esophageal varices (HCC) Active Problems:   Upper GI bleed   Vomiting blood   Acute blood loss anemia   Thrombocytopenia (HCC)   Statin intolerance   Allergic rhinitis   Hyperlipemia   Meniere's disease of left ear   Type 2 diabetes mellitus (HCC)   Congenital urethral stenosis   Acute upper GI bleed   Other cirrhosis of liver (HCC)   Discharge Instructions:  Allergies as of 04/04/2023       Reactions   Statins Rash   Codeine    Sulfonamide Derivatives    Other Rash, Other (See Comments)  Thermasol - active ingredient in the flu shot -- made eyes swell up Statins - affects muscles in legs Adhesive Tape;  Blisters   Adhesive Tape;  Blisters  Adhesive Tape;  Blisters   Pedi-pre Tape Spray [wound Dressing Adhesive] Other (See Comments)   Adhesive Tape;  Blisters    Penicillins Rash   Silicone Rash   Blisters  Adhesive Tape;  Blisters   Adhesive Tape;  Blisters   Blisters  Adhesive Tape;  Blisters   Blisters  Adhesive Tape;  Blisters  Blisters, Adhesive Tape;  Blisters  Blisters Adhesive Tape;  Blisters     Blisters Adhesive Tape;  Blisters  Adhesive Tape;  Blisters  Blisters Adhesive Tape;   Blisters  Blisters Adhesive Tape;  Blisters     Blisters, Adhesive Tape;  Blisters   Tape Rash, Other (See Comments)   Adhesive Tape;  Blisters  Blisters Adhesive Tape;  Blisters         Medication List     STOP taking these medications    ciprofloxacin 500 MG tablet Commonly known as: CIPRO       TAKE these medications    carvedilol 3.125 MG tablet Commonly known as: Coreg Take 1 tablet daily for 1 week and monitor BP at home, if tolerated increase to 1 tablet twice daily   cefdinir 300 MG capsule Commonly known as: OMNICEF Take 1 capsule (300 mg total) by mouth daily for 4 days.   cyanocobalamin 500 MCG tablet Commonly known as: VITAMIN B12 Take 500 mcg by mouth daily.   cyclobenzaprine 5 MG tablet Commonly known as: FLEXERIL Take 1 tablet (5 mg total) by mouth 3 (three) times daily as needed for muscle spasms. Do not drink alcohol or drive while taking this medication, may cause drowsiness   escitalopram 5 MG tablet Commonly known as: LEXAPRO Take 5 mg by mouth daily.   fluticasone 50 MCG/ACT nasal spray Commonly known as: FLONASE Place 2 sprays into both nostrils daily.   Jardiance 10 MG Tabs tablet Generic drug: empagliflozin Take 10 mg by mouth daily.   LORazepam 1 MG tablet Commonly known as: ATIVAN Take 1 tablet (1 mg total) by mouth daily as needed for anxiety. No refills until 09/29/2017   multivitamin with minerals Tabs tablet Take 1 tablet by mouth daily.   pantoprazole 40 MG tablet Commonly known as: PROTONIX Take 1 tablet (40 mg total) by mouth daily.   polyethylene glycol 17 g packet Commonly known as: MIRALAX / GLYCOLAX Take 17 g by mouth daily. Start taking on: April 05, 2023   ZINC-220 PO Take 1 tablet by mouth daily.        Follow-up Information     [facility] at [facility]. Schedule an appointment as soon as possible for a visit in 2 week(s).   Specialty: Gastroenterology Why: Hospital  Follow Up Contact information: [address] [place] [address] [phone]        Ofilia Neas, PA-C Follow up.   Specialty: Urgent Care Contact information: [address] [place] [address] [phone]                Allergies  Allergen Reactions   Statins Rash   Codeine    Sulfonamide Derivatives    Other Rash and Other (See Comments)    Thermasol - active ingredient in the flu shot -- made eyes swell up  Statins - affects muscles in legs  Adhesive Tape;  Blisters  Adhesive Tape;  Blisters   Adhesive Tape;  Blisters   Pedi-Pre Tape Spray [Wound Dressing Adhesive] Other (See Comments)    Adhesive Tape;  Blisters    Penicillins Rash   Silicone Rash    Blisters  Adhesive Tape;  Blisters   Adhesive Tape;  Blisters   Blisters  Adhesive Tape;  Blisters   Blisters  Adhesive Tape;  Blisters   Blisters, Adhesive Tape;  Blisters   Blisters Adhesive Tape;  Blisters     Blisters Adhesive Tape;  Blisters  Adhesive Tape;  Blisters  Blisters Adhesive Tape;  Blisters  Blisters Adhesive Tape;  Blisters     Blisters, Adhesive Tape;  Blisters   Tape Rash and Other (See Comments)    Adhesive Tape;  Blisters  Blisters Adhesive Tape;  Blisters    Allergies as of 04/04/2023       Reactions   Statins Rash   Codeine    Sulfonamide Derivatives    Other Rash, Other (See Comments)   Thermasol - active ingredient in the flu shot -- made eyes swell up Statins - affects muscles in legs Adhesive Tape;  Blisters   Adhesive Tape;  Blisters  Adhesive Tape;  Blisters   Pedi-pre Tape Spray [wound Dressing Adhesive] Other (See Comments)   Adhesive Tape;  Blisters    Penicillins Rash   Silicone Rash   Blisters  Adhesive Tape;  Blisters   Adhesive Tape;  Blisters   Blisters  Adhesive Tape;  Blisters   Blisters  Adhesive Tape;  Blisters  Blisters, Adhesive Tape;  Blisters  Blisters Adhesive Tape;  Blisters     Blisters  Adhesive Tape;  Blisters  Adhesive Tape;  Blisters  Blisters Adhesive Tape;  Blisters  Blisters Adhesive Tape;  Blisters     Blisters, Adhesive Tape;  Blisters   Tape Rash, Other (See Comments)   Adhesive Tape;  Blisters  Blisters Adhesive Tape;  Blisters         Medication List     STOP taking these medications    ciprofloxacin 500 MG tablet Commonly known as: CIPRO       TAKE these medications    carvedilol 3.125 MG tablet Commonly known as: Coreg Take 1 tablet daily for 1 week and monitor BP at home, if tolerated increase to 1 tablet twice daily   cefdinir 300 MG capsule Commonly known as: OMNICEF Take 1 capsule (300 mg total) by mouth daily for 4 days.   cyanocobalamin 500 MCG tablet Commonly known as: VITAMIN B12 Take 500 mcg by mouth daily.   cyclobenzaprine 5 MG tablet Commonly known as: FLEXERIL Take 1 tablet (5 mg total) by mouth 3 (three) times daily as needed for muscle spasms. Do not drink alcohol or drive while taking this medication, may cause drowsiness   escitalopram 5 MG tablet Commonly known as: LEXAPRO Take 5 mg by mouth daily.   fluticasone 50 MCG/ACT nasal spray Commonly known as: FLONASE Place 2 sprays into both nostrils daily.   Jardiance 10 MG Tabs tablet Generic drug: empagliflozin Take 10 mg by mouth daily.   LORazepam 1 MG tablet Commonly known as: ATIVAN Take 1 tablet (1 mg total) by mouth daily as needed for anxiety. No refills until 09/29/2017   multivitamin with minerals Tabs tablet Take 1 tablet by mouth daily.   pantoprazole 40 MG tablet Commonly known as: PROTONIX Take 1 tablet (40 mg total) by mouth daily.   polyethylene glycol 17 g packet Commonly known as: MIRALAX /  GLYCOLAX Take 17 g by mouth daily. Start taking on: April 05, 2023   ZINC-220 PO Take 1 tablet by mouth daily.       Procedures/Studies: CT ABDOMEN PELVIS W CONTRAST Result Date: 04/01/2023 CLINICAL DATA:  Portal hypertension and cirrhosis.  Evaluate for portal vein thrombosis. Patient reports hematemesis. EXAM: CT ABDOMEN AND PELVIS WITH CONTRAST TECHNIQUE: Multidetector CT imaging of the abdomen and pelvis was performed using the standard protocol following bolus administration of intravenous contrast. RADIATION DOSE REDUCTION: This exam was performed according to the departmental dose-optimization program which includes automated exposure control, adjustment of the mA and/or kV according to patient size and/or use of iterative reconstruction technique. CONTRAST:  OMNIPAQUE IOHEXOL 300 MG/ML  SOLN COMPARISON:  Ultrasound 01/21/2021 FINDINGS: Lower chest: Wall thickening of the distal esophagus with areas of esophageal enhancement. No pleural effusion. Hepatobiliary: Cirrhotic hepatic morphology with nodular contours. Decreased hepatic density. No discrete focal hepatic lesion. Mild diffuse gallbladder wall thickening without abnormal gallbladder distension. No calcified gallstone. No biliary dilatation. The portal, splenic, and mesenteric veins are patent. Pancreas: No ductal dilatation or inflammation. Spleen: Normal in size without focal abnormality. Adrenals/Urinary Tract: No adrenal nodule. Cortical scarring in the upper and lower right kidney. Nonobstructing upper right renal stone. Right upper pole calyceal diverticulum. There is homogeneous renal enhancement with symmetric excretion on delayed phase imaging. Partially distended urinary bladder, normal for degree of distension. Stomach/Bowel: Distal esophageal wall thickening with areas of enhancement. The stomach is nondistended. No small bowel obstruction. Equivocal areas of bowel wall thickening within the distal small bowel, series 2, image 72. Normal appendix. Colonic diverticulosis without focal diverticulitis. There is mild wall thickening at the splenic flexure of the colon, series 2, image 22 in descending colon series 2 image 45. Vascular/Lymphatic: The portal, splenic, and  mesenteric veins are patent. No portal vein thrombus. Aortic atherosclerosis without aneurysm. 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 Prominent 13 mm porta hepatis node series 2, image 23. Prominent 11 mm portal caval node series 2, image 27. Reproductive: Calcifications in the lower uterus may represent lower uterine fibroids. No adnexal mass. Other: No ascites.  Diminutive fat containing umbilical hernia. Musculoskeletal: Prominent Schmorl's node inferior endplate of L4. Lower lumbar facet hypertrophy. The bones are under mineralized. There are no acute or suspicious osseous abnormalities. IMPRESSION: 1. Cirrhosis. No portal vein thrombus. 2. Wall thickening of the distal esophagus with areas of enhancement. This may represent esophageal varices. 3. Equivocal areas of bowel wall thickening within the distal small bowel, splenic flexure, and descending colon. Recommend correlation with any symptoms of enterocolitis. 4. Colonic diverticulosis without focal diverticulitis. 5. Nonobstructing right renal stone. Aortic Atherosclerosis (ICD10-I70.0). Electronically Signed   By: Narda Rutherford M.D.   On: 04/01/2023 16:55     Subjective: Pt reports feeling well and eager to go home. Says she will follow up with [facility].   Discharge Exam: Vitals:   04/04/23 1200 04/04/23 1215  BP:    Pulse: 86   Resp: 18   Temp:  97.8 F (36.6 C)  SpO2: 99%    Vitals:   04/04/23 1000 04/04/23 1100 04/04/23 1200 04/04/23 1215  BP: (!) 119/58 (!) 132/47    Pulse:   86   Resp: 18 15 18    Temp:    97.8 F (36.6 C)  TempSrc:    Oral  SpO2: 96% 97% 99%   Weight:      Height:  General: Pt is alert, awake, not in acute distress Cardiovascular: normal S1/S2 +, no rubs, no gallops Respiratory: CTA bilaterally, no wheezing, no rhonchi Abdominal: Soft, NT, ND, bowel sounds + Extremities: no edema, no cyanosis   The results of significant diagnostics from this hospitalization  (including imaging, microbiology, ancillary and laboratory) are listed below for reference.     Microbiology: Recent Results (from the past 240 hours)  MRSA Next Gen by PCR, Nasal     Status: None   Collection Time: 04/01/23  3:00 PM   Specimen: Nasal Mucosa; Nasal Swab  Result Value Ref Range Status   MRSA by PCR Next Gen NOT DETECTED NOT DETECTED Final    Comment: (NOTE) The GeneXpert MRSA Assay (FDA approved for NASAL specimens only), is one component of a comprehensive MRSA colonization surveillance program. It is not intended to diagnose MRSA infection nor to guide or monitor treatment for MRSA infections. Test performance is not FDA approved in patients less than 44 years old. Performed at [facility], [address] [place], [place] [address]      Labs: BNP (last 3 results) No results for input(s): "BNP" in the last 8760 hours. Basic Metabolic Panel: Recent Labs  Lab 04/01/23 0249 04/02/23 0615 04/03/23 0511  NA 139 140 137  K 3.5 3.8 3.5  CL 105 109 108  CO2 24 25 24   GLUCOSE 209* 157* 130*  BUN 39* 33* 19  CREATININE 0.76 0.74 0.76  CALCIUM 8.4* 8.0* 7.7*  MG  --  1.9  --    Liver Function Tests: Recent Labs  Lab 04/01/23 0249 04/02/23 0615 04/03/23 0511  AST 28 30 36  ALT 24 23 25   ALKPHOS 105 84 77  BILITOT 1.7* 0.9 0.9  PROT 6.7 5.6* 5.3*  ALBUMIN 3.2* 3.0* 2.8*   Recent Labs  Lab 04/01/23 0249  LIPASE 34   No results for input(s): "AMMONIA" in the last 168 hours. CBC: Recent Labs  Lab 04/01/23 1659 04/02/23 0615 04/02/23 1651 04/03/23 0822 04/04/23 0340  WBC 9.8 6.5 7.3 5.5 5.5  NEUTROABS  --   --   --  2.2  --   HGB 9.7* 8.5* 8.8* 8.2* 8.0*  HCT 30.3* 26.2* 26.8* 24.9* 25.0*  MCV 98.4 97.4 99.3 98.8 98.0  PLT 138* 124* 143* 121* 133*   Cardiac Enzymes: No results for input(s): "CKTOTAL", "CKMB", "CKMBINDEX", "TROPONINI" in the last 168 hours. BNP: Invalid input(s): "POCBNP" CBG: Recent Labs  Lab 04/03/23 1957  04/04/23 0009 04/04/23 0422 04/04/23 0753 04/04/23 1149  GLUCAP 131* 138* 124* 126* 155*   D-Dimer No results for input(s): "DDIMER" in the last 72 hours. Hgb A1c No results for input(s): "HGBA1C" in the last 72 hours. Lipid Profile No results for input(s): "CHOL", "HDL", "LDLCALC", "TRIG", "CHOLHDL", "LDLDIRECT" in the last 72 hours. Thyroid function studies No results for input(s): "TSH", "T4TOTAL", "T3FREE", "THYROIDAB" in the last 72 hours.  Invalid input(s): "FREET3" Anemia work up Recent Labs    04/03/23 1524  FERRITIN 80  TIBC 294  IRON 37   Urinalysis    Component Value Date/Time   BILIRUBINUR negative 07/21/2020 1053   BILIRUBINUR Negative 10/22/2014 1717   KETONESUR negative 07/21/2020 1053   PROTEINUR negative 07/21/2020 1053   PROTEINUR Negative 10/22/2014 1717   UROBILINOGEN 0.2 07/21/2020 1053   NITRITE Negative 07/21/2020 1053   NITRITE Negative 10/22/2014 1717   LEUKOCYTESUR Negative 07/21/2020 1053   Sepsis Labs Recent Labs  Lab 04/02/23 0615 04/02/23 1651 04/03/23 0822 04/04/23  0340  WBC 6.5 7.3 5.5 5.5   Microbiology Recent Results (from the past 240 hours)  MRSA Next Gen by PCR, Nasal     Status: None   Collection Time: 04/01/23  3:00 PM   Specimen: Nasal Mucosa; Nasal Swab  Result Value Ref Range Status   MRSA by PCR Next Gen NOT DETECTED NOT DETECTED Final    Comment: (NOTE) The GeneXpert MRSA Assay (FDA approved for NASAL specimens only), is one component of a comprehensive MRSA colonization surveillance program. It is not intended to diagnose MRSA infection nor to guide or monitor treatment for MRSA infections. Test performance is not FDA approved in patients less than 84 years old. Performed at [facility], [address], [place], [place] [address]    Time coordinating discharge: 36 mins   SIGNED:  Standley Dakins, MD  [facility] 04/04/2023, 3:20 PM How to contact the [facility] Attending or Consulting provider 7A - 7P  or covering provider during after hours 7P -7A, for this patient?  Check the care team in [facility] and look for a) attending/consulting [facility] provider listed and b) the [facility] team listed Log into [email or web address] and use [place]'s universal password to access. If you do not have the password, please contact the hospital operator. Locate the [facility] provider you are looking for under [facility] and page to a number that you can be directly reached. If you still have difficulty reaching the provider, please page the [facility] (Director on Call) for the Hospitalists listed on amion for assistance.

## 2023-04-04 NOTE — Plan of Care (Signed)

## 2023-04-04 NOTE — Progress Notes (Signed)
 Gastroenterology Progress Note    Primary Gastroenterologist:  Dr. Tasia Catchings  Patient ID: Betty Gonzalez; [id number]; Feb 07, 1951    Subjective   No abdominal pain. No overt GI bleeding. Denies nausea. Tolerating diet. Feels like she needs to have a BM.    Objective   Vital signs in last 24 hours Temp:  [97.6 F (36.4 C)-98.8 F (37.1 C)] 97.6 F (36.4 C) (02/18 0800) Pulse Rate:  [39-83] 83 (02/18 0202) Resp:  [12-23] 12 (02/18 0800) BP: (97-137)/(38-109) 112/58 (02/18 0800) SpO2:  [93 %-98 %] 96 % (02/18 0800) Weight:  [75.3 kg] 75.3 kg (02/18 0300) Last BM Date : 04/02/23  Physical Exam General:   Alert and oriented, pleasant Head:  Normocephalic and atraumatic. Eyes:  No icterus, sclera clear. Conjuctiva pink.  Abdomen:  Bowel sounds present, soft, non-tender, non-distended. No HSM or hernias noted. No rebound or guarding. No masses appreciated  Extremities:  Without edema. Neurologic:  Alert and  oriented x4   Intake/Output from previous day: 02/17 0701 - 02/18 0700 In: 240 [P.O.:240] Out: -  Intake/Output this shift: No intake/output data recorded.  Lab Results  Recent Labs    04/02/23 1651 04/03/23 0822 04/04/23 0340  WBC 7.3 5.5 5.5  HGB 8.8* 8.2* 8.0*  HCT 26.8* 24.9* 25.0*  PLT 143* 121* 133*   BMET Recent Labs    04/02/23 0615 04/03/23 0511  NA 140 137  K 3.8 3.5  CL 109 108  CO2 25 24  GLUCOSE 157* 130*  BUN 33* 19  CREATININE 0.74 0.76  CALCIUM 8.0* 7.7*   LFT Recent Labs    04/02/23 0615 04/03/23 0511  PROT 5.6* 5.3*  ALBUMIN 3.0* 2.8*  AST 30 36  ALT 23 25  ALKPHOS 84 77  BILITOT 0.9 0.9   PT/INR Recent Labs    04/02/23 0615 04/03/23 0839  LABPROT 16.6* 15.5*  INR 1.3* 1.2   Hepatitis Panel Recent Labs    04/02/23 0615  HEPBSAG NON REACTIVE  HCVAB NON REACTIVE  HEPAIGM NON REACTIVE  HEPBIGM NON REACTIVE     Studies/Results CT ABDOMEN PELVIS W CONTRAST Result Date: 04/01/2023 CLINICAL DATA:  Portal  hypertension and cirrhosis. Evaluate for portal vein thrombosis. Patient reports hematemesis. EXAM: CT ABDOMEN AND PELVIS WITH CONTRAST TECHNIQUE: Multidetector CT imaging of the abdomen and pelvis was performed using the standard protocol following bolus administration of intravenous contrast. RADIATION DOSE REDUCTION: This exam was performed according to the departmental dose-optimization program which includes automated exposure control, adjustment of the mA and/or kV according to patient size and/or use of iterative reconstruction technique. CONTRAST:  OMNIPAQUE IOHEXOL 300 MG/ML  SOLN COMPARISON:  Ultrasound 01/21/2021 FINDINGS: Lower chest: Wall thickening of the distal esophagus with areas of esophageal enhancement. No pleural effusion. Hepatobiliary: Cirrhotic hepatic morphology with nodular contours. Decreased hepatic density. No discrete focal hepatic lesion. Mild diffuse gallbladder wall thickening without abnormal gallbladder distension. No calcified gallstone. No biliary dilatation. The portal, splenic, and mesenteric veins are patent. Pancreas: No ductal dilatation or inflammation. Spleen: Normal in size without focal abnormality. Adrenals/Urinary Tract: No adrenal nodule. Cortical scarring in the upper and lower right kidney. Nonobstructing upper right renal stone. Right upper pole calyceal diverticulum. There is homogeneous renal enhancement with symmetric excretion on delayed phase imaging. Partially distended urinary bladder, normal for degree of distension. Stomach/Bowel: Distal esophageal wall thickening with areas of enhancement. The stomach is nondistended. No small bowel obstruction. Equivocal areas of bowel wall thickening within the distal small bowel, series  2, image 72. Normal appendix. Colonic diverticulosis without focal diverticulitis. There is mild wall thickening at the splenic flexure of the colon, series 2, image 22 in descending colon series 2 image 45. Vascular/Lymphatic:  The portal, splenic, and mesenteric veins are patent. No portal vein thrombus. Aortic atherosclerosis without aneurysm. 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 Prominent 13 mm porta hepatis node series 2, image 23. Prominent 11 mm portal caval node series 2, image 27. Reproductive: Calcifications in the lower uterus may represent lower uterine fibroids. No adnexal mass. Other: No ascites.  Diminutive fat containing umbilical hernia. Musculoskeletal: Prominent Schmorl's node inferior endplate of L4. Lower lumbar facet hypertrophy. The bones are under mineralized. There are no acute or suspicious osseous abnormalities. IMPRESSION: 1. Cirrhosis. No portal vein thrombus. 2. Wall thickening of the distal esophagus with areas of enhancement. This may represent esophageal varices. 3. Equivocal areas of bowel wall thickening within the distal small bowel, splenic flexure, and descending colon. Recommend correlation with any symptoms of enterocolitis. 4. Colonic diverticulosis without focal diverticulitis. 5. Nonobstructing right renal stone. Aortic Atherosclerosis (ICD10-I70.0). Electronically Signed   By: Narda Rutherford M.D.   On: 04/01/2023 16:55    Assessment  73 y.o. female with a history of DM, hyperlipidemia, Lyme disease, hematemesis due to variceal bleed and new diagnosis of cirrhosis.   Cirrhosis: decompensated with variceal bleed. MELD 3.0 yesterday with yesterday's labs was 10.  Acute hepatitis panel negative. Additional serologies ordered without iron overload, normal ceruloplasmin, ANA negative, ASMA and AMA normal, IgG normal, AFP tumor marker pending. Will need outpatient continued care.  UGI bleed: no further overt GI bleeding. Hgb 8.0 today. This afternoon will be 72 hours of Octreotide and can discontinue. She is stable for discharge home and will need to complete abx through 2/21, which can be changed to oral regimen.   Constipation: start Miralax daily.      Plan / Recommendations  Miralax daily PPI daily at discharge Complete 7 day course of antibiotics Will need outpatient follow-up for cirrhosis: will arrange Patient is concerned about starting carvedilol at discharge due to history of lower BPs historically. We can address this in early interval follow-up titrate for HR goal off 55-60  Appropriate from GI standpoint for discharge with close outpatient follow-up    LOS: 3 days    04/04/2023, 9:14 AM  Gelene Mink, PhD, ANP-BC [facility]

## 2023-04-04 NOTE — Telephone Encounter (Signed)
I called and left a message asked that the patient return call to the office.

## 2023-04-05 LAB — HEPATITIS B SURFACE ANTIBODY, QUANTITATIVE: Hep B S AB Quant (Post): <3.5 m[IU]/mL — ABNORMAL LOW

## 2023-04-05 LAB — AFP TUMOR MARKER: AFP, Serum, Tumor Marker: 2.8 ng/mL (ref 0.0–9.2)

## 2023-04-05 NOTE — Telephone Encounter (Signed)
 I spoke with the patient and made her aware per Sharlyne Pacas, NP with our office [facility], she would like for you to have your labs recheck either Thursday or Friday this week, which is 04/06/2023 or 04/07/2023. The lab is [facility] and there two locations here in [place]. One is located at [address], and the other is located at [address]. I have placed the orders for you to have your blood counts reevaluated.   Patient states understanding.

## 2023-04-07 LAB — CBC WITH DIFFERENTIAL/PLATELET
Basophils Absolute: 0.1 10*3/uL (ref 0.0–0.2)
Basos: 1 %
EOS (ABSOLUTE): 0.3 10*3/uL (ref 0.0–0.4)
Eos: 4 %
Hematocrit: 28 % — ABNORMAL LOW (ref 34.0–46.6)
Hemoglobin: 9.4 g/dL — ABNORMAL LOW (ref 11.1–15.9)
Immature Grans (Abs): 0 10*3/uL (ref 0.0–0.1)
Immature Granulocytes: 0 %
Lymphocytes Absolute: 2.1 10*3/uL (ref 0.7–3.1)
Lymphs: 31 %
MCH: 32.3 pg (ref 26.6–33.0)
MCHC: 33.6 g/dL (ref 31.5–35.7)
MCV: 96 fL (ref 79–97)
Monocytes Absolute: 1 10*3/uL — ABNORMAL HIGH (ref 0.1–0.9)
Monocytes: 15 %
Neutrophils Absolute: 3.3 10*3/uL (ref 1.4–7.0)
Neutrophils: 49 %
Platelets: 199 10*3/uL (ref 150–450)
RBC: 2.91 x10E6/uL — ABNORMAL LOW (ref 3.77–5.28)
RDW: 13.2 % (ref 11.7–15.4)
WBC: 6.8 10*3/uL (ref 3.4–10.8)

## 2023-04-17 ENCOUNTER — Encounter (INDEPENDENT_AMBULATORY_CARE_PROVIDER_SITE_OTHER): Payer: Self-pay

## 2023-04-18 ENCOUNTER — Encounter (INDEPENDENT_AMBULATORY_CARE_PROVIDER_SITE_OTHER): Payer: Self-pay | Admitting: Gastroenterology

## 2023-04-18 ENCOUNTER — Ambulatory Visit (INDEPENDENT_AMBULATORY_CARE_PROVIDER_SITE_OTHER): Payer: Medicare Other | Admitting: Gastroenterology

## 2023-04-18 VITALS — BP 114/66 | HR 81 | Temp 97.7°F | Ht 68.0 in | Wt 164.8 lb

## 2023-04-18 DIAGNOSIS — K7469 Other cirrhosis of liver: Secondary | ICD-10-CM

## 2023-04-18 DIAGNOSIS — R933 Abnormal findings on diagnostic imaging of other parts of digestive tract: Secondary | ICD-10-CM

## 2023-04-18 DIAGNOSIS — I8511 Secondary esophageal varices with bleeding: Secondary | ICD-10-CM

## 2023-04-18 DIAGNOSIS — F1090 Alcohol use, unspecified, uncomplicated: Secondary | ICD-10-CM | POA: Diagnosis not present

## 2023-04-18 DIAGNOSIS — Z8719 Personal history of other diseases of the digestive system: Secondary | ICD-10-CM

## 2023-04-18 DIAGNOSIS — K7460 Unspecified cirrhosis of liver: Secondary | ICD-10-CM | POA: Diagnosis not present

## 2023-04-18 NOTE — Patient Instructions (Addendum)
-  We will get you scheduled for repeat EGD and Colonoscopy -Please increase your carvedilol to twice daily, keep an eye on your heart rate and blood pressure -continue protonix 40mg  daily  - Reduce salt intake to <2 g per day - Can take Tylenol max of 2 g per day (650 mg q8h) for pain - Avoid NSAIDs for pain - Avoid eating raw oysters/shellfish - Ensure every night before going to sleep  Follow up 2 months  It was a pleasure to see you today. I want to create trusting relationships with patients and provide genuine, compassionate, and quality care. I truly value your feedback! please be on the lookout for a survey regarding your visit with me today. I appreciate your input about our visit and your time in completing this!    Zylee Marchiano L. Jeanmarie Hubert, MSN, APRN, AGNP-C Adult-Gerontology Nurse Practitioner [facility] at [facility]

## 2023-04-18 NOTE — Progress Notes (Unsigned)
 Referring Provider: Silvestre Mesi Primary Care Physician:  Ofilia Neas, PA-C Primary GI Physician: Dr. Tasia Catchings   Chief Complaint  Patient presents with   Hospitalization Follow-up    Patient here today due to a recent hospitalization due to an UGI bleed on 04/01/2023. Patient says she does not have any energy. Denies any chest pain, dizziness, no shortness of breath. April 06, 2023 hgb was 9.4. She is not currently taking an Fe Supplement. Fe sat % was 13% on 04/03/2023.   HPI:   Betty Gonzalez is a 73 y.o. female with past medical history of  diabetes, hyperlipidemia, history of Lyme disease   Patient presenting today for:  Hospital follow up of variceal bleed  Newly diagnosed cirrhosis   Patient seen by our team in the hospital on 04/01/2023 with reports of multiple episodes of vomiting with some fresh blood present.  Hemoglobin trended down from 14.5-10 .2, FOBT negative on 02/2023  Patient recommended to undergo EGD which was done 04/01/2023 with grade 3 esophageal varices with stigmata of recent bleeding, banded and eradicated, large hematin in the stomach, normal duodenal bulb  Recommended octreotide at 72 hours, Rocephin for SBP prophylaxis, carvedilol upon discharge, repeat upper endoscopy for 6 weeks, consider IR for consult for TIPS if rebleeding occurs.  Patient notably with no known history of cirrhosis  CT A/P w contrast: 2/15 Cirrhosis. No portal vein thrombus. 2. Wall thickening of the distal esophagus with areas of enhancement. This may represent esophageal varices. 3. Equivocal areas of bowel wall thickening within the distal small bowel, splenic flexure, and descending colon. Recommend correlation with any symptoms of enterocolitis. 4. Colonic diverticulosis without focal diverticulitis. 5. Nonobstructing right renal stone  LAST MELD 3.0: 10  Last CBC on 2/20 with hemoglobin 9.4, iron 37, TIBC 294, sat 13, ferritin 80 AFP tumor marker 2.8 Hep  BsAb without immunity Liver serologies negative other than elevated IgM of 336   Present:  Patient states she has no pain but is feeling weak still. She reports she has been taking carvedilol once daily, she has done fine on that and reports she took it twice daily x1 day but noted that her buttocks started itching and she was concerned she may be having a reaction to the medication.   She has been checking HR and BP, HR mostly in the 70s but has had a few in the mid to low 60s. BP has been running around 130s/60s. She denies SOB or dizziness. She denies rectal bleeding, melena. She is not having any constipation. Having a BM 1-2 times per day. Not currently on an iron pill as she was told she did not need this. Denies any nausea or vomiting. No episodes of confusion. She has no swelling to her abdomen or her legs.   Appetite has been good. She is trying to watch what she eats  She does not drink routinely.  Reports that she has recurrent UTIs since she was a teenager and had a standing Rx for cipro. No other obviously offending medications or chronic meds in the past.   Last Colonoscopy:>10 years  Last Endoscopy: As outlined above  Recommendations:  Repeat EGD 4-6 weeks   Filed Weights   04/18/23 1115  Weight: 164 lb 12.8 oz (74.8 kg)     Past Medical History:  Diagnosis Date   Chronic kidney disease    Diabetes mellitus without complication (HCC)    Hyperlipidemia    Lyme disease  Past Surgical History:  Procedure Laterality Date   ESOPHAGEAL BANDING  04/01/2023   Procedure: ESOPHAGEAL BANDING;  Surgeon: Franky Macho, MD;  Location: [facility];  Service: Endoscopy;;   ESOPHAGOGASTRODUODENOSCOPY (EGD) WITH PROPOFOL N/A 04/01/2023   Procedure: ESOPHAGOGASTRODUODENOSCOPY (EGD) WITH PROPOFOL;  Surgeon: Franky Macho, MD;  Location: [facility];  Service: Endoscopy;  Laterality: N/A;   NASAL SEPTUM SURGERY      Current Outpatient Medications  Medication Sig  Dispense Refill   carvedilol (COREG) 3.125 MG tablet Take 1 tablet daily for 1 week and monitor BP at home, if tolerated increase to 1 tablet twice daily 60 tablet 0   cetirizine (ZYRTEC) 10 MG chewable tablet Chew 10 mg by mouth daily.     cyanocobalamin (VITAMIN B12) 500 MCG tablet Take 500 mcg by mouth daily.     empagliflozin (JARDIANCE) 10 MG TABS tablet Take 10 mg by mouth daily.     escitalopram (LEXAPRO) 5 MG tablet Take 2.5 mg by mouth daily.     fluticasone (FLONASE) 50 MCG/ACT nasal spray Place 2 sprays into both nostrils daily. 16 g 6   LORazepam (ATIVAN) 1 MG tablet Take 1 tablet (1 mg total) by mouth daily as needed for anxiety. No refills until 09/29/2017 90 tablet 1   Multiple Vitamin (MULTIVITAMIN WITH MINERALS) TABS tablet Take 1 tablet by mouth daily.     pantoprazole (PROTONIX) 40 MG tablet Take 1 tablet (40 mg total) by mouth daily. 30 tablet 2   Zinc Sulfate (ZINC-220 PO) Take 1 tablet by mouth daily.     No current facility-administered medications for this visit.    Allergies as of 04/18/2023 - Review Complete 04/18/2023  Allergen Reaction Noted   Statins Rash 05/25/2018   Codeine  01/03/2008   Sulfonamide derivatives     Other Rash and Other (See Comments) 06/26/2013   Pedi-pre tape spray [wound dressing adhesive] Other (See Comments) 06/26/2013   Penicillins Rash    Silicone Rash 06/26/2013   Tape Rash and Other (See Comments) 06/26/2013    Social History   Socioeconomic History   Marital status: Single    Spouse name: Not on file   Number of children: Not on file   Years of education: Not on file   Highest education level: Not on file  Occupational History   Not on file  Tobacco Use   Smoking status: Former    Types: Cigars, Cigarettes   Smokeless tobacco: Never   Tobacco comments:    1 cigar occasion  Vaping Use   Vaping status: Never Used  Substance and Sexual Activity   Alcohol use: Yes    Comment: occasionally   Drug use: Yes    Types:  Marijuana   Sexual activity: Not Currently  Other Topics Concern   Not on file  Social History Narrative   Not on file   Social Drivers of Health   Financial Resource Strain: Low Risk  (02/05/2022)   Received from [facility], [facility]   Overall Financial Resource Strain (CARDIA)    Difficulty of Paying Living Expenses: Not hard at all  Food Insecurity: No Food Insecurity (04/01/2023)   Hunger Vital Sign    Worried About Running Out of Food in the Last Year: Never true    Ran Out of Food in the Last Year: Never true  Transportation Needs: No Transportation Needs (04/01/2023)   PRAPARE - Administrator, Civil Service (Medical): No  Lack of Transportation (Non-Medical): No  Physical Activity: Not on file  Stress: Not on file  Social Connections: Moderately Isolated (04/01/2023)   Social Connection and Isolation Panel [NHANES]    Frequency of Communication with Friends and Family: More than three times a week    Frequency of Social Gatherings with Friends and Family: More than three times a week    Attends Religious Services: 1 to 4 times per year    Active Member of [employer] or Organizations: No    Attends Engineer, structural: Never    Marital Status: Never married    Review of systems General: negative for night sweats, fever, chills, weight loss +fatigue Neck: Negative for lumps, goiter, pain and significant neck swelling Resp: Negative for cough, wheezing, dyspnea at rest CV: Negative for chest pain, leg swelling, palpitations, orthopnea GI: denies melena, hematochezia, nausea, vomiting, diarrhea, constipation, dysphagia, odyonophagia, early satiety or unintentional weight loss.  MSK: Negative for joint pain or swelling, back pain, and muscle pain. Derm: Negative for itching or rash Psych: Denies depression, anxiety, memory loss, confusion. No homicidal or suicidal ideation.  Heme: Negative for prolonged bleeding,  bruising easily, and swollen nodes. Endocrine: Negative for cold or heat intolerance, polyuria, polydipsia and goiter. Neuro: negative for tremor, gait imbalance, syncope and seizures. The remainder of the review of systems is noncontributory.  Physical Exam: BP 114/66 (BP Location: Left Arm, Patient Position: Sitting, Cuff Size: Normal)   Pulse 81   Temp 97.7 F (36.5 C) (Temporal)   Ht 5\' 8"  (1.727 m)   Wt 164 lb 12.8 oz (74.8 kg)   BMI 25.06 kg/m  General:   Alert and oriented. No distress noted. Pleasant and cooperative.  Head:  Normocephalic and atraumatic. Eyes:  Conjuctiva clear without scleral icterus. Mouth:  Oral mucosa pink and moist. Good dentition. No lesions. Heart: Normal rate and rhythm, s1 and s2 heart sounds present.  Lungs: Clear lung sounds in all lobes. Respirations equal and unlabored. Abdomen:  +BS, soft, non-tender and non-distended. No rebound or guarding. No HSM or masses noted. Derm: No palmar erythema or jaundice Msk:  Symmetrical without gross deformities. Normal posture. Extremities:  Without edema. Neurologic:  Alert and  oriented x4 Psych:  Alert and cooperative. Normal mood and affect.  Invalid input(s): "6 MONTHS"   ASSESSMENT: Betty Gonzalez is a 73 y.o. female presenting today for hospital follow up of esophageal variceal bleed and new findings of cirrhosis  Cirrhosis with esophageal varices : newly diagnosed after presenting to the hospital with hematemesis, EGD with EVs with recent stigmata of bleeding. etiology of cirrhosis is unclear. Imaging in 2022 was without liver changes though CT in 2005 showed fatty liver, therefore likely secondary to [facility]. She has been on cipro intermittently for years due to ongoing UTIs, this could possibly have been a liver offending medication over time as it has been known to cause hepatotoxicity. She has had no swelling to her abdomen or legs and denies any episodes of confusion. No melena, rectal bleeding,  nausea/vomiting/further hematemesis. MELD 3.0 in February was 10. She will be due for repeat EGD later this month to re evaluate for varices that were previously banded. As her HR readings at home and here today have not been at goal ( HR: 55- 60 bpm) with once daily carvedilol, I recommended she increase this to BID dosing.   I had a thorough Discussion with the patient and her husband regarding  indications and possible causes of cirrhosis with  the patient to include presence of esophageal varices and things to look for such as abdominal swelling, leg swelling, hyperammonemia(confusion), jaundice/itching, importance of complete cessation of alcohol and avoidance of NSAIDs. Discussed importance of regular labs/imaging Q6 months, as cirrhosis increases risk of HCC. Patient made aware that worsening edema, SOB, pruritus/jaundice, rectal bleeding/melena are s/s that he needs to make me aware of immediately. Any occurrence of hematemesis would require him proceeding to the ER as this is a medical emergency in presence of known liver disease and esophageal varices.   Abnormal CT imaging of colon: large and small bowel wall thickening, under distention vs. Edema, as outlined above on recent CT imaging during hospital admission. She denies any LGI symptoms. Last colonoscopy was atleast 10 years ago. Would recommend proceeding with Colonoscopy for further evaluation, patient is amenable to do this at time of repeat EGD.  Indications, risks and benefits of procedure discussed in detail with patient. Patient verbalized understanding and is in agreement to proceed with EGD/Colonoscopy.    PLAN:  -repeat EGD mid March, Colonoscopy at same time-ASA III  -PPI BID -increase carvedilol 3.125mg  BID, continue to monitor HR and BP -Reduce salt intake to <2 g per day - Can take Tylenol max of 2 g per day (650 mg q8h) for pain - Avoid NSAIDs for pain - Avoid eating raw oysters/shellfish - Ensure every night before  going to sleep -ED precautions for hematemesis  -consider RUQ [place] for HCC screening at follow up visit given recent CT A/P is not a sensitive modality for HCC screening -Hep B vaccine with PCP  All questions were answered, patient verbalized understanding and is in agreement with plan as outlined above.   Follow Up: 2 months   Cortney Mckinney L. Jeanmarie Hubert, MSN, APRN, AGNP-C Adult-Gerontology Nurse Practitioner [facility]

## 2023-04-20 DIAGNOSIS — Z8719 Personal history of other diseases of the digestive system: Secondary | ICD-10-CM | POA: Insufficient documentation

## 2023-04-24 ENCOUNTER — Telehealth (INDEPENDENT_AMBULATORY_CARE_PROVIDER_SITE_OTHER): Payer: Self-pay | Admitting: Gastroenterology

## 2023-04-24 NOTE — Telephone Encounter (Signed)
 Left message to return call to schedule EGD/TCS with Dr.Ahmed

## 2023-04-25 MED ORDER — PEG 3350-KCL-NA BICARB-NACL 420 G PO SOLR: 4000.0000 mL | Freq: Once | ORAL | 0 refills | Status: AC

## 2023-04-25 NOTE — Addendum Note (Signed)
 Addended by: Marlowe Shores on: 04/25/2023 08:10 AM   Modules accepted: Orders

## 2023-04-25 NOTE — Telephone Encounter (Signed)
 Pt returned call yesterday afternoon and scheduled for 05/26/23. Prep sent to pharmacy. Will mail instructions. No auth needed per [facility].

## 2023-05-08 ENCOUNTER — Telehealth (INDEPENDENT_AMBULATORY_CARE_PROVIDER_SITE_OTHER): Payer: Self-pay | Admitting: *Deleted

## 2023-05-08 NOTE — Telephone Encounter (Signed)
 Patient left vm asking for refill on carvedilol. Last seen in office on 04/18/23 and note states to -increase carvedilol 3.125mg  BID. I called patient to see which pharmacy and she said since she did not get a call back on Friday ( we leave at 11:30 and she called after that that she call her doctor at [facility] and he refilled the medication and she does not need at this time. She said she had enough til her next appt here and would discuss with Dr. Tasia Catchings at next appt.

## 2023-05-23 NOTE — Patient Instructions (Signed)
 Betty Gonzalez  05/23/2023     @PREFPERIOPPHARMACY @   Your procedure is scheduled on 05/26/2023.   Report to [facility] at  0600 A.M.   Call this number if you have problems the morning of surgery:  [phone]  If you experience any cold or flu symptoms such as cough, fever, chills, shortness of breath, etc. between now and your scheduled surgery, please notify us at the above number.   Remember:  Follow the diet and prep instructions given to you by the office.   You may drink clear liquids until  0330 am on 05/26/2023.    Clear liquids allowed are:                    Water, Juice (No red color; non-citric and without pulp; diabetics please choose diet or no sugar options), Carbonated beverages (diabetics please choose diet or no sugar options), Clear Tea (No creamer, milk, or cream, including half & half and powdered creamer), Black Coffee Only (No creamer, milk or cream, including half & half and powdered creamer), and Clear Sports drink (No red color; diabetics please choose diet or no sugar options)    Take these medicines the morning of surgery with A SIP OF WATER                 carvedilol, escitalopram, lorazapam, pantoprazole.     Do not wear jewelry, make-up or nail polish, including gel polish,  artificial nails, or any other type of covering on natural nails (fingers and  toes).  Do not wear lotions, powders, or perfumes, or deodorant.  Do not shave 48 hours prior to surgery.  Men may shave face and neck.  Do not bring valuables to the hospital.  [facility] is not responsible for any belongings or valuables.  Contacts, dentures or bridgework may not be worn into surgery.  Leave your suitcase in the car.  After surgery it may be brought to your room.  For patients admitted to the hospital, discharge time will be determined by your treatment team.  Patients discharged the day of surgery will not be allowed to drive home and must have someone with them for  24 hours.    Special instructions:   DO NOT smoke tobacco or vape for 24 hours before your procedure.  Please read over the following fact sheets that you were given. Anesthesia Post-op Instructions and Care and Recovery After Surgery       Upper Endoscopy, Adult, Care After After the procedure, it is common to have a sore throat. It is also common to have: Mild stomach pain or discomfort. Bloating. Nausea. Follow these instructions at home: The instructions below may help you care for yourself at home. Your health care provider may give you more instructions. If you have questions, ask your health care provider. If you were given a sedative during the procedure, it can affect you for several hours. Do not drive or operate machinery until your health care provider says that it is safe. If you will be going home right after the procedure, plan to have a responsible adult: Take you home from the hospital or clinic. You will not be allowed to drive. Care for you for the time you are told. Follow instructions from your health care provider about what you may eat and drink. Return to your normal activities as told by your health care provider. Ask your health care provider what activities are  safe for you. Take over-the-counter and prescription medicines only as told by your health care provider. Contact a health care provider if you: Have a sore throat that lasts longer than one day. Have trouble swallowing. Have a fever. Get help right away if you: Vomit blood or your vomit looks like coffee grounds. Have bloody, black, or tarry stools. Have a very bad sore throat or you cannot swallow. Have difficulty breathing or very bad pain in your chest or abdomen. These symptoms may be an emergency. Get help right away. Call 911. Do not wait to see if the symptoms will go away. Do not drive yourself to the hospital. Summary After the procedure, it is common to have a sore throat, mild  stomach discomfort, bloating, and nausea. If you were given a sedative during the procedure, it can affect you for several hours. Do not drive until your health care provider says that it is safe. Follow instructions from your health care provider about what you may eat and drink. Return to your normal activities as told by your health care provider. This information is not intended to replace advice given to you by your health care provider. Make sure you discuss any questions you have with your health care provider. Document Revised: 05/12/2021 Document Reviewed: 05/12/2021 Elsevier Patient Education  2024 Elsevier Inc. Colonoscopy, Adult, Care After The following information offers guidance on how to care for yourself after your procedure. Your health care provider may also give you more specific instructions. If you have problems or questions, contact your health care provider. What can I expect after the procedure? After the procedure, it is common to have: A small amount of blood in your stool for 24 hours after the procedure. Some gas. Mild cramping or bloating of your abdomen. Follow these instructions at home: Eating and drinking  Drink enough fluid to keep your urine pale yellow. Follow instructions from your health care provider about eating or drinking restrictions. Resume your normal diet as told by your health care provider. Avoid heavy or fried foods that are hard to digest. Activity Rest as told by your health care provider. Avoid sitting for a long time without moving. Get up to take short walks every 1-2 hours. This is important to improve blood flow and breathing. Ask for help if you feel weak or unsteady. Return to your normal activities as told by your health care provider. Ask your health care provider what activities are safe for you. Managing cramping and bloating  Try walking around when you have cramps or feel bloated. If directed, apply heat to your abdomen as  told by your health care provider. Use the heat source that your health care provider recommends, such as a moist heat pack or a heating pad. Place a towel between your skin and the heat source. Leave the heat on for 20-30 minutes. Remove the heat if your skin turns bright red. This is especially important if you are unable to feel pain, heat, or cold. You have a greater risk of getting burned. General instructions If you were given a sedative during the procedure, it can affect you for several hours. Do not drive or operate machinery until your health care provider says that it is safe. For the first 24 hours after the procedure: Do not sign important documents. Do not drink alcohol. Do your regular daily activities at a slower pace than normal. Eat soft foods that are easy to digest. Take over-the-counter and prescription medicines only as told  by your health care provider. Keep all follow-up visits. This is important. Contact a health care provider if: You have blood in your stool 2-3 days after the procedure. Get help right away if: You have more than a small spotting of blood in your stool. You have large blood clots in your stool. You have swelling of your abdomen. You have nausea or vomiting. You have a fever. You have increasing pain in your abdomen that is not relieved with medicine. These symptoms may be an emergency. Get help right away. Call 911. Do not wait to see if the symptoms will go away. Do not drive yourself to the hospital. Summary After the procedure, it is common to have a small amount of blood in your stool. You may also have mild cramping and bloating of your abdomen. If you were given a sedative during the procedure, it can affect you for several hours. Do not drive or operate machinery until your health care provider says that it is safe. Get help right away if you have a lot of blood in your stool, nausea or vomiting, a fever, or increased pain in your  abdomen. This information is not intended to replace advice given to you by your health care provider. Make sure you discuss any questions you have with your health care provider. Document Revised: 03/15/2022 Document Reviewed: 09/23/2020 Elsevier Patient Education  2024 Elsevier Inc.General Anesthesia, Adult, Care After The following information offers guidance on how to care for yourself after your procedure. Your health care provider may also give you more specific instructions. If you have problems or questions, contact your health care provider. What can I expect after the procedure? After the procedure, it is common for people to: Have pain or discomfort at the IV site. Have nausea or vomiting. Have a sore throat or hoarseness. Have trouble concentrating. Feel cold or chills. Feel weak, sleepy, or tired (fatigue). Have soreness and body aches. These can affect parts of the body that were not involved in surgery. Follow these instructions at home: For the time period you were told by your health care provider:  Rest. Do not participate in activities where you could fall or become injured. Do not drive or use machinery. Do not drink alcohol. Do not take sleeping pills or medicines that cause drowsiness. Do not make important decisions or sign legal documents. Do not take care of children on your own. General instructions Drink enough fluid to keep your urine pale yellow. If you have sleep apnea, surgery and certain medicines can increase your risk for breathing problems. Follow instructions from your health care provider about wearing your sleep device: Anytime you are sleeping, including during daytime naps. While taking prescription pain medicines, sleeping medicines, or medicines that make you drowsy. Return to your normal activities as told by your health care provider. Ask your health care provider what activities are safe for you. Take over-the-counter and prescription  medicines only as told by your health care provider. Do not use any products that contain nicotine or tobacco. These products include cigarettes, chewing tobacco, and vaping devices, such as e-cigarettes. These can delay incision healing after surgery. If you need help quitting, ask your health care provider. Contact a health care provider if: You have nausea or vomiting that does not get better with medicine. You vomit every time you eat or drink. You have pain that does not get better with medicine. You cannot urinate or have bloody urine. You develop a skin rash. You have  a fever. Get help right away if: You have trouble breathing. You have chest pain. You vomit blood. These symptoms may be an emergency. Get help right away. Call 911. Do not wait to see if the symptoms will go away. Do not drive yourself to the hospital. Summary After the procedure, it is common to have a sore throat, hoarseness, nausea, vomiting, or to feel weak, sleepy, or fatigue. For the time period you were told by your health care provider, do not drive or use machinery. Get help right away if you have difficulty breathing, have chest pain, or vomit blood. These symptoms may be an emergency. This information is not intended to replace advice given to you by your health care provider. Make sure you discuss any questions you have with your health care provider. Document Revised: 04/30/2021 Document Reviewed: 04/30/2021 Elsevier Patient Education  2024 [employer].

## 2023-05-24 ENCOUNTER — Encounter (HOSPITAL_COMMUNITY)
Admission: RE | Admit: 2023-05-24 | Discharge: 2023-05-24 | Disposition: A | Discharge status: Home / Self Care | Source: Ambulatory Visit | Attending: Gastroenterology | Admitting: Gastroenterology

## 2023-05-24 ENCOUNTER — Encounter (HOSPITAL_COMMUNITY): Payer: Self-pay

## 2023-05-24 VITALS — BP 117/54 | HR 63 | Temp 98.0°F | Resp 18 | Ht 68.0 in | Wt 164.0 lb

## 2023-05-24 DIAGNOSIS — D696 Thrombocytopenia, unspecified: Secondary | ICD-10-CM | POA: Insufficient documentation

## 2023-05-24 DIAGNOSIS — I491 Atrial premature depolarization: Secondary | ICD-10-CM | POA: Diagnosis not present

## 2023-05-24 DIAGNOSIS — E119 Type 2 diabetes mellitus without complications: Secondary | ICD-10-CM | POA: Diagnosis not present

## 2023-05-24 DIAGNOSIS — Z01818 Encounter for other preprocedural examination: Secondary | ICD-10-CM | POA: Insufficient documentation

## 2023-05-24 DIAGNOSIS — K7469 Other cirrhosis of liver: Secondary | ICD-10-CM | POA: Insufficient documentation

## 2023-05-24 HISTORY — DX: Anxiety disorder, unspecified: F41.9

## 2023-05-24 HISTORY — DX: Cardiac murmur, unspecified: R01.1

## 2023-05-24 LAB — CBC WITH DIFFERENTIAL/PLATELET
Abs Immature Granulocytes: 0 10*3/uL (ref 0.00–0.07)
Basophils Absolute: 0 10*3/uL (ref 0.0–0.1)
Basophils Relative: 1 %
Eosinophils Absolute: 0.2 10*3/uL (ref 0.0–0.5)
Eosinophils Relative: 4 %
HCT: 34 % — ABNORMAL LOW (ref 36.0–46.0)
Hemoglobin: 10.9 g/dL — ABNORMAL LOW (ref 12.0–15.0)
Immature Granulocytes: 0 %
Lymphocytes Relative: 29 %
Lymphs Abs: 1.5 10*3/uL (ref 0.7–4.0)
MCH: 29.4 pg (ref 26.0–34.0)
MCHC: 32.1 g/dL (ref 30.0–36.0)
MCV: 91.6 fL (ref 80.0–100.0)
Monocytes Absolute: 0.7 10*3/uL (ref 0.1–1.0)
Monocytes Relative: 13 %
Neutro Abs: 2.8 10*3/uL (ref 1.7–7.7)
Neutrophils Relative %: 53 %
Platelets: 146 10*3/uL — ABNORMAL LOW (ref 150–400)
RBC: 3.71 MIL/uL — ABNORMAL LOW (ref 3.87–5.11)
RDW: 13.9 % (ref 11.5–15.5)
WBC: 5.2 10*3/uL (ref 4.0–10.5)
nRBC: 0 % (ref 0.0–0.2)

## 2023-05-24 LAB — COMPREHENSIVE METABOLIC PANEL WITH GFR
ALT: 25 U/L (ref 0–44)
AST: 33 U/L (ref 15–41)
Albumin: 3.4 g/dL — ABNORMAL LOW (ref 3.5–5.0)
Alkaline Phosphatase: 107 U/L (ref 38–126)
Anion gap: 10 (ref 5–15)
BUN: 18 mg/dL (ref 8–23)
CO2: 22 mmol/L (ref 22–32)
Calcium: 9 mg/dL (ref 8.9–10.3)
Chloride: 104 mmol/L (ref 98–111)
Creatinine, Ser: 0.8 mg/dL (ref 0.44–1.00)
GFR, Estimated: >60 mL/min (ref >60)
Glucose, Bld: 175 mg/dL — ABNORMAL HIGH (ref 70–99)
Potassium: 3.9 mmol/L (ref 3.5–5.1)
Sodium: 136 mmol/L (ref 135–145)
Total Bilirubin: 0.8 mg/dL (ref 0.0–1.2)
Total Protein: 7.3 g/dL (ref 6.5–8.1)

## 2023-05-24 LAB — PROTIME-INR
INR: 1.1 (ref 0.8–1.2)
Prothrombin Time: 14.5 s (ref 11.4–15.2)

## 2023-05-26 ENCOUNTER — Telehealth (INDEPENDENT_AMBULATORY_CARE_PROVIDER_SITE_OTHER): Payer: Self-pay | Admitting: Gastroenterology

## 2023-05-26 ENCOUNTER — Encounter (HOSPITAL_COMMUNITY)
Admission: RE | Disposition: A | Discharge status: Home / Self Care | Payer: Self-pay | Source: Home / Self Care | Attending: Gastroenterology

## 2023-05-26 ENCOUNTER — Ambulatory Visit (HOSPITAL_COMMUNITY): Admitting: Anesthesiology

## 2023-05-26 ENCOUNTER — Encounter (INDEPENDENT_AMBULATORY_CARE_PROVIDER_SITE_OTHER): Payer: Self-pay | Admitting: *Deleted

## 2023-05-26 ENCOUNTER — Other Ambulatory Visit: Payer: Self-pay

## 2023-05-26 ENCOUNTER — Ambulatory Visit (HOSPITAL_BASED_OUTPATIENT_CLINIC_OR_DEPARTMENT_OTHER): Admitting: Anesthesiology

## 2023-05-26 ENCOUNTER — Encounter (HOSPITAL_COMMUNITY): Payer: Self-pay | Admitting: Gastroenterology

## 2023-05-26 ENCOUNTER — Ambulatory Visit (HOSPITAL_COMMUNITY)
Admission: RE | Admit: 2023-05-26 | Discharge: 2023-05-26 | Disposition: A | Discharge status: Home / Self Care | Attending: Gastroenterology | Admitting: Gastroenterology

## 2023-05-26 DIAGNOSIS — K635 Polyp of colon: Secondary | ICD-10-CM

## 2023-05-26 DIAGNOSIS — K5730 Diverticulosis of large intestine without perforation or abscess without bleeding: Secondary | ICD-10-CM | POA: Diagnosis not present

## 2023-05-26 DIAGNOSIS — D122 Benign neoplasm of ascending colon: Secondary | ICD-10-CM | POA: Diagnosis not present

## 2023-05-26 DIAGNOSIS — I8510 Secondary esophageal varices without bleeding: Secondary | ICD-10-CM | POA: Diagnosis not present

## 2023-05-26 DIAGNOSIS — E785 Hyperlipidemia, unspecified: Secondary | ICD-10-CM | POA: Insufficient documentation

## 2023-05-26 DIAGNOSIS — E1122 Type 2 diabetes mellitus with diabetic chronic kidney disease: Secondary | ICD-10-CM | POA: Diagnosis not present

## 2023-05-26 DIAGNOSIS — N189 Chronic kidney disease, unspecified: Secondary | ICD-10-CM | POA: Insufficient documentation

## 2023-05-26 DIAGNOSIS — K648 Other hemorrhoids: Secondary | ICD-10-CM | POA: Diagnosis not present

## 2023-05-26 DIAGNOSIS — D123 Benign neoplasm of transverse colon: Secondary | ICD-10-CM | POA: Diagnosis not present

## 2023-05-26 DIAGNOSIS — K3189 Other diseases of stomach and duodenum: Secondary | ICD-10-CM | POA: Diagnosis not present

## 2023-05-26 DIAGNOSIS — I129 Hypertensive chronic kidney disease with stage 1 through stage 4 chronic kidney disease, or unspecified chronic kidney disease: Secondary | ICD-10-CM | POA: Diagnosis not present

## 2023-05-26 DIAGNOSIS — D120 Benign neoplasm of cecum: Secondary | ICD-10-CM

## 2023-05-26 DIAGNOSIS — D125 Benign neoplasm of sigmoid colon: Secondary | ICD-10-CM | POA: Diagnosis not present

## 2023-05-26 DIAGNOSIS — K7460 Unspecified cirrhosis of liver: Secondary | ICD-10-CM | POA: Insufficient documentation

## 2023-05-26 DIAGNOSIS — I10 Essential (primary) hypertension: Secondary | ICD-10-CM

## 2023-05-26 DIAGNOSIS — F419 Anxiety disorder, unspecified: Secondary | ICD-10-CM | POA: Insufficient documentation

## 2023-05-26 DIAGNOSIS — D631 Anemia in chronic kidney disease: Secondary | ICD-10-CM | POA: Insufficient documentation

## 2023-05-26 DIAGNOSIS — K319 Disease of stomach and duodenum, unspecified: Secondary | ICD-10-CM | POA: Insufficient documentation

## 2023-05-26 DIAGNOSIS — I8500 Esophageal varices without bleeding: Secondary | ICD-10-CM | POA: Diagnosis not present

## 2023-05-26 DIAGNOSIS — R933 Abnormal findings on diagnostic imaging of other parts of digestive tract: Secondary | ICD-10-CM | POA: Diagnosis present

## 2023-05-26 DIAGNOSIS — K2970 Gastritis, unspecified, without bleeding: Secondary | ICD-10-CM

## 2023-05-26 DIAGNOSIS — Z87891 Personal history of nicotine dependence: Secondary | ICD-10-CM | POA: Diagnosis not present

## 2023-05-26 DIAGNOSIS — D124 Benign neoplasm of descending colon: Secondary | ICD-10-CM

## 2023-05-26 DIAGNOSIS — N200 Calculus of kidney: Secondary | ICD-10-CM | POA: Insufficient documentation

## 2023-05-26 HISTORY — PX: COLONOSCOPY: SHX5424

## 2023-05-26 HISTORY — PX: ESOPHAGOGASTRODUODENOSCOPY: SHX5428

## 2023-05-26 LAB — GLUCOSE, CAPILLARY: Glucose-Capillary: 110 mg/dL — ABNORMAL HIGH (ref 70–99)

## 2023-05-26 LAB — HM COLONOSCOPY

## 2023-05-26 SURGERY — COLONOSCOPY
Anesthesia: General

## 2023-05-26 MED ORDER — LIDOCAINE HCL (CARDIAC) PF 100 MG/5ML IV SOSY
PREFILLED_SYRINGE | INTRAVENOUS | Status: DC | PRN
  Administered 2023-05-26: 80 mg via INTRATRACHEAL

## 2023-05-26 MED ORDER — LACTATED RINGERS IV SOLN: INTRAVENOUS | Status: DC

## 2023-05-26 MED ORDER — PROPOFOL 10 MG/ML IV BOLUS
INTRAVENOUS | Status: DC | PRN
  Administered 2023-05-26: 120 ug/kg/min via INTRAVENOUS
  Administered 2023-05-26 (×2): 20 mg via INTRAVENOUS
  Administered 2023-05-26: 150 ug/kg/min via INTRAVENOUS
  Administered 2023-05-26: 130 mg via INTRAVENOUS

## 2023-05-26 MED ORDER — PHENYLEPHRINE 80 MCG/ML (10ML) SYRINGE FOR IV PUSH (FOR BLOOD PRESSURE SUPPORT)
PREFILLED_SYRINGE | INTRAVENOUS | Status: DC | PRN
  Administered 2023-05-26 (×3): 80 ug via INTRAVENOUS

## 2023-05-26 MED ORDER — PANTOPRAZOLE SODIUM 40 MG PO TBEC: 40.0000 mg | DELAYED_RELEASE_TABLET | Freq: Every day | ORAL | 2 refills | Status: DC

## 2023-05-26 MED ORDER — CARVEDILOL 3.125 MG PO TABS: 3.1250 mg | ORAL_TABLET | Freq: Two times a day (BID) | ORAL | 0 refills | Status: DC

## 2023-05-26 NOTE — Telephone Encounter (Signed)
 Betty Macho, MD  Marlowe Shores, LPN; Zada Finders,  Can you please schedule a EGD in 8 weeks? Dx: Esophageal varices. Room: 3   Hi Darl Pikes,  Can you please schedule a follow up appointment for this patient in 3 months with me or any of the APPs?  Thanks,  Vista Lawman, MD Gastroenterology and Hepatology [facility]\   Will call pt once I have June schedule since 8 weeks will be around 07/21/23

## 2023-05-26 NOTE — H&P (Signed)
 Primary Care Physician:  Ofilia Neas, PA-C Primary Gastroenterologist:  Dr. Tasia Catchings  Pre-Procedure History & Physical: HPI: Betty Gonzalez is a 73 y.o. female with past medical history of  diabetes, hyperlipidemia, history of Lyme disease , cirhhosis is here for esophageal varices surveillance given recent history of GI bleed and colonoscopy for abnormal CT     Patient denies any family history of colorectal cancer.  No melena or hematochezia.  No abdominal pain or unintentional weight loss.  No change in bowel habits.  Overall feels well from a GI standpoint.    CT A/P w contrast: 2/15 Cirrhosis. No portal vein thrombus. 2. Wall thickening of the distal esophagus with areas of enhancement. This may represent esophageal varices. 3. Equivocal areas of bowel wall thickening within the distal small bowel, splenic flexure, and descending colon. Recommend correlation with any symptoms of enterocolitis. 4. Colonic diverticulosis without focal diverticulitis. 5. Nonobstructing right renal stone    Past Medical History:  Diagnosis Date   Anxiety    Chronic kidney disease    Diabetes mellitus without complication (HCC)    Heart murmur    Hyperlipidemia    Lyme disease     Past Surgical History:  Procedure Laterality Date   DILATION AND CURETTAGE OF UTERUS     ESL Lithotripsy     ESOPHAGEAL BANDING  04/01/2023   Procedure: ESOPHAGEAL BANDING;  Surgeon: Franky Macho, MD;  Location: [facility];  Service: Endoscopy;;   ESOPHAGOGASTRODUODENOSCOPY (EGD) WITH PROPOFOL N/A 04/01/2023   Procedure: ESOPHAGOGASTRODUODENOSCOPY (EGD) WITH PROPOFOL;  Surgeon: Franky Macho, MD;  Location: [facility];  Service: Endoscopy;  Laterality: N/A;   NASAL SEPTUM SURGERY     TONSILLECTOMY      Prior to Admission medications   Medication Sig Start Date End Date Taking? Authorizing Provider  carvedilol (COREG) 3.125 MG tablet Take 1 tablet daily for 1 week and monitor BP at home, if  tolerated increase to 1 tablet twice daily 04/04/23  Yes Johnson, Clanford L, MD  cetirizine (ZYRTEC) 10 MG chewable tablet Chew 10 mg by mouth daily.   Yes [provider]  clindamycin (CLEOCIN) 150 MG capsule Take 150 mg by mouth in the morning and at bedtime.   Yes [provider]  cyanocobalamin (VITAMIN B12) 500 MCG tablet Take 500 mcg by mouth daily.   Yes [provider]  empagliflozin (JARDIANCE) 10 MG TABS tablet Take 10 mg by mouth daily.   Yes [provider]  escitalopram (LEXAPRO) 5 MG tablet Take 2.5 mg by mouth daily.   Yes [provider]  Multiple Vitamin (MULTIVITAMIN WITH MINERALS) TABS tablet Take 1 tablet by mouth daily.   Yes [provider]  niacin 500 MG CR capsule Take 500 mg by mouth at bedtime.   Yes [provider]  pantoprazole (PROTONIX) 40 MG tablet Take 1 tablet (40 mg total) by mouth daily. 04/04/23  Yes Johnson, Clanford L, MD  Zinc Sulfate (ZINC-220 PO) Take 1 tablet by mouth daily.   Yes [provider]  fluticasone (FLONASE) 50 MCG/ACT nasal spray Place 2 sprays into both nostrils daily. 09/25/17   Ofilia Neas, PA-C  LORazepam (ATIVAN) 1 MG tablet Take 1 tablet (1 mg total) by mouth daily as needed for anxiety. No refills until 09/29/2017 09/25/17   Ofilia Neas, PA-C    Allergies as of 04/24/2023 - Review Complete 04/18/2023  Allergen Reaction Noted   Statins Rash 05/25/2018   Codeine  01/03/2008  Sulfonamide derivatives     Other Rash and Other (See Comments) 06/26/2013   Pedi-pre tape spray [wound dressing adhesive] Other (See Comments) 06/26/2013   Penicillins Rash    Silicone Rash 06/26/2013   Tape Rash and Other (See Comments) 06/26/2013    Family History  Problem Relation Age of Onset   Dementia Mother    Healthy Father     Social History   Socioeconomic History   Marital status: Single    Spouse name: Not on file   Number of children: Not on file   Years of  education: Not on file   Highest education level: Not on file  Occupational History   Not on file  Tobacco Use   Smoking status: Former    Types: Cigars, Cigarettes   Smokeless tobacco: Never   Tobacco comments:    1 cigar occasion  Vaping Use   Vaping status: Never Used  Substance and Sexual Activity   Alcohol use: Yes    Comment: occasionally   Drug use: Yes    Types: Marijuana   Sexual activity: Not Currently  Other Topics Concern   Not on file  Social History Narrative   Not on file   Social Drivers of Health   Financial Resource Strain: Low Risk  (02/05/2022)   Received from [facility]   Overall Financial Resource Strain (CARDIA)    Difficulty of Paying Living Expenses: Not hard at all  Food Insecurity: No Food Insecurity (04/01/2023)   Hunger Vital Sign    Worried About Running Out of Food in the Last Year: Never true    Ran Out of Food in the Last Year: Never true  Transportation Needs: No Transportation Needs (04/01/2023)   PRAPARE - Administrator, Civil Service (Medical): No    Lack of Transportation (Non-Medical): No  Physical Activity: Not on file  Stress: Not on file  Social Connections: Moderately Isolated (04/01/2023)   Social Connection and Isolation Panel [NHANES]    Frequency of Communication with Friends and Family: More than three times a week    Frequency of Social Gatherings with Friends and Family: More than three times a week    Attends Religious Services: 1 to 4 times per year    Active Member of Clubs or Organizations: No    Attends Banker Meetings: Never    Marital Status: Never married  Intimate Partner Violence: Not At Risk (04/01/2023)   Humiliation, Afraid, Rape, and Kick questionnaire    Fear of Current or Ex-Partner: No    Emotionally Abused: No    Physically Abused: No    Sexually Abused: No    Review of Systems: See HPI, otherwise negative ROS  Physical  Exam: Vital signs in last 24 hours: Temp:  [97.9 F (36.6 C)] 97.9 F (36.6 C) (04/11 0653) Pulse Rate:  [61] 61 (04/11 0653) Resp:  [14] 14 (04/11 0653) BP: (123)/(56) 123/56 (04/11 0653) SpO2:  [97 %] 97 % (04/11 0653) Weight:  [74.4 kg] 74.4 kg (04/11 0653)   General:   Alert,  Well-developed, well-nourished, pleasant and cooperative in NAD Head:  Normocephalic and atraumatic. Eyes:  Sclera clear, no icterus.   Conjunctiva pink. Ears:  Normal auditory acuity. Nose:  No deformity, discharge,  or lesions. Msk:  Symmetrical without gross deformities. Normal posture. Extremities:  Without clubbing or edema. Neurologic:  Alert and  oriented x4;  grossly normal neurologically. Skin:  Intact without significant lesions  or rashes. Psych:  Alert and cooperative. Normal mood and affect.  Impression/Plan: Betty Gonzalez is a 73 y.o. female with past medical history of  diabetes, hyperlipidemia, history of Lyme disease , cirhhosis is here for esophageal varices surveillance given recent history of GI bleed and colonoscopy for abnormal CT   The risks of the procedure including infection, bleed, or perforation as well as benefits, limitations, alternatives and imponderables have been reviewed with the patient. Questions have been answered. All parties agreeable.

## 2023-05-26 NOTE — Op Note (Signed)
 [facility] Patient Name: Betty Gonzalez Procedure Date: 05/26/2023 7:14 AM MRN: [id number] Date of Birth: 1950/09/08 Attending MD: Sanjuan Dame , MD, [id number] CSN: [id number] Age: 73 Admit Type: Outpatient Procedure:                Upper GI endoscopy Indications:              Follow-up of esophageal varices, For therapy of                            esophageal varices Providers:                Sanjuan Dame, MD, Angelica Ran, Crystal Page,                            Lennice Sites Technician, Technician Referring MD:              Medicines:                Monitored Anesthesia Care Complications:            No immediate complications. Estimated Blood Loss:     Estimated blood loss: none. Procedure:                Pre-Anesthesia Assessment:                           - Prior to the procedure, a History and Physical                            was performed, and patient medications and                            allergies were reviewed. The patient's tolerance of                            previous anesthesia was also reviewed. The risks                            and benefits of the procedure and the sedation                            options and risks were discussed with the patient.                            All questions were answered, and informed consent                            was obtained. Prior Anticoagulants: The patient has                            taken no anticoagulant or antiplatelet agents. ASA                            Grade Assessment: III - A patient with severe  systemic disease. After reviewing the risks and                            benefits, the patient was deemed in satisfactory                            condition to undergo the procedure.                           After obtaining informed consent, the endoscope was                            passed under direct vision. Throughout the                            procedure, the  patient's blood pressure, pulse, and                            oxygen saturations were monitored continuously. The                            [id number]-[id number] ([id number]) scope was introduced through the                            mouth, and advanced to the second part of duodenum.                            The upper GI endoscopy was accomplished without                            difficulty. The patient tolerated the procedure                            well. Scope In: 7:46:52 AM Scope Out: 7:56:57 AM Total Procedure Duration: 0 hours 10 minutes 5 seconds  Findings:      Grade III varices were found in the lower third of the esophagus. Four       bands were successfully placed with complete eradication, resulting in       deflation of varices. There was no bleeding during the procedure.      Mildly erythematous mucosa without bleeding was found in the gastric       antrum. This was biopsied with a cold forceps for histology.      The duodenal bulb and second portion of the duodenum were normal. Impression:               - Grade III esophageal varices. Completely                            eradicated. Banded.                           - Erythematous mucosa in the antrum. Biopsied.                           - Normal duodenal bulb  and second portion of the                            duodenum. Moderate Sedation:      Per Anesthesia Care Recommendation:           - Patient has a contact number available for                            emergencies. The signs and symptoms of potential                            delayed complications were discussed with the                            patient. Return to normal activities tomorrow.                            Written discharge instructions were provided to the                            patient.                           - Resume previous diet.                           - Continue present medications.                           - Await pathology  results.                           - Repeat upper endoscopy in 8 weeks to evaluate the                            response to therapy, per protocol, for retreatment                            and for surveillance.                           - Return to GI clinic as previously scheduled.                           -Increase Carvedilol 3.125mg  twice daily , check BP                            and HR daily to titrate up                           -Protonix once daily for 8 weeks                           -Avoid NSAIDs                           -  Soft diet for 2 days Procedure Code(s):        --- Professional ---                           [phone], Esophagogastroduodenoscopy, flexible,                            transoral; with band ligation of esophageal/gastric                            varices                           [id number], Esophagogastroduodenoscopy, flexible,                            transoral; with biopsy, single or multiple Diagnosis Code(s):        --- Professional ---                           I85.00, Esophageal varices without bleeding                           K31.89, Other diseases of stomach and duodenum CPT copyright 2022 American Medical Association. All rights reserved. The codes documented in this report are preliminary and upon coder review may  be revised to meet current compliance requirements. Sanjuan Dame, MD Sanjuan Dame, MD 05/26/2023 8:49:37 AM This report has been signed electronically. Number of Addenda: 0

## 2023-05-26 NOTE — Transfer of Care (Signed)
 Immediate Anesthesia Transfer of Care Note  Patient: Betty Gonzalez  Procedure(s) Performed: COLONOSCOPY EGD (ESOPHAGOGASTRODUODENOSCOPY)  Patient Location: Short Stay  Anesthesia Type:General  Level of Consciousness: awake and patient cooperative  Airway & Oxygen Therapy: Patient Spontanous Breathing  Post-op Assessment: Report given to RN and Post -op Vital signs reviewed and stable  Post vital signs: Reviewed and stable  Last Vitals:  Vitals Value Taken Time  BP 106/93 05/26/23 0850  Temp 36.4 C 05/26/23 0850  Pulse 73 05/26/23 0850  Resp 17 05/26/23 0850  SpO2 100 % 05/26/23 0850    Last Pain:  Vitals:   05/26/23 0850  TempSrc: Axillary  PainSc: 0-No pain      Patients Stated Pain Goal: 7 (05/26/23 0653)  Complications: No notable events documented.

## 2023-05-26 NOTE — Op Note (Signed)
 [facility] Patient Name: Betty Gonzalez Procedure Date: 05/26/2023 7:06 AM MRN: [id number] Date of Birth: [birth year] Attending MD: Sanjuan Dame , MD, [id number] CSN: [id number] Age: 73 Admit Type: Outpatient Procedure:                Colonoscopy Indications:              Abnormal CT of the GI tract Providers:                Sanjuan Dame, MD, Angelica Ran, Crystal Page,                            Lennice Sites Technician, Technician Referring MD:              Medicines:                Monitored Anesthesia Care Complications:            No immediate complications. Estimated Blood Loss:     Estimated blood loss was minimal. Procedure:                Pre-Anesthesia Assessment:                           - Prior to the procedure, a History and Physical                            was performed, and patient medications and                            allergies were reviewed. The patient's tolerance of                            previous anesthesia was also reviewed. The risks                            and benefits of the procedure and the sedation                            options and risks were discussed with the patient.                            All questions were answered, and informed consent                            was obtained. Prior Anticoagulants: The patient has                            taken no anticoagulant or antiplatelet agents. ASA                            Grade Assessment: III - A patient with severe                            systemic disease. After reviewing the risks and  benefits, the patient was deemed in satisfactory                            condition to undergo the procedure.                           After obtaining informed consent, the colonoscope                            was passed under direct vision. Throughout the                            procedure, the patient's blood pressure, pulse, and                             oxygen saturations were monitored continuously. The                            [id number]) scope was introduced through the                            anus and advanced to the the cecum, identified by                            appendiceal orifice and ileocecal valve. The                            colonoscopy was performed without difficulty. The                            patient tolerated the procedure well. The quality                            of the bowel preparation was evaluated using the                            BBPS [facility] Bowel Preparation Scale) with scores                            of: Right Colon = 3, Transverse Colon = 3 and Left                            Colon = 3 (entire mucosa seen well with no residual                            staining, small fragments of stool or opaque                            liquid). The total BBPS score equals 9. The                            ileocecal valve, appendiceal orifice, and rectum  were photographed. Scope In: 8:03:13 AM Scope Out: 8:45:28 AM Scope Withdrawal Time: 0 hours 37 minutes 30 seconds  Total Procedure Duration: 0 hours 42 minutes 15 seconds  Findings:      The perianal and digital rectal examinations were normal.      A 15 mm polyp was found in the ascending colon. The polyp was flat.       Preparations were made for mucosal resection. Demarcation of the lesion       was performed with high-definition white light and narrow band imaging       to clearly identify the boundaries of the lesion. Eleview was injected       to raise the lesion. Snare mucosal resection was performed. Resection       and retrieval were complete. Resected tissue margins were examined and       clear of polyp tissue.      A 20 mm polyp was found in the descending colon. The polyp was flat.       Preparations were made for mucosal resection. Demarcation of the lesion       was performed with high-definition  white light and narrow band imaging       to clearly identify the boundaries of the lesion. Eleview was injected       to raise the lesion. Snare mucosal resection was performed. Resection       and retrieval were complete. Resected tissue margins were examined and       clear of polyp tissue.      Five sessile polyps were found in the sigmoid colon, transverse colon       and cecum. The polyps were 5 to 8 mm in size. These polyps were removed       with a cold snare. Resection and retrieval were complete.      Scattered medium-mouthed diverticula were found in the entire colon.      Non-bleeding internal hemorrhoids were found during retroflexion. The       hemorrhoids were small. Impression:               - One 15 mm polyp in the ascending colon, removed                            with mucosal resection. Resected and retrieved.                           - One 20 mm polyp in the descending colon, removed                            with mucosal resection. Resected and retrieved.                           - Five 5 to 8 mm polyps in the sigmoid colon, in                            the transverse colon and in the cecum, removed with                            a cold snare. Resected and retrieved.                           -  Diverticulosis in the entire examined colon.                           - Non-bleeding internal hemorrhoids.                           - Mucosal resection was performed. Resection and                            retrieval were complete.                           - Mucosal resection was performed. Resection and                            retrieval were complete. Moderate Sedation:      Per Anesthesia Care Recommendation:           - Patient has a contact number available for                            emergencies. The signs and symptoms of potential                            delayed complications were discussed with the                            patient. Return to normal  activities tomorrow.                            Written discharge instructions were provided to the                            patient.                           - Resume previous diet.                           - Continue present medications.                           - Await pathology results.                           - Repeat colonoscopy in 3 years for surveillance.                           - Return to GI clinic as previously scheduled. Procedure Code(s):        --- Professional ---                           [id number], Colonoscopy, flexible; with endoscopic                            mucosal resection  16109, 59, Colonoscopy, flexible; with removal of                            tumor(s), polyp(s), or other lesion(s) by snare                            technique Diagnosis Code(s):        --- Professional ---                           D12.2, Benign neoplasm of ascending colon                           D12.4, Benign neoplasm of descending colon                           D12.5, Benign neoplasm of sigmoid colon                           D12.3, Benign neoplasm of transverse colon (hepatic                            flexure or splenic flexure)                           D12.0, Benign neoplasm of cecum                           K64.8, Other hemorrhoids                           K57.30, Diverticulosis of large intestine without                            perforation or abscess without bleeding                           R93.3, Abnormal findings on diagnostic imaging of                            other parts of digestive tract CPT copyright 2022 American Medical Association. All rights reserved. The codes documented in this report are preliminary and upon coder review may  be revised to meet current compliance requirements. Sanjuan Dame, MD Sanjuan Dame, MD 05/26/2023 9:00:08 AM This report has been signed electronically. Number of Addenda: 0

## 2023-05-26 NOTE — Discharge Instructions (Signed)
  Discharge instructions Please read the instructions outlined below and refer to this sheet in the next few weeks. These discharge instructions provide you with general information on caring for yourself after you leave the hospital. Your doctor may also give you specific instructions. While your treatment has been planned according to the most current medical practices available, unavoidable complications occasionally occur. If you have any problems or questions after discharge, please call your doctor. ACTIVITY You may resume your regular activity but move at a slower pace for the next 24 hours.  Take frequent rest periods for the next 24 hours.  Walking will help expel (get rid of) the air and reduce the bloated feeling in your abdomen.  No driving for 24 hours (because of the anesthesia (medicine) used during the test).  You may shower.  Do not sign any important legal documents or operate any machinery for 24 hours (because of the anesthesia used during the test).  NUTRITION Drink plenty of fluids.  You may resume your normal diet.  Begin with a light meal and progress to your normal diet.  Avoid alcoholic beverages for 24 hours or as instructed by your caregiver.  MEDICATIONS You may resume your normal medications unless your caregiver tells you otherwise.  WHAT YOU CAN EXPECT TODAY You may experience abdominal discomfort such as a feeling of fullness or "gas" pains.  FOLLOW-UP Your doctor will discuss the results of your test with you.  SEEK IMMEDIATE MEDICAL ATTENTION IF ANY OF THE FOLLOWING OCCUR: Excessive nausea (feeling sick to your stomach) and/or vomiting.  Severe abdominal pain and distention (swelling).  Trouble swallowing.  Temperature over 101 F (37.8 C).  Rectal bleeding or vomiting of blood.    Increase Carvedilol 3.125mg  twice daily , check BP and HR daily to titrate up -Protonix once daily for 8 weeks -Avoid NSAIDs -Soft diet for 2 days Avoid using high dose  aspirin including Goody/BC powders, NSAIDs such as Aleve, ibuprofen, naproxen, Motrin, Voltaren or Advil (even the topical ones)    I hope you have a great rest of your week!   Vista Lawman , M.D.. Gastroenterology and Hepatology [facility]

## 2023-05-26 NOTE — Anesthesia Procedure Notes (Signed)
 Date/Time: 05/26/2023 7:36 AM  Performed by: Franco Nones, CRNAPre-anesthesia Checklist: Patient identified, Emergency Drugs available, Suction available, Timeout performed and Patient being monitored Patient Re-evaluated:Patient Re-evaluated prior to induction Oxygen Delivery Method: Nasal Cannula

## 2023-05-26 NOTE — Anesthesia Preprocedure Evaluation (Signed)
 Anesthesia Evaluation  Patient identified by MRN, date of birth, ID band Patient awake    Reviewed: Allergy & Precautions, H&P , NPO status , Patient's Chart, lab work & pertinent test results, reviewed documented beta blocker date and time   Airway Mallampati: II  TM Distance: >3 FB Neck ROM: full    Dental no notable dental hx.    Pulmonary neg pulmonary ROS, former smoker   Pulmonary exam normal breath sounds clear to auscultation       Cardiovascular Exercise Tolerance: Good hypertension, negative cardio ROS + Valvular Problems/Murmurs  Rhythm:regular Rate:Normal     Neuro/Psych  PSYCHIATRIC DISORDERS Anxiety     negative neurological ROS  negative psych ROS   GI/Hepatic negative GI ROS, Neg liver ROS,,,  Endo/Other  negative endocrine ROSdiabetes    Renal/GU Renal diseasenegative Renal ROS  negative genitourinary   Musculoskeletal   Abdominal   Peds  Hematology negative hematology ROS (+) Blood dyscrasia, anemia   Anesthesia Other Findings   Reproductive/Obstetrics negative OB ROS                             Anesthesia Physical Anesthesia Plan  ASA: 3  Anesthesia Plan: General   Post-op Pain Management:    Induction:   PONV Risk Score and Plan: Propofol infusion  Airway Management Planned:   Additional Equipment:   Intra-op Plan:   Post-operative Plan:   Informed Consent: I have reviewed the patients History and Physical, chart, labs and discussed the procedure including the risks, benefits and alternatives for the proposed anesthesia with the patient or authorized representative who has indicated his/her understanding and acceptance.     Dental Advisory Given  Plan Discussed with: CRNA  Anesthesia Plan Comments:        Anesthesia Quick Evaluation

## 2023-05-29 ENCOUNTER — Encounter (HOSPITAL_COMMUNITY): Payer: Self-pay | Admitting: Gastroenterology

## 2023-05-29 LAB — SURGICAL PATHOLOGY

## 2023-05-30 NOTE — Progress Notes (Signed)
 I reviewed the pathology results. Ann, can you send her a letter with the findings as described below please?  Repeat colonoscopy in 3 years Repeat upper endoscopy 8 weeks  Thanks,  Daqwan Dougal Faizan Ziair Penson, MD Gastroenterology and Hepatology [facility]  ---------------------------------------------------------------------------------------------  [facility] [address], [place], Betty [place] [address] Phone:  [phone]   05/30/23 Betty Gonzalez, Betty Gonzalez   Dear Betty Gonzalez,  I am writing to inform you that the biopsies taken during your recent endoscopic examination showed:  A. STOMACH, BIOPSY:  - Mild reactive gastropathy.  - Negative for H. pylori on HE stain  - No intestinal metaplasia, dysplasia, or malignancy.   B.  COLON, CECAL/ASCENDING/TRANSVERSE/DESCENDING/SIGMOID, POLYPECTOMY:  - Tubular adenoma(s).  - No high grade dysplasia or malignancy.  - Sessile serrated polyp(s).  - No dysplasia or malignancy.    What does this mean?   You had a total of 3 polyps removed. The pathology came back as "tubular adenoma and sessile serrated polyp ." These findings are NOT cancer, but had the polyps remained in your colon, they could have turned into cancer.  Given these findings, it is recommended that your next colonoscopy be performed in 3 years as two of the polyps were large in size .  Take protonix , Carvedilol 3.125mg  twice daily ,Repeat upper endoscopy in 8 weeks to evaluate the response to therapy,  Also I value your feedback , so if you get a survey , please take the time to fill it out and thank you for choosing [facility]  Please call us  at [phone] if you have persistent problems or have questions about your condition that have not been fully answered at this time.  Sincerely,  Zade Falkner Faizan Kiahna Banghart,  MD Gastroenterology and Hepatology

## 2023-06-05 ENCOUNTER — Encounter (INDEPENDENT_AMBULATORY_CARE_PROVIDER_SITE_OTHER): Payer: Self-pay | Admitting: *Deleted

## 2023-06-07 NOTE — Anesthesia Postprocedure Evaluation (Signed)
 Anesthesia Post Note  Patient: Betty Gonzalez  Procedure(s) Performed: COLONOSCOPY EGD (ESOPHAGOGASTRODUODENOSCOPY)  Patient location during evaluation: Phase II Anesthesia Type: General Level of consciousness: awake Pain management: pain level controlled Vital Signs Assessment: post-procedure vital signs reviewed and stable Respiratory status: spontaneous breathing and respiratory function stable Cardiovascular status: blood pressure returned to baseline and stable Postop Assessment: no headache and no apparent nausea or vomiting Anesthetic complications: no Comments: Late entry   No notable events documented.   Last Vitals:  Vitals:   05/26/23 0653 05/26/23 0850  BP: (!) 123/56 (!) 106/93  Pulse: 61 73  Resp: 14 17  Temp: 36.6 C 36.4 C  SpO2: 97% 100%    Last Pain:  Vitals:   05/29/23 1554  TempSrc:   PainSc: 0-No pain                 Coretha Dew

## 2023-06-23 ENCOUNTER — Encounter (INDEPENDENT_AMBULATORY_CARE_PROVIDER_SITE_OTHER): Payer: Self-pay | Admitting: *Deleted

## 2023-06-23 NOTE — Telephone Encounter (Signed)
 LM with spouse to have patient call back to schedule

## 2023-06-26 NOTE — Telephone Encounter (Signed)
 Pt left voicemail returning call. Returned call to pt. Pt scheduled for 08/03/23. Instructions will be mailed to pt. No pa needed per insurance.

## 2023-07-25 NOTE — Telephone Encounter (Signed)
 Pt called in. Lost prep instructions and asked I send to her mychart. I have done so.

## 2023-08-01 ENCOUNTER — Other Ambulatory Visit: Payer: Self-pay

## 2023-08-01 ENCOUNTER — Encounter (HOSPITAL_COMMUNITY)
Admission: RE | Admit: 2023-08-01 | Discharge: 2023-08-01 | Disposition: A | Discharge status: Home / Self Care | Source: Ambulatory Visit | Attending: Gastroenterology | Admitting: Gastroenterology

## 2023-08-01 ENCOUNTER — Encounter (HOSPITAL_COMMUNITY): Payer: Self-pay

## 2023-08-03 ENCOUNTER — Ambulatory Visit (HOSPITAL_COMMUNITY): Admitting: Anesthesiology

## 2023-08-03 ENCOUNTER — Encounter (HOSPITAL_COMMUNITY)
Admission: RE | Disposition: A | Discharge status: Home / Self Care | Payer: Self-pay | Source: Home / Self Care | Attending: Gastroenterology

## 2023-08-03 ENCOUNTER — Other Ambulatory Visit: Payer: Self-pay

## 2023-08-03 ENCOUNTER — Encounter (HOSPITAL_COMMUNITY): Payer: Self-pay | Admitting: Gastroenterology

## 2023-08-03 ENCOUNTER — Ambulatory Visit (HOSPITAL_COMMUNITY)
Admission: RE | Admit: 2023-08-03 | Discharge: 2023-08-03 | Disposition: A | Discharge status: Home / Self Care | Attending: Gastroenterology | Admitting: Gastroenterology

## 2023-08-03 DIAGNOSIS — I8510 Secondary esophageal varices without bleeding: Secondary | ICD-10-CM

## 2023-08-03 DIAGNOSIS — Z09 Encounter for follow-up examination after completed treatment for conditions other than malignant neoplasm: Secondary | ICD-10-CM | POA: Diagnosis present

## 2023-08-03 DIAGNOSIS — E785 Hyperlipidemia, unspecified: Secondary | ICD-10-CM | POA: Diagnosis not present

## 2023-08-03 DIAGNOSIS — K3189 Other diseases of stomach and duodenum: Secondary | ICD-10-CM

## 2023-08-03 DIAGNOSIS — K766 Portal hypertension: Secondary | ICD-10-CM | POA: Diagnosis not present

## 2023-08-03 DIAGNOSIS — N189 Chronic kidney disease, unspecified: Secondary | ICD-10-CM | POA: Insufficient documentation

## 2023-08-03 DIAGNOSIS — Z87891 Personal history of nicotine dependence: Secondary | ICD-10-CM | POA: Diagnosis not present

## 2023-08-03 DIAGNOSIS — E1122 Type 2 diabetes mellitus with diabetic chronic kidney disease: Secondary | ICD-10-CM | POA: Diagnosis not present

## 2023-08-03 DIAGNOSIS — I8500 Esophageal varices without bleeding: Secondary | ICD-10-CM | POA: Diagnosis not present

## 2023-08-03 HISTORY — PX: ESOPHAGOGASTRODUODENOSCOPY: SHX5428

## 2023-08-03 LAB — GLUCOSE, CAPILLARY: Glucose-Capillary: 148 mg/dL — ABNORMAL HIGH (ref 70–99)

## 2023-08-03 MED ORDER — LIDOCAINE 2% (20 MG/ML) 5 ML SYRINGE
INTRAMUSCULAR | Status: DC | PRN
  Administered 2023-08-03: 100 mg via INTRAVENOUS

## 2023-08-03 MED ORDER — LACTATED RINGERS IV SOLN: INTRAVENOUS | Status: DC | PRN

## 2023-08-03 MED ORDER — CARVEDILOL 6.25 MG PO TABS: 6.2500 mg | ORAL_TABLET | Freq: Two times a day (BID) | ORAL | 3 refills | Status: AC

## 2023-08-03 MED ORDER — PHENYLEPHRINE 80 MCG/ML (10ML) SYRINGE FOR IV PUSH (FOR BLOOD PRESSURE SUPPORT)
PREFILLED_SYRINGE | INTRAVENOUS | Status: DC | PRN
  Administered 2023-08-03 (×3): 160 ug via INTRAVENOUS

## 2023-08-03 MED ORDER — PHENYLEPHRINE 80 MCG/ML (10ML) SYRINGE FOR IV PUSH (FOR BLOOD PRESSURE SUPPORT)
PREFILLED_SYRINGE | INTRAVENOUS | Status: AC
  Filled 2023-08-03: qty 10

## 2023-08-03 MED ORDER — PROPOFOL 10 MG/ML IV BOLUS
INTRAVENOUS | Status: DC | PRN
  Administered 2023-08-03: 100 mg via INTRAVENOUS

## 2023-08-03 MED ORDER — PROPOFOL 500 MG/50ML IV EMUL
INTRAVENOUS | Status: DC | PRN
  Administered 2023-08-03: 150 ug/kg/min via INTRAVENOUS

## 2023-08-03 NOTE — H&P (Signed)
 Primary Care Physician:  Bettie Bruce, PA-C Primary Gastroenterologist:  Dr. Alita Irwin  Pre-Procedure History & Physical: HPI:  Betty Gonzalez is a 73 y.o. female with past medical history of  diabetes, hyperlipidemia, history of Lyme disease , cirhhosis is here for esophageal varices surveillance  given history of large varices with bleed ( Last EGD 05/2023)   - Grade III esophageal varices. Completely eradicated. Banded. - Erythematous mucosa in the antrum. Biopsied. - Normal duodenal bulb and second portion of the duodenum.  Past Medical History:  Diagnosis Date   Anxiety    Chronic kidney disease    Diabetes mellitus without complication (HCC)    Heart murmur    Hyperlipidemia    Lyme disease     Past Surgical History:  Procedure Laterality Date   CATARACT EXTRACTION Bilateral    COLONOSCOPY N/A 05/26/2023   Procedure: COLONOSCOPY;  Surgeon: Hargis Lias, MD;  [facility];  Service: Endoscopy;  Laterality: N/A;  7:30AM;ASA 3   DILATION AND CURETTAGE OF UTERUS     ESL Lithotripsy     ESOPHAGEAL BANDING  04/01/2023   Procedure: ESOPHAGEAL BANDING;  Surgeon: Hargis Lias, MD;  Location: [facility];  Service: Endoscopy;;   ESOPHAGOGASTRODUODENOSCOPY N/A 05/26/2023   Procedure: EGD (ESOPHAGOGASTRODUODENOSCOPY);  Surgeon: Hargis Lias, MD;  Location: [facility];  Service: Endoscopy;  Laterality: N/A;  7:30AM;ASA 3   ESOPHAGOGASTRODUODENOSCOPY (EGD) WITH PROPOFOL  N/A 04/01/2023   Procedure: ESOPHAGOGASTRODUODENOSCOPY (EGD) WITH PROPOFOL ;  Surgeon: Hargis Lias, MD;  Location: [facility];  Service: Endoscopy;  Laterality: N/A;   NASAL SEPTUM SURGERY     TONSILLECTOMY      Prior to Admission medications   Medication Sig Start Date End Date Taking? Authorizing Provider  carvedilol  (COREG ) 3.125 MG tablet Take 1 tablet (3.125 mg total) by mouth 2 (two) times daily with a meal. Take 1 tablet daily for 1 week and monitor BP at home, if tolerated  increase to 1 tablet twice daily 05/26/23  Yes Tyarra Nolton F, MD  cyanocobalamin  (VITAMIN B12) 500 MCG tablet Take 500 mcg by mouth daily.   Yes [provider]  escitalopram (LEXAPRO) 5 MG tablet Take 2.5 mg by mouth daily.   Yes [provider]  LORazepam  (ATIVAN ) 1 MG tablet Take 1 tablet (1 mg total) by mouth daily as needed for anxiety. No refills until 09/29/2017 09/25/17  Yes Bettie Bruce, PA-C  Multiple Vitamin (MULTIVITAMIN WITH MINERALS) TABS tablet Take 1 tablet by mouth daily.   Yes [provider]  pantoprazole  (PROTONIX ) 40 MG tablet Take 1 tablet (40 mg total) by mouth daily. 05/26/23 08/24/23 Yes Stephenie Navejas F, MD  cetirizine (ZYRTEC) 10 MG chewable tablet Chew 10 mg by mouth daily.    [provider]  clindamycin (CLEOCIN) 150 MG capsule Take 150 mg by mouth in the morning and at bedtime.    [provider]  empagliflozin  (JARDIANCE ) 10 MG TABS tablet Take 10 mg by mouth daily.    [provider]  fluticasone  (FLONASE ) 50 MCG/ACT nasal spray Place 2 sprays into both nostrils daily. 09/25/17   Bettie Bruce, PA-C  niacin 500 MG CR capsule Take 500 mg by mouth at bedtime.    [provider]  Zinc Sulfate (ZINC-220 PO) Take 1 tablet by mouth daily.    [provider]    Allergies as of 06/26/2023 - Review Complete 05/26/2023  Allergen Reaction Noted   Statins Rash 05/25/2018  Codeine  01/03/2008   Sulfonamide derivatives     Other Rash and Other (See Comments) 06/26/2013   Pedi-pre tape spray [wound dressing adhesive] Other (See Comments) 06/26/2013   Penicillins Rash    Silicone Rash 06/26/2013   Tape Rash and Other (See Comments) 06/26/2013    Family History  Problem Relation Age of Onset   Dementia Mother    Healthy Father     Social History   Socioeconomic History   Marital status: Single    Spouse name: Not on file   Number of children: Not on file   Years of education: Not on  file   Highest education level: Not on file  Occupational History   Not on file  Tobacco Use   Smoking status: Former    Types: Cigars, Cigarettes   Smokeless tobacco: Never   Tobacco comments:    1 cigar occasion  Vaping Use   Vaping status: Never Used  Substance and Sexual Activity   Alcohol use: Yes    Comment: occasionally   Drug use: Yes    Types: Marijuana    Comment: rare   Sexual activity: Not Currently  Other Topics Concern   Not on file  Social History Narrative   Not on file   Social Drivers of Health   Financial Resource Strain: Low Risk  (02/05/2022)   Received from [facility]   Overall Financial Resource Strain (CARDIA)    Difficulty of Paying Living Expenses: Not hard at all  Food Insecurity: No Food Insecurity (04/01/2023)   Hunger Vital Sign    Worried About Running Out of Food in the Last Year: Never true    Ran Out of Food in the Last Year: Never true  Transportation Needs: No Transportation Needs (04/01/2023)   PRAPARE - Administrator, Civil Service (Medical): No    Lack of Transportation (Non-Medical): No  Physical Activity: Not on file  Stress: Not on file  Social Connections: Moderately Isolated (04/01/2023)   Social Connection and Isolation Panel    Frequency of Communication with Friends and Family: More than three times a week    Frequency of Social Gatherings with Friends and Family: More than three times a week    Attends Religious Services: 1 to 4 times per year    Active Member of [employer] or Organizations: No    Attends Banker Meetings: Never    Marital Status: Never married  Intimate Partner Violence: Not At Risk (04/01/2023)   Humiliation, Afraid, Rape, and Kick questionnaire    Fear of Current or Ex-Partner: No    Emotionally Abused: No    Physically Abused: No    Sexually Abused: No    Review of Systems: See HPI, otherwise negative ROS  Physical Exam: Vital signs in last 24  hours: Temp:  [98 F (36.7 C)] 98 F (36.7 C) (06/19 0748) Pulse Rate:  [63] 63 (06/19 0748) Resp:  [15] 15 (06/19 0748) BP: (138)/(65) 138/65 (06/19 0748) SpO2:  [98 %] 98 % (06/19 0748) Weight:  [74.4 kg] 74.4 kg (06/19 0748)   General:   Alert,  Well-developed, well-nourished, pleasant and cooperative in NAD Head:  Normocephalic and atraumatic. Eyes:  Sclera clear, no icterus.   Conjunctiva pink. Ears:  Normal auditory acuity. Nose:  No deformity, discharge,  or lesions. Msk:  Symmetrical without gross deformities. Normal posture. Extremities:  Without clubbing or edema. Neurologic:  Alert and  oriented x4;  grossly normal neurologically. Skin:  Intact without significant lesions or rashes. Psych:  Alert and cooperative. Normal mood and affect.  Impression/Plan: Betty Gonzalez is a 73 y.o. female with past medical history of  diabetes, hyperlipidemia, history of Lyme disease , cirhhosis is here for esophageal varices surveillance  given history of large varices with bleed ( Last EGD 05/2023) . Proceed with EGD +/- banding   The risks of the procedure including infection, bleed, or perforation as well as benefits, limitations, alternatives and imponderables have been reviewed with the patient. Questions have been answered. All parties agreeable.

## 2023-08-03 NOTE — Anesthesia Preprocedure Evaluation (Signed)
 Anesthesia Evaluation  Patient identified by MRN, date of birth, ID band Patient awake    Reviewed: Allergy & Precautions, H&P , NPO status , Patient's Chart, lab work & pertinent test results, reviewed documented beta blocker date and time   Airway Mallampati: II  TM Distance: >3 FB Neck ROM: full    Dental no notable dental hx.    Pulmonary neg pulmonary ROS, former smoker   Pulmonary exam normal breath sounds clear to auscultation       Cardiovascular Exercise Tolerance: Good hypertension, negative cardio ROS + Valvular Problems/Murmurs  Rhythm:regular Rate:Normal     Neuro/Psych  PSYCHIATRIC DISORDERS Anxiety     negative neurological ROS  negative psych ROS   GI/Hepatic negative GI ROS,,,(+) Cirrhosis   Esophageal Varices      Endo/Other  negative endocrine ROSdiabetes, Type 2    Renal/GU CRFRenal diseasenegative Renal ROS  negative genitourinary   Musculoskeletal   Abdominal   Peds  Hematology negative hematology ROS (+) Blood dyscrasia, anemia   Anesthesia Other Findings   Reproductive/Obstetrics negative OB ROS                             Anesthesia Physical Anesthesia Plan  ASA: 3  Anesthesia Plan: General   Post-op Pain Management:    Induction:   PONV Risk Score and Plan: Propofol  infusion  Airway Management Planned:   Additional Equipment:   Intra-op Plan:   Post-operative Plan:   Informed Consent: I have reviewed the patients History and Physical, chart, labs and discussed the procedure including the risks, benefits and alternatives for the proposed anesthesia with the patient or authorized representative who has indicated his/her understanding and acceptance.     Dental Advisory Given  Plan Discussed with: CRNA  Anesthesia Plan Comments:         Anesthesia Quick Evaluation

## 2023-08-03 NOTE — Op Note (Signed)
 [facility] Patient Name: Betty Gonzalez Procedure Date: 08/03/2023 8:57 AM MRN: [id number] Date of Birth: [birth year] Attending MD: Terril Fetters , MD, [id number] CSN: [id number] Age: 73 Admit Type: Outpatient Procedure:                Upper GI endoscopy Indications:              Follow-up of esophageal varices, For therapy of                            esophageal varices, history of EV bleed , secondary                            prevention EGD Providers:                Terril Fetters, MD, Willena Harp, Theola Fitch Referring MD:              Medicines:                Monitored Anesthesia Care Complications:            No immediate complications. Estimated Blood Loss:     Estimated blood loss: none. Procedure:                Pre-Anesthesia Assessment:                           - Prior to the procedure, a History and Physical                            was performed, and patient medications and                            allergies were reviewed. The patient's tolerance of                            previous anesthesia was also reviewed. The risks                            and benefits of the procedure and the sedation                            options and risks were discussed with the patient.                            All questions were answered, and informed consent                            was obtained. Prior Anticoagulants: The patient has                            taken no anticoagulant or antiplatelet agents. ASA                            Grade Assessment: III - A patient with severe  systemic disease. After reviewing the risks and                            benefits, the patient was deemed in satisfactory                            condition to undergo the procedure.                           After obtaining informed consent, the endoscope was                            passed under direct vision. Throughout the                             procedure, the patient's blood pressure, pulse, and                            oxygen saturations were monitored continuously. The                            [id number]-H190 ([id number]) scope was introduced through the                            mouth, and advanced to the second part of duodenum.                            The upper GI endoscopy was accomplished without                            difficulty. The patient tolerated the procedure                            well. Scope In: 9:10:33 AM Scope Out: 9:15:54 AM Total Procedure Duration: 0 hours 5 minutes 21 seconds  Findings:      Grade I varices were found in the lower third of the esophagus.      Mild portal hypertensive gastropathy was found in the stomach.      The duodenal bulb and second portion of the duodenum were normal. Impression:               - Grade I esophageal varices.                           - Portal hypertensive gastropathy.                           - Normal duodenal bulb and second portion of the                            duodenum.                           - No specimens collected. Moderate Sedation:      Per Anesthesia Care Recommendation:           -  Patient has a contact number available for                            emergencies. The signs and symptoms of potential                            delayed complications were discussed with the                            patient. Return to normal activities tomorrow.                            Written discharge instructions were provided to the                            patient.                           - Resume previous diet.                           - Continue present medications.                           - Repeat upper endoscopy in 6 months for                            surveillance.                           - Return to GI office as previously scheduled.                            -Uptitrate carvediolol to 6.125 mg twice daily ,and                             check BP/HR daily . Goal keep SBP>3mmHg and HR:                            55-60 bpm                           -Continue to follow up in GI clinic for management                            of cirrhosis Procedure Code(s):        --- Professional ---                           [phone], Esophagogastroduodenoscopy, flexible,                            transoral; diagnostic, including collection of                            specimen(s) by brushing or washing, when performed                            (  separate procedure) Diagnosis Code(s):        --- Professional ---                           I85.00, Esophageal varices without bleeding                           K76.6, Portal hypertension                           K31.89, Other diseases of stomach and duodenum CPT copyright 2022 American Medical Association. All rights reserved. The codes documented in this report are preliminary and upon coder review may  be revised to meet current compliance requirements. Terril Fetters, MD Terril Fetters, MD 08/03/2023 9:25:56 AM This report has been signed electronically. Number of Addenda: 0

## 2023-08-03 NOTE — Anesthesia Procedure Notes (Signed)
 Date/Time: 08/03/2023 9:03 AM  Performed by: Sherwin Donate, CRNAPre-anesthesia Checklist: Patient identified, Emergency Drugs available, Suction available and Patient being monitored Patient Re-evaluated:Patient Re-evaluated prior to induction Oxygen Delivery Method: Nasal cannula Induction Type: IV induction Placement Confirmation: positive ETCO2 Comments: Optiflow High Flow [place] O2 used.

## 2023-08-03 NOTE — Transfer of Care (Signed)
 Immediate Anesthesia Transfer of Care Note  Patient: Betty Gonzalez  Procedure(s) Performed: EGD (ESOPHAGOGASTRODUODENOSCOPY)  Patient Location: Short Stay  Anesthesia Type:General  Level of Consciousness: awake  Airway & Oxygen Therapy: Patient Spontanous Breathing  Post-op Assessment: Report given to RN and Post -op Vital signs reviewed and stable  Post vital signs: Reviewed and stable  Last Vitals:  Vitals Value Taken Time  BP    Temp    Pulse    Resp    SpO2      Last Pain:  Vitals:   08/03/23 0906  TempSrc:   PainSc: 0-No pain      Patients Stated Pain Goal: 8 (08/03/23 0748)  Complications: No notable events documented.

## 2023-08-03 NOTE — Discharge Instructions (Signed)
  Discharge instructions Please read the instructions outlined below and refer to this sheet in the next few weeks. These discharge instructions provide you with general information on caring for yourself after you leave the hospital. Your doctor may also give you specific instructions. While your treatment has been planned according to the most current medical practices available, unavoidable complications occasionally occur. If you have any problems or questions after discharge, please call your doctor. ACTIVITY You may resume your regular activity but move at a slower pace for the next 24 hours.  Take frequent rest periods for the next 24 hours.  Walking will help expel (get rid of) the air and reduce the bloated feeling in your abdomen.  No driving for 24 hours (because of the anesthesia (medicine) used during the test).  You may shower.  Do not sign any important legal documents or operate any machinery for 24 hours (because of the anesthesia used during the test).  NUTRITION Drink plenty of fluids.  You may resume your normal diet.  Begin with a light meal and progress to your normal diet.  Avoid alcoholic beverages for 24 hours or as instructed by your caregiver.  MEDICATIONS You may resume your normal medications unless your caregiver tells you otherwise.  WHAT YOU CAN EXPECT TODAY You may experience abdominal discomfort such as a feeling of fullness or "gas" pains.  FOLLOW-UP Your doctor will discuss the results of your test with you.  SEEK IMMEDIATE MEDICAL ATTENTION IF ANY OF THE FOLLOWING OCCUR: Excessive nausea (feeling sick to your stomach) and/or vomiting.  Severe abdominal pain and distention (swelling).  Trouble swallowing.  Temperature over 101 F (37.8 C).  Rectal bleeding or vomiting of blood.    Increase Carvedilol  dose to 6.125 mg twice daily and check BP and HR daily   Goal is to keep Systolic BP more than and HR : 55-60 bpm    I hope you have a great  rest of your week!   Letrell Attwood Faizan Romone Shaff , M.D.. Gastroenterology and Hepatology [facility]

## 2023-08-04 ENCOUNTER — Encounter (HOSPITAL_COMMUNITY): Payer: Self-pay | Admitting: Gastroenterology

## 2023-08-05 NOTE — Anesthesia Postprocedure Evaluation (Signed)
 Anesthesia Post Note  Patient: Betty Gonzalez  Procedure(s) Performed: EGD (ESOPHAGOGASTRODUODENOSCOPY)  Patient location during evaluation: Phase II Anesthesia Type: General Level of consciousness: awake Pain management: pain level controlled Vital Signs Assessment: post-procedure vital signs reviewed and stable Respiratory status: spontaneous breathing and respiratory function stable Cardiovascular status: blood pressure returned to baseline and stable Postop Assessment: no headache and no apparent nausea or vomiting Anesthetic complications: no Comments: Late entry   No notable events documented.   Last Vitals:  Vitals:   08/03/23 0748 08/03/23 0920  BP: 138/65 (!) 128/52  Pulse: 63 (!) 59  Resp: 15 16  Temp: 36.7 C (!) 36.3 C  SpO2: 98% 100%    Last Pain:  Vitals:   08/03/23 0920  TempSrc: Oral  PainSc: 0-No pain                 [name]

## 2023-08-28 ENCOUNTER — Other Ambulatory Visit (INDEPENDENT_AMBULATORY_CARE_PROVIDER_SITE_OTHER): Payer: Self-pay | Admitting: Gastroenterology

## 2023-10-04 ENCOUNTER — Other Ambulatory Visit (INDEPENDENT_AMBULATORY_CARE_PROVIDER_SITE_OTHER): Payer: Self-pay | Admitting: Gastroenterology

## 2023-10-10 ENCOUNTER — Other Ambulatory Visit: Payer: Self-pay | Admitting: Gastroenterology

## 2023-10-10 DIAGNOSIS — K7469 Other cirrhosis of liver: Secondary | ICD-10-CM

## 2023-10-11 ENCOUNTER — Ambulatory Visit (INDEPENDENT_AMBULATORY_CARE_PROVIDER_SITE_OTHER): Admitting: Gastroenterology

## 2023-10-11 ENCOUNTER — Encounter (INDEPENDENT_AMBULATORY_CARE_PROVIDER_SITE_OTHER): Payer: Self-pay | Admitting: Gastroenterology

## 2023-10-11 VITALS — BP 119/68 | HR 69 | Temp 97.6°F | Ht 68.0 in | Wt 167.9 lb

## 2023-10-11 DIAGNOSIS — K7469 Other cirrhosis of liver: Secondary | ICD-10-CM | POA: Diagnosis not present

## 2023-10-11 DIAGNOSIS — Z8719 Personal history of other diseases of the digestive system: Secondary | ICD-10-CM | POA: Diagnosis not present

## 2023-10-11 NOTE — Patient Instructions (Signed)
 It was very nice to meet you today, as dicussed with will plan for the following :  1) labs in 4 months 2) upper endoscopy in 5 months 3) ultrasound

## 2023-10-11 NOTE — Progress Notes (Signed)
 [place] Faizan Javonni Macke , M.D. Gastroenterology & Hepatology [facility] Gastroenterology [address] [place], [place] [address] Primary Care Physician: Gretta Ozell CROME, PA-C [address] [place] [address]  Chief Complaint: Cirrhosis  History of Present Illness: Betty Gonzalez is a 73 y.o. 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  She is scheduled for ultrasound followed by MRI in few months.  Patient reports that after increasing her carvedilol  her blood pressure was low and she felt weak.  Although she is currently tolerating her current dose of 3.125 twice daily and showed me her BP diary.  Patient has been trying to watch what ever she eats. The patient denies having any nausea, vomiting, fever, chills, hematochezia, melena, hematemesis, abdominal distention, abdominal pain, diarrhea, jaundice, pruritus or weight loss.  Patient had variceal bleed 08/22/2023 with grade 3 varices which were banded and for surveillance demonstrated grade 1 varices  Last EGD:07/2023  - Grade I esophageal varices. - Portal hypertensive gastropathy. - Normal duodenal bulb and second portion of the duodenum. - No specimens collected.  - Repeat upper endoscopy in 6 months for surveillance. - Uptitrate carvediolol to 6. 125 mg twice daily , and check BP/ HR daily . Goal keep SBP> and HR: 55- 60 bpm   Last Colonoscopy:05/2023  - One 15 mm polyp in the ascending colon, removed with mucosal resection. Resected and retrieved. - One 20 mm polyp in the descending colon, removed with mucosal resection. Resected and retrieved. - Five 5 to 8 mm polyps in the sigmoid colon, in the transverse colon and in the cecum, removed with a cold snare. Resected and  retrieved. - Diverticulosis in the entire examined colon. - Non- bleeding internal hemorrhoids. - Mucosal resection was performed. Resection and retrieval were complete. - Mucosal resection was performed. Resection and retrieval were complete.  A. STOMACH, BIOPSY:  - Mild reactive gastropathy.  - Negative for H. pylori on HE stain  - No intestinal metaplasia, dysplasia, or malignancy.    B.  COLON, CECAL/ASCENDING/TRANSVERSE/DESCENDING/SIGMOID, POLYPECTOMY:  - Tubular adenoma(s).  - No high grade dysplasia or malignancy.  - Sessile serrated polyp(s).  - No dysplasia or malignancy.    Past Medical History: Past Medical History:  Diagnosis Date   Anxiety    Chronic kidney disease    Diabetes mellitus without complication (HCC)    Heart murmur    Hyperlipidemia    Lyme disease     Past Surgical History: Past Surgical History:  Procedure Laterality Date   CATARACT EXTRACTION Bilateral    COLONOSCOPY N/A 05/26/2023   Procedure: COLONOSCOPY;  Surgeon: Cinderella Deatrice FALCON, MD;  [facility];  Service: Endoscopy;  Laterality: N/A;  7:30AM;ASA 3   DILATION AND CURETTAGE OF UTERUS     ESL Lithotripsy     ESOPHAGEAL BANDING  04/01/2023   Procedure: ESOPHAGEAL BANDING;  Surgeon: Cinderella Deatrice FALCON, MD;  [facility];  Service: Endoscopy;;   ESOPHAGOGASTRODUODENOSCOPY N/A 05/26/2023   Procedure: EGD (ESOPHAGOGASTRODUODENOSCOPY);  Surgeon: Cinderella Deatrice FALCON, MD;  [facility];  Service: Endoscopy;  Laterality: N/A;  7:30AM;ASA 3   ESOPHAGOGASTRODUODENOSCOPY N/A 08/03/2023   Procedure: EGD (ESOPHAGOGASTRODUODENOSCOPY);  Surgeon: Cinderella Deatrice FALCON, MD;  [facility];  Service: Endoscopy;  Laterality: N/A;  8:45AM;ASA  3   ESOPHAGOGASTRODUODENOSCOPY (EGD) WITH PROPOFOL  N/A 04/01/2023   Procedure: ESOPHAGOGASTRODUODENOSCOPY (EGD) WITH PROPOFOL ;  Surgeon: Cinderella Deatrice FALCON, MD;  [facility];  Service: Endoscopy;  Laterality: N/A;   NASAL SEPTUM  SURGERY     TONSILLECTOMY      Family History: Family History  Problem Relation Age of Onset   Dementia Mother    Healthy Father     Social History: Social History   Tobacco Use  Smoking Status Former   Types: Cigars, Cigarettes  Smokeless Tobacco Never  Tobacco Comments   1 cigar occasion   Social History   Substance and Sexual Activity  Alcohol Use Yes   Comment: occasionally   Social History   Substance and Sexual Activity  Drug Use Yes   Types: Marijuana   Comment: rare    Allergies: Allergies  Allergen Reactions   Statins Rash   Codeine    Sulfonamide Derivatives    Other Rash and Other (See Comments)    Thermasol - active ingredient in the flu shot -- made eyes swell up  Statins - affects muscles in legs  Adhesive Tape;  Blisters   Adhesive Tape;  Blisters   Adhesive Tape;  Blisters   Pedi-Pre Tape Spray [Wound Dressing Adhesive] Other (See Comments)    Adhesive Tape;  Blisters    Penicillins Rash   Silicone Rash    Blisters  Adhesive Tape;  Blisters   Adhesive Tape;  Blisters   Blisters  Adhesive Tape;  Blisters   Blisters  Adhesive Tape;  Blisters   Blisters, Adhesive Tape;  Blisters   Blisters Adhesive Tape;  Blisters     Blisters Adhesive Tape;  Blisters  Adhesive Tape;  Blisters  Blisters Adhesive Tape;  Blisters  Blisters Adhesive Tape;  Blisters     Blisters, Adhesive Tape;  Blisters   Tape Rash and Other (See Comments)    Adhesive Tape;  Blisters  Blisters Adhesive Tape;  Blisters     Medications: Current Outpatient Medications  Medication Sig Dispense Refill   carvedilol  (COREG ) 6.25 MG tablet Take 1 tablet (6.25 mg total) by mouth 2 (two) times daily with a meal. Take 1 tablet daily for 1 week and monitor BP at home, if tolerated increase to 1 tablet twice daily 60 tablet 3   cetirizine (ZYRTEC) 10 MG chewable tablet Chew 10 mg by mouth daily.     clindamycin (CLEOCIN) 150 MG capsule Take 150 mg by mouth in the morning  and at bedtime.     cyanocobalamin  (VITAMIN B12) 500 MCG tablet Take 500 mcg by mouth daily.     empagliflozin  (JARDIANCE ) 10 MG TABS tablet Take 10 mg by mouth daily.     escitalopram (LEXAPRO) 5 MG tablet Take 2.5 mg by mouth daily.     fluticasone  (FLONASE ) 50 MCG/ACT nasal spray Place 2 sprays into both nostrils daily. 16 g 6   LORazepam  (ATIVAN ) 1 MG tablet Take 1 tablet (1 mg total) by mouth daily as needed for anxiety. No refills until 09/29/2017 90 tablet 1   Multiple Vitamin (MULTIVITAMIN WITH MINERALS) TABS tablet Take 1 tablet by mouth daily.     pantoprazole  (PROTONIX ) 40 MG tablet TAKE 1 TABLET(40 MG) BY MOUTH DAILY 90 tablet 3   No current facility-administered medications for this visit.    Review of Systems: GENERAL: negative for malaise, night sweats HEENT: No changes in hearing or vision, no nose bleeds or other nasal problems. NECK: Negative for lumps, goiter, pain  and significant neck swelling RESPIRATORY: Negative for cough, wheezing CARDIOVASCULAR: Negative for chest pain, leg swelling, palpitations, orthopnea GI: SEE HPI MUSCULOSKELETAL: Negative for joint pain or swelling, back pain, and muscle pain. SKIN: Negative for lesions, rash HEMATOLOGY Negative for prolonged bleeding, bruising easily, and swollen nodes. ENDOCRINE: Negative for cold or heat intolerance, polyuria, polydipsia and goiter. NEURO: negative for tremor, gait imbalance, syncope and seizures. The remainder of the review of systems is noncontributory.   Physical Exam: BP 119/68   Pulse 69   Temp 97.6 F (36.4 C)   Ht 5' 8 (1.727 m)   Wt 167 lb 14.4 oz (76.2 kg)   BMI 25.53 kg/m  GENERAL: The patient is AO x3, in no acute distress. HEENT: Head is normocephalic and atraumatic. EOMI are intact. Mouth is well hydrated and without lesions. NECK: Supple. No masses LUNGS: Clear to auscultation. No presence of rhonchi/wheezing/rales. Adequate chest expansion HEART: RRR, normal s1 and s2. ABDOMEN:  Soft, nontender, no guarding, no peritoneal signs, and nondistended. BS +. No masses.  Imaging/Labs: as above     Latest Ref Rng & Units 05/24/2023   10:35 AM 04/06/2023    2:40 PM 04/04/2023    3:40 AM  CBC  WBC 4.0 - 10.5 K/uL 5.2  6.8  5.5   Hemoglobin 12.0 - 15.0 g/dL 89.0  9.4  8.0   Hematocrit 36.0 - 46.0 % 34.0  28.0  25.0   Platelets 150 - 400 K/uL 146  199  133    Lab Results  Component Value Date   IRON 37 04/03/2023   TIBC 294 04/03/2023   FERRITIN 80 04/03/2023    I personally reviewed and interpreted the available labs, imaging and endoscopic files.  Impression and Plan:  Betty Gonzalez is a 73 y.o. 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 (03/2023)   #Cirrhosis  MELD 3.0: 9 at 05/24/2023 10:35 AM MELD-Na: 7 at 05/24/2023 10:35 AM Calculated from: Serum Creatinine: 0.8 mg/dL (Using min of 1 mg/dL) at 5/0/7974 89:64 AM Serum Sodium: 136 mmol/L at 05/24/2023 10:35 AM Total Bilirubin: 0.8 mg/dL (Using min of 1 mg/dL) at 5/0/7974 89:64 AM Serum Albumin : 3.4 g/dL at 5/0/7974 89:64 AM INR(ratio): 1.1 at 05/24/2023 10:35 AM Age at listing (hypothetical): 72 years Sex: Female at 05/24/2023 10:35 AM   #Eitology :   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  Patient is on carvedilol  3.125 twice daily unable to tolerate up titration  # HCC screening - Last abdominal imaging  03/2023 , due now  - Last AFP:2.8  Recommendation   - Check CBC, MELD labs and AFP in 4 months - Schedule liver US  , already scheduled with [facility] and agree with MRI baseline next time  - Can take Tylenol  max of 2 g per day (650 mg q8h) for pain - Avoid NSAIDs for pain - Avoid eating raw oysters/shellfish - Protein shake (Ensure or  Boost) every night before going to sleep -C/w Carvediolol 3.125 mg BID  -HBV/HAV immunization with PCP  -Labs in 4 months -EGD for EVL ( secondary prophylaxis)  as patient unable to tolerate NSBB  Appreciate transplant hepatology evaluation .  Patient is up to date to colonoscopy and next due in 3 years   All questions were answered.      Naketa Daddario Faizan Riyan Gavina, MD Gastroenterology and Hepatology [facility]   This chart has been completed using [facility] Dictation software, and while attempts have been made to ensure accuracy , certain words and phrases may not be transcribed as intended

## 2023-10-13 ENCOUNTER — Ambulatory Visit
Admission: RE | Admit: 2023-10-13 | Discharge: 2023-10-13 | Disposition: A | Discharge status: Home / Self Care | Source: Ambulatory Visit | Attending: Gastroenterology | Admitting: Gastroenterology

## 2023-10-13 DIAGNOSIS — K7469 Other cirrhosis of liver: Secondary | ICD-10-CM

## 2023-12-29 ENCOUNTER — Ambulatory Visit (INDEPENDENT_AMBULATORY_CARE_PROVIDER_SITE_OTHER)

## 2023-12-29 ENCOUNTER — Ambulatory Visit
Admission: EM | Admit: 2023-12-29 | Discharge: 2023-12-29 | Disposition: A | Discharge status: Home / Self Care | Attending: Family Medicine | Admitting: Family Medicine

## 2023-12-29 ENCOUNTER — Other Ambulatory Visit

## 2023-12-29 DIAGNOSIS — J208 Acute bronchitis due to other specified organisms: Secondary | ICD-10-CM | POA: Diagnosis not present

## 2023-12-29 DIAGNOSIS — R051 Acute cough: Secondary | ICD-10-CM

## 2023-12-29 MED ORDER — PROMETHAZINE-DM 6.25-15 MG/5ML PO SYRP: 5.0000 mL | ORAL_SOLUTION | Freq: Four times a day (QID) | ORAL | 0 refills | Status: AC | PRN

## 2023-12-29 MED ORDER — DEXAMETHASONE SOD PHOSPHATE PF 10 MG/ML IJ SOLN
10.0000 mg | Freq: Once | INTRAMUSCULAR | Status: AC
  Administered 2023-12-29: 10 mg via INTRAMUSCULAR

## 2023-12-29 NOTE — Discharge Instructions (Addendum)
 In addition to the prescribed medications/steroid injection given, I recommend continuing the Astelin  twice daily, antihistamines, trying Coricidin HBP as needed, saline sinus rinses several times daily as needed, humidifiers, drinking plenty of fluids.  Follow-up for worsening or unresolving symptoms.

## 2023-12-29 NOTE — ED Triage Notes (Signed)
 Pt states cough and congestion for the past 5 days. States she feels a pressure in her forehead. States she has been taking Mucinex at home.

## 2023-12-31 NOTE — ED Provider Notes (Signed)
[facility]    CSN: [id number] Arrival date & time: 12/29/23  0810      History   Chief Complaint Chief Complaint  Patient presents with   Cough    HPI Betty Gonzalez is a 73 y.o. female.   Patient presenting today with 5-day history of congestion, cough, and now sinus pressure and worsening nasal congestion.  Denies fever, chills, chest pain, shortness of breath, vomiting, diarrhea.  So far try Mucinex with minimal relief.  No known history of chronic pulmonary disease.    Past Medical History:  Diagnosis Date   Anxiety    Chronic kidney disease    Diabetes mellitus without complication (HCC)    Heart murmur    Hyperlipidemia    Lyme disease     Patient Active Problem List   Diagnosis Date Noted   Secondary esophageal varices without bleeding (HCC) 05/26/2023   Gastritis and gastroduodenitis 05/26/2023   Adenomatous polyp of ascending colon 05/26/2023   Adenomatous polyp of descending colon 05/26/2023   History of esophageal varices with bleeding 04/20/2023   Decompensated cirrhosis (HCC) 04/04/2023   Other cirrhosis of liver (HCC) 04/03/2023   Upper GI bleed 04/01/2023   Vomiting blood 04/01/2023   Acute blood loss anemia 04/01/2023   Bleeding esophageal varices (HCC) 04/01/2023   Acute upper GI bleed 04/01/2023   Thrombocytopenia 04/01/2023   Carcinoma in situ of uterine cervix 06/04/2020   History of renal calculi 12/13/2017   Tick-borne disease 09/18/2017   Statin intolerance 02/03/2017   Well controlled diabetes mellitus (HCC) 02/01/2016   Actinic keratoses 06/26/2013   Acute sinusitis 06/26/2013   Adjustment disorder with anxiety 06/26/2013   Allergic rhinitis 06/26/2013   Dry mouth 06/26/2013   Fatigue 06/26/2013   Hyperglycemia 06/26/2013   Hyperlipemia 06/26/2013   Incomplete emptying of bladder 06/26/2013   Kidney stone 06/26/2013   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   Osteoporosis 06/26/2013   Type 2 diabetes mellitus  (HCC) 06/26/2013   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    Past Surgical History:  Procedure Laterality Date   CATARACT EXTRACTION Bilateral    COLONOSCOPY N/A 05/26/2023   Procedure: COLONOSCOPY;  Surgeon: Cinderella Deatrice FALCON, MD;  Location: [facility];  Service: Endoscopy;  Laterality: N/A;  7:30AM;ASA 3   DILATION AND CURETTAGE OF UTERUS     ESL Lithotripsy     ESOPHAGEAL BANDING  04/01/2023   Procedure: ESOPHAGEAL BANDING;  Surgeon: Cinderella Deatrice FALCON, MD;  Location: [facility];  Service: Endoscopy;;   ESOPHAGOGASTRODUODENOSCOPY N/A 05/26/2023   Procedure: EGD (ESOPHAGOGASTRODUODENOSCOPY);  Surgeon: Cinderella Deatrice FALCON, MD;  Location: [facility];  Service: Endoscopy;  Laterality: N/A;  7:30AM;ASA 3   ESOPHAGOGASTRODUODENOSCOPY N/A 08/03/2023   Procedure: EGD (ESOPHAGOGASTRODUODENOSCOPY);  Surgeon: Cinderella Deatrice FALCON, MD;  Location: [facility];  Service: Endoscopy;  Laterality: N/A;  8:45AM;ASA 3   ESOPHAGOGASTRODUODENOSCOPY (EGD) WITH PROPOFOL  N/A 04/01/2023   Procedure: ESOPHAGOGASTRODUODENOSCOPY (EGD) WITH PROPOFOL ;  Surgeon: Cinderella Deatrice FALCON, MD;  Location: [facility];  Service: Endoscopy;  Laterality: N/A;   NASAL SEPTUM SURGERY     TONSILLECTOMY      OB History   No obstetric history on file.      Home Medications    Prior to Admission medications   Medication Sig Start Date End Date Taking? Authorizing Provider  promethazine-dextromethorphan (PROMETHAZINE-DM) 6.25-15 MG/5ML syrup Take 5 mLs by mouth 4 (four) times daily as needed. 12/29/23  Yes Stuart Vernell Norris, PA-C  carvedilol  (COREG )  6.25 MG tablet Take 1 tablet (6.25 mg total) by mouth 2 (two) times daily with a meal. Take 1 tablet daily for 1 week and monitor BP at home, if tolerated increase to 1 tablet twice daily 08/03/23 11/01/23  Ahmed, Muhammad F, MD  cetirizine (ZYRTEC) 10 MG chewable tablet Chew 10 mg by mouth daily.    [provider]  clindamycin (CLEOCIN) 150 MG capsule Take 150  mg by mouth in the morning and at bedtime.    [provider]  cyanocobalamin  (VITAMIN B12) 500 MCG tablet Take 500 mcg by mouth daily.    [provider]  empagliflozin  (JARDIANCE ) 10 MG TABS tablet Take 10 mg by mouth daily.    [provider]  escitalopram (LEXAPRO) 5 MG tablet Take 2.5 mg by mouth daily.    [provider]  fluticasone  (FLONASE ) 50 MCG/ACT nasal spray Place 2 sprays into both nostrils daily. 09/25/17   Gretta Ozell CROME, PA-C  LORazepam  (ATIVAN ) 1 MG tablet Take 1 tablet (1 mg total) by mouth daily as needed for anxiety. No refills until 09/29/2017 09/25/17   Gretta Ozell CROME, PA-C  Multiple Vitamin (MULTIVITAMIN WITH MINERALS) TABS tablet Take 1 tablet by mouth daily.    [provider]  pantoprazole  (PROTONIX ) 40 MG tablet TAKE 1 TABLET(40 MG) BY MOUTH DAILY 10/04/23   Ahmed, Deatrice FALCON, MD    Family History Family History  Problem Relation Age of Onset   Dementia Mother    Healthy Father     Social History Social History   Tobacco Use   Smoking status: Former    Types: Cigars, Cigarettes   Smokeless tobacco: Never   Tobacco comments:    1 cigar occasion  Vaping Use   Vaping status: Never Used  Substance Use Topics   Alcohol use: Yes    Comment: occasionally   Drug use: Yes    Types: Marijuana    Comment: rare     Allergies   Statins, Codeine, Sulfonamide derivatives, Other, Pedi-pre tape spray [wound dressing adhesive], Penicillins, Silicone, and Tape   Review of Systems Review of Systems Per HPI  Physical Exam Triage Vital Signs ED Triage Vitals  Encounter Vitals Group     BP 12/29/23 0825 (!) 114/54     Girls Systolic BP Percentile --      Girls Diastolic BP Percentile --      Boys Systolic BP Percentile --      Boys Diastolic BP Percentile --      Pulse Rate 12/29/23 0825 73     Resp 12/29/23 0825 16     Temp 12/29/23 0825 98.2 F (36.8 C)     Temp Source 12/29/23 0825 Oral     SpO2 12/29/23  0825 92 %     Weight --      Height --      Head Circumference --      Peak Flow --      Pain Score 12/29/23 0823 4     Pain Loc --      Pain Education --      Exclude from Growth Chart --    No data found.  Updated Vital Signs BP (!) 114/54 (BP Location: Right Arm)   Pulse 73   Temp 98.2 F (36.8 C) (Oral)   Resp 16   SpO2 92%   Visual Acuity Right Eye Distance:   Left Eye Distance:   Bilateral Distance:    Right Eye Near:   Left  Eye Near:    Bilateral Near:     Physical Exam Vitals and nursing note reviewed.  Constitutional:      Appearance: Normal appearance.  HENT:     Head: Atraumatic.     Right Ear: Tympanic membrane and external ear normal.     Left Ear: Tympanic membrane and external ear normal.     Nose: Rhinorrhea present.     Mouth/Throat:     Mouth: Mucous membranes are moist.     Pharynx: Posterior oropharyngeal erythema present.  Eyes:     Extraocular Movements: Extraocular movements intact.     Conjunctiva/sclera: Conjunctivae normal.  Cardiovascular:     Rate and Rhythm: Normal rate and regular rhythm.     Heart sounds: Normal heart sounds.  Pulmonary:     Effort: Pulmonary effort is normal.     Breath sounds: Normal breath sounds. No wheezing or rales.  Musculoskeletal:        General: Normal range of motion.     Cervical back: Normal range of motion and neck supple.  Skin:    General: Skin is warm and dry.  Neurological:     Mental Status: She is alert and oriented to person, place, and time.  Psychiatric:        Mood and Affect: Mood normal.        Thought Content: Thought content normal.      UC Treatments / Results  Labs (all labs ordered are listed, but only abnormal results are displayed) Labs Reviewed - No data to display  EKG   Radiology DG Chest 2 View Result Date: 12/29/2023 EXAM: 2 VIEW(S) XRAY OF THE CHEST 12/29/2023 09:12:09 AM COMPARISON: None available. CLINICAL HISTORY: productive cough x 5 days FINDINGS:  LUNGS AND PLEURA: Biapical pleural thickening. No focal pulmonary opacity. No pleural effusion. No pneumothorax. HEART AND MEDIASTINUM: No acute abnormality of the cardiac and mediastinal silhouettes. BONES AND SOFT TISSUES: No acute osseous abnormality. IMPRESSION: 1. No acute cardiopulmonary abnormality. Electronically signed by: Rogelia Myers MD 12/29/2023 09:21 AM EST RP Workstation: [id number]    Procedures Procedures (including critical care time)  Medications Ordered in UC Medications  dexamethasone (DECADRON) injection 10 mg (10 mg Intramuscular Given 12/29/23 0949)    Initial Impression / Assessment and Plan / UC Course  I have reviewed the triage vital signs and the nursing notes.  Pertinent labs & imaging results that were available during my care of the patient were reviewed by me and considered in my medical decision making (see chart for details).     Suspect viral bronchitis, chest x-ray today showing no acute cardiopulmonary abnormality, vitals and exam very reassuring.  Will treat with IM Decadron, Phenergan DM, supportive over-the-counter medications and home care.  Return for worsening or unresolving symptoms.  Final Clinical Impressions(s) / UC Diagnoses   Final diagnoses:  Acute cough  Viral bronchitis     Discharge Instructions      In addition to the prescribed medications/steroid injection given, I recommend continuing the Astelin  twice daily, antihistamines, trying Coricidin HBP as needed, saline sinus rinses several times daily as needed, humidifiers, drinking plenty of fluids.  Follow-up for worsening or unresolving symptoms.    ED Prescriptions     Medication Sig Dispense Auth. Provider   promethazine-dextromethorphan (PROMETHAZINE-DM) 6.25-15 MG/5ML syrup Take 5 mLs by mouth 4 (four) times daily as needed. 100 mL Stuart Vernell Norris, [place]      PDMP not reviewed this encounter.   Stuart Vernell Norris, [place] 12/31/23  0809  

## 2024-01-03 ENCOUNTER — Encounter (INDEPENDENT_AMBULATORY_CARE_PROVIDER_SITE_OTHER): Payer: Self-pay | Admitting: *Deleted

## 2024-03-05 ENCOUNTER — Telehealth (INDEPENDENT_AMBULATORY_CARE_PROVIDER_SITE_OTHER): Payer: Self-pay

## 2024-03-05 NOTE — Telephone Encounter (Signed)
 Who is your primary care physician: Ozell Gaskins, PA  Are you diabetic? If yes, Type 1 or Type 2?    Yes, type 2  Do you have a prosthetic or mechanical heart valve? no  Do you have a pacemaker/defibrillator?   no  Have you had endocarditis/atrial fibrillation? no  Have you had joint replacement within the last 12 months?  no  Do you tend to be constipated or have to use laxatives? no  Do you have any history of drugs or alcohol?  no  Do you use supplemental oxygen?  no  Have you had a stroke or heart attack within the last 6 months? no  Do you take weight loss medication?  no  For female patients: have you had a hysterectomy?  Left blank                                     are you post menopausal?       yes                                            do you still have your menstrual cycle? Left blank      Do you take any blood-thinning medications such as: (aspirin, warfarin, Plavix, Aggrenox)  no  If yes we need the name, milligram, dosage and who is prescribing doctor   Current Outpatient Medications  Medication Sig Dispense Refill   carvedilol  (COREG ) 6.25 MG tablet Take 1 tablet (6.25 mg total) by mouth 2 (two) times daily with a meal. Take 1 tablet daily for 1 week and monitor BP at home, if tolerated increase to 1 tablet twice daily 60 tablet 3   cyanocobalamin  (VITAMIN B12) 500 MCG tablet Take 500 mcg by mouth daily.     empagliflozin  (JARDIANCE ) 10 MG TABS tablet Take 10 mg by mouth daily.     escitalopram (LEXAPRO) 5 MG tablet Take 2.5 mg by mouth daily.     fluticasone  (FLONASE ) 50 MCG/ACT nasal spray Place 2 sprays into both nostrils daily. 16 g 6   glipiZIDE (GLUCOTROL XL) 2.5 MG 24 hr tablet Take 2.5 mg by mouth daily with breakfast.     LORazepam  (ATIVAN ) 1 MG tablet Take 1 tablet (1 mg total) by mouth daily as needed for anxiety. No refills until 09/29/2017 90 tablet 1   Multiple Vitamin (MULTIVITAMIN WITH MINERALS) TABS tablet Take 1 tablet by mouth daily.      pantoprazole  (PROTONIX ) 40 MG tablet TAKE 1 TABLET(40 MG) BY MOUTH DAILY 90 tablet 3   promethazine -dextromethorphan (PROMETHAZINE -DM) 6.25-15 MG/5ML syrup Take 5 mLs by mouth 4 (four) times daily as needed. 100 mL 0   No current facility-administered medications for this visit.    Allergies[1]  Best number where you can be reached: [phone] or [phone]     [1]  Allergies Allergen Reactions   Statins Rash   Codeine    Sulfonamide Derivatives    Other Rash and Other (See Comments)    Thermasol - active ingredient in the flu shot -- made eyes swell up  Statins - affects muscles in legs  Adhesive Tape;  Blisters   Adhesive Tape;  Blisters   Adhesive Tape;  Blisters   Pedi-Pre Tape Spray [Wound Dressing Adhesive] Other (See Comments)    Adhesive Tape;  Blisters    Penicillins Rash   Silicone Rash    Blisters  Adhesive Tape;  Blisters   Adhesive Tape;  Blisters   Blisters  Adhesive Tape;  Blisters   Blisters  Adhesive Tape;  Blisters   Blisters, Adhesive Tape;  Blisters   Blisters Adhesive Tape;  Blisters     Blisters Adhesive Tape;  Blisters  Adhesive Tape;  Blisters  Blisters Adhesive Tape;  Blisters  Blisters Adhesive Tape;  Blisters     Blisters, Adhesive Tape;  Blisters   Tape Rash and Other (See Comments)    Adhesive Tape;  Blisters  Blisters Adhesive Tape;  Blisters

## 2024-03-06 NOTE — Telephone Encounter (Signed)
 Ok to schedule.  Room Any ============  Hi Betty Gonzalez,  Can you please schedule a follow up appointment for this patient in 4-6 weeks with me or any of the APPs?  Diagnosis : Cirrhosis   Thanks,  Myya Meenach Faizan Meleni Delahunt , MD Gastroenterology and Hepatology [facility]

## 2024-03-07 NOTE — Telephone Encounter (Signed)
LMOVM to call back to schedule EGD

## 2024-03-15 NOTE — Telephone Encounter (Signed)
 ATC patient to schedule EGD, no answer. LVM for call back.
